# Patient Record
Sex: Male | Born: 1965 | Race: White | Hispanic: No | Marital: Married | Smoking: Former smoker
Health system: Southern US, Community
[De-identification: ages and names within clinical notes are randomized; demographics above are authoritative.]

## PROBLEM LIST (undated history)

## (undated) DIAGNOSIS — I10 Essential (primary) hypertension: Secondary | ICD-10-CM

## (undated) DIAGNOSIS — F419 Anxiety disorder, unspecified: Secondary | ICD-10-CM

## (undated) DIAGNOSIS — E785 Hyperlipidemia, unspecified: Secondary | ICD-10-CM

## (undated) DIAGNOSIS — K269 Duodenal ulcer, unspecified as acute or chronic, without hemorrhage or perforation: Secondary | ICD-10-CM

## (undated) DIAGNOSIS — T7840XA Allergy, unspecified, initial encounter: Secondary | ICD-10-CM

## (undated) HISTORY — DX: Allergy, unspecified, initial encounter: T78.40XA

## (undated) HISTORY — DX: Anxiety disorder, unspecified: F41.9

## (undated) HISTORY — DX: Duodenal ulcer, unspecified as acute or chronic, without hemorrhage or perforation: K26.9

## (undated) HISTORY — DX: Hyperlipidemia, unspecified: E78.5

## (undated) HISTORY — DX: Essential (primary) hypertension: I10

## (undated) HISTORY — PX: KNEE ARTHROPLASTY: SHX992

## (undated) HISTORY — PX: REPLACEMENT TOTAL KNEE: SUR1224

---

## 2000-08-17 HISTORY — PX: KIDNEY STONE SURGERY: SHX686

## 2004-05-29 ENCOUNTER — Ambulatory Visit: Payer: Self-pay | Admitting: Urology

## 2004-06-25 ENCOUNTER — Ambulatory Visit: Payer: Self-pay | Admitting: Internal Medicine

## 2005-04-02 ENCOUNTER — Ambulatory Visit: Payer: Self-pay | Admitting: Family Medicine

## 2005-07-15 ENCOUNTER — Ambulatory Visit: Payer: Self-pay | Admitting: Family Medicine

## 2006-01-27 ENCOUNTER — Ambulatory Visit: Payer: Self-pay | Admitting: Family Medicine

## 2006-07-02 ENCOUNTER — Ambulatory Visit: Payer: Self-pay | Admitting: Family Medicine

## 2006-09-06 ENCOUNTER — Ambulatory Visit: Payer: Self-pay | Admitting: Family Medicine

## 2006-10-29 ENCOUNTER — Ambulatory Visit: Payer: Self-pay | Admitting: Family Medicine

## 2006-11-08 ENCOUNTER — Ambulatory Visit: Payer: Self-pay | Admitting: Family Medicine

## 2007-02-22 ENCOUNTER — Encounter: Payer: Self-pay | Admitting: Family Medicine

## 2007-02-22 DIAGNOSIS — F341 Dysthymic disorder: Secondary | ICD-10-CM | POA: Insufficient documentation

## 2007-02-22 DIAGNOSIS — T7840XA Allergy, unspecified, initial encounter: Secondary | ICD-10-CM | POA: Insufficient documentation

## 2007-02-22 DIAGNOSIS — F419 Anxiety disorder, unspecified: Secondary | ICD-10-CM | POA: Insufficient documentation

## 2007-02-23 ENCOUNTER — Ambulatory Visit: Payer: Self-pay | Admitting: Family Medicine

## 2007-10-10 ENCOUNTER — Other Ambulatory Visit: Payer: Self-pay

## 2007-10-10 ENCOUNTER — Observation Stay: Payer: Self-pay | Admitting: Internal Medicine

## 2007-10-10 DIAGNOSIS — I10 Essential (primary) hypertension: Secondary | ICD-10-CM | POA: Insufficient documentation

## 2007-10-11 ENCOUNTER — Encounter: Payer: Self-pay | Admitting: Family Medicine

## 2007-10-12 ENCOUNTER — Ambulatory Visit: Payer: Self-pay | Admitting: Family Medicine

## 2007-10-12 DIAGNOSIS — R7309 Other abnormal glucose: Secondary | ICD-10-CM | POA: Insufficient documentation

## 2007-10-12 DIAGNOSIS — E785 Hyperlipidemia, unspecified: Secondary | ICD-10-CM | POA: Insufficient documentation

## 2007-10-13 ENCOUNTER — Encounter: Payer: Self-pay | Admitting: Family Medicine

## 2007-11-21 ENCOUNTER — Ambulatory Visit: Payer: Self-pay | Admitting: Family Medicine

## 2007-11-21 LAB — CONVERTED CEMR LAB
BUN: 16 mg/dL (ref 6–23)
CO2: 30 meq/L (ref 19–32)
Calcium: 8.6 mg/dL (ref 8.4–10.5)
Chloride: 108 meq/L (ref 96–112)
Creatinine, Ser: 0.9 mg/dL (ref 0.4–1.5)
GFR calc Af Amer: 118 mL/min
GFR calc non Af Amer: 98 mL/min
Glucose, Bld: 89 mg/dL (ref 70–99)
Potassium: 4.3 meq/L (ref 3.5–5.1)
Sodium: 141 meq/L (ref 135–145)

## 2007-12-02 ENCOUNTER — Telehealth: Payer: Self-pay | Admitting: Family Medicine

## 2007-12-09 ENCOUNTER — Ambulatory Visit: Payer: Self-pay | Admitting: Family Medicine

## 2008-01-11 ENCOUNTER — Telehealth (INDEPENDENT_AMBULATORY_CARE_PROVIDER_SITE_OTHER): Payer: Self-pay | Admitting: *Deleted

## 2008-03-19 ENCOUNTER — Telehealth (INDEPENDENT_AMBULATORY_CARE_PROVIDER_SITE_OTHER): Payer: Self-pay | Admitting: *Deleted

## 2008-04-30 ENCOUNTER — Telehealth: Payer: Self-pay | Admitting: Family Medicine

## 2008-06-28 ENCOUNTER — Ambulatory Visit: Payer: Self-pay | Admitting: Family Medicine

## 2008-07-26 ENCOUNTER — Ambulatory Visit: Payer: Self-pay | Admitting: Family Medicine

## 2008-08-17 LAB — HM HEPATITIS C SCREENING LAB: HM Hepatitis Screen: NEGATIVE

## 2008-08-17 LAB — HM HIV SCREENING LAB: HM HIV Screening: NEGATIVE

## 2009-02-04 ENCOUNTER — Ambulatory Visit: Payer: Self-pay | Admitting: Family Medicine

## 2009-02-25 ENCOUNTER — Telehealth: Payer: Self-pay | Admitting: Family Medicine

## 2009-02-27 ENCOUNTER — Ambulatory Visit: Payer: Self-pay | Admitting: Family Medicine

## 2009-03-29 ENCOUNTER — Ambulatory Visit: Payer: Self-pay | Admitting: Family Medicine

## 2009-04-01 ENCOUNTER — Ambulatory Visit: Payer: Self-pay | Admitting: Cardiology

## 2009-07-26 ENCOUNTER — Telehealth: Payer: Self-pay | Admitting: Family Medicine

## 2009-09-02 ENCOUNTER — Telehealth: Payer: Self-pay | Admitting: Family Medicine

## 2009-09-05 ENCOUNTER — Ambulatory Visit: Payer: Self-pay | Admitting: Family Medicine

## 2009-09-26 ENCOUNTER — Ambulatory Visit: Payer: Self-pay | Admitting: Family Medicine

## 2010-01-29 ENCOUNTER — Telehealth: Payer: Self-pay | Admitting: Family Medicine

## 2010-03-25 ENCOUNTER — Encounter (INDEPENDENT_AMBULATORY_CARE_PROVIDER_SITE_OTHER): Payer: Self-pay | Admitting: *Deleted

## 2010-07-16 ENCOUNTER — Ambulatory Visit: Payer: Self-pay | Admitting: Family Medicine

## 2010-07-23 ENCOUNTER — Telehealth: Payer: Self-pay | Admitting: Family Medicine

## 2010-08-01 ENCOUNTER — Ambulatory Visit: Payer: Self-pay | Admitting: Family Medicine

## 2010-09-12 ENCOUNTER — Other Ambulatory Visit: Payer: Self-pay | Admitting: Family Medicine

## 2010-09-12 ENCOUNTER — Ambulatory Visit
Admission: RE | Admit: 2010-09-12 | Discharge: 2010-09-12 | Payer: Self-pay | Source: Home / Self Care | Attending: Family Medicine | Admitting: Family Medicine

## 2010-09-12 ENCOUNTER — Encounter: Payer: Self-pay | Admitting: Family Medicine

## 2010-09-12 LAB — LDL CHOLESTEROL, DIRECT: Direct LDL: 165.2 mg/dL

## 2010-09-12 LAB — RENAL FUNCTION PANEL
Albumin: 3.9 g/dL (ref 3.5–5.2)
BUN: 23 mg/dL (ref 6–23)
CO2: 29 mEq/L (ref 19–32)
Calcium: 8.6 mg/dL (ref 8.4–10.5)
Chloride: 104 mEq/L (ref 96–112)
Creatinine, Ser: 0.8 mg/dL (ref 0.4–1.5)
GFR: 107.45 mL/min (ref >60.00)
Glucose, Bld: 85 mg/dL (ref 70–99)
Phosphorus: 3.5 mg/dL (ref 2.3–4.6)
Potassium: 4.5 mEq/L (ref 3.5–5.1)
Sodium: 138 mEq/L (ref 135–145)

## 2010-09-12 LAB — CBC WITH DIFFERENTIAL/PLATELET
Basophils Absolute: 0 10*3/uL (ref 0.0–0.1)
Basophils Relative: 0.5 % (ref 0.0–3.0)
Eosinophils Absolute: 0.1 10*3/uL (ref 0.0–0.7)
Eosinophils Relative: 2.7 % (ref 0.0–5.0)
HCT: 41.8 % (ref 39.0–52.0)
Hemoglobin: 14.5 g/dL (ref 13.0–17.0)
Lymphocytes Relative: 39.3 % (ref 12.0–46.0)
Lymphs Abs: 2 10*3/uL (ref 0.7–4.0)
MCHC: 34.8 g/dL (ref 30.0–36.0)
MCV: 96.7 fl (ref 78.0–100.0)
Monocytes Absolute: 0.4 10*3/uL (ref 0.1–1.0)
Monocytes Relative: 7.1 % (ref 3.0–12.0)
Neutro Abs: 2.5 10*3/uL (ref 1.4–7.7)
Neutrophils Relative %: 50.4 % (ref 43.0–77.0)
Platelets: 159 10*3/uL (ref 150.0–400.0)
RBC: 4.32 Mil/uL (ref 4.22–5.81)
RDW: 11.9 % (ref 11.5–14.6)
WBC: 5 10*3/uL (ref 4.5–10.5)

## 2010-09-12 LAB — HEPATIC FUNCTION PANEL
ALT: 14 U/L (ref 0–53)
AST: 23 U/L (ref 0–37)
Albumin: 3.9 g/dL (ref 3.5–5.2)
Alkaline Phosphatase: 63 U/L (ref 39–117)
Bilirubin, Direct: 0.1 mg/dL (ref 0.0–0.3)
Total Bilirubin: 1.5 mg/dL — ABNORMAL HIGH (ref 0.3–1.2)
Total Protein: 6.2 g/dL (ref 6.0–8.3)

## 2010-09-12 LAB — LIPID PANEL
Cholesterol: 234 mg/dL — ABNORMAL HIGH (ref 0–200)
HDL: 45.6 mg/dL (ref >39.00)
Total CHOL/HDL Ratio: 5
Triglycerides: 133 mg/dL (ref 0.0–149.0)
VLDL: 26.6 mg/dL (ref 0.0–40.0)

## 2010-09-12 LAB — MICROALBUMIN / CREATININE URINE RATIO
Creatinine,U: 109.5 mg/dL
Microalb Creat Ratio: 0.5 mg/g (ref 0.0–30.0)
Microalb, Ur: 0.5 mg/dL (ref 0.0–1.9)

## 2010-09-12 LAB — TSH: TSH: 1.3 u[IU]/mL (ref 0.35–5.50)

## 2010-09-14 LAB — CONVERTED CEMR LAB: Creatinine, Ser: 0.8 mg/dL (ref 0.4–1.5)

## 2010-09-16 LAB — CONVERTED CEMR LAB: Vit D, 25-Hydroxy: 30 ng/mL (ref 30–89)

## 2010-09-16 NOTE — Assessment & Plan Note (Signed)
Summary: MED REFILL/DLO   Vital Signs:  Patient profile:   45 year old male Height:      67.75 inches Weight:      239.25 pounds BMI:     36.78 Temp:     98.9 degrees F oral Pulse rate:   68 / minute Pulse rhythm:   regular BP sitting:   132 / 80  (left arm) Cuff size:   large  Vitals Entered By: Linde Gillis CMA Duncan Dull) (July 16, 2010 8:14 AM) CC: medication refill   History of Present Illness: Pt here for followup for medication refill. He feels good after taking off a few days. His right knee bothers him. He does lots of hunting and bothers him when doing that. He also has allergies that sometimes bother him in the woods...he takes OTC allegra with good results. He uses IBP one daily. His hands hurt him, He has one spot on his right middle finger. He opened it up and got discharge. It is now healed over. He still takes Alprazolam infrequently for "when I'm about to blow"  not very frequently. His script for this is years old.  Problems Prior to Update: 1)  Rotator Cuff Injury, Left Shoulder  (ICD-726.10) 2)  Elbow Pain, Left  (ICD-719.42) 3)  Cough  (ICD-786.2) 4)  Lung Nodule Lll  (ICD-518.89) 5)  Uri  (ICD-465.9) 6)  Tick Bite  (ICD-E906.4) 7)  Chest Pain  (ICD-786.50) 8)  L Ankle Dislocation  (ICD-755.69) 9)  Hyperlipidemia  (ICD-272.4) 10)  Hyperglycemia  (ICD-790.29) 11)  Essential Hypertension, Benign  (ICD-401.1) 12)  Sprain/strain, Lumbosacral  (ICD-846.0) 13)  Allergy  (ICD-995.3) 14)  Anxiety Depression  (ICD-300.4) 15)  Symptoms/syndromes Nec/nos, Special (AGITATION)  (ICD-307.9)  Medications Prior to Update: 1)  Xanax 0.25 Mg Tabs (Alprazolam) .Marland Kitchen.. 1 Every 6 Hours As Needed Anxiety By Mouth 2)  Fluoxetine Hcl 20 Mg  Caps (Fluoxetine Hcl) .Marland Kitchen.. 1 Daily By Mouth Once Daily 3)  Allegra 180 Mg  Tabs (Fexofenadine Hcl) .Marland Kitchen.. 1 Tablet Daily By Mouth 4)  Lisinopril 5 Mg  Tabs (Lisinopril) .... Take 1 Tablet By Mouth Once A Day 5)  Ibuprofen 800 Mg Tabs  (Ibuprofen) .Marland Kitchen.. 1 By Mouth Three Times A Day As Needed Pain 6)  Vicodin 5-500 Mg Tabs (Hydrocodone-Acetaminophen) .... One Tab By Mouth At Night For Pain  Current Medications (verified): 1)  Xanax 0.25 Mg Tabs (Alprazolam) .Marland Kitchen.. 1 Every 6 Hours As Needed Anxiety By Mouth 2)  Fluoxetine Hcl 20 Mg  Caps (Fluoxetine Hcl) .Marland Kitchen.. 1 Daily By Mouth Once Daily 3)  Allegra 180 Mg  Tabs (Fexofenadine Hcl) .Marland Kitchen.. 1 Tablet Daily By Mouth 4)  Lisinopril 5 Mg  Tabs (Lisinopril) .... Take 1 Tablet By Mouth Once A Day 5)  Ibuprofen 800 Mg Tabs (Ibuprofen) .Marland Kitchen.. 1 By Mouth Three Times A Day As Needed Pain  Allergies: 1)  ! Codeine Sulfate 2)  ! Toradol  Physical Exam  General:  Well-developed,well-nourished,in no acute distress; alert,appropriate and cooperative throughout examination, mildly obese. Head:  Normocephalic and atraumatic without obvious abnormalities. No apparent alopecia or balding. Sinuses nontender. Eyes:  Conjunctiva clear bilaterally.  Ears:  External ear exam shows no significant lesions or deformities.  Otoscopic examination reveals clear canals, tympanic membranes are intact bilaterally without bulging, retraction, inflammation or discharge. Hearing is grossly normal bilaterally. Nose:  Nares minimally inflamed. Mouth:  Oral mucosa and oropharynx without lesions or exudates.  Teeth in good repair.  Neck:  No deformities, masses, or tenderness  noted. Lungs:  Normal respiratory effort, chest expands symmetrically. Lungs are clear to auscultation, no crackles or wheezes. Heart:  Normal rate and regular rhythm. S1 and S2 normal without gallop, murmur, click, rub or other extra sounds.   Impression & Recommendations:  Problem # 1:  ESSENTIAL HYPERTENSION, BENIGN (ICD-401.1) Assessment Unchanged  His updated medication list for this problem includes:    Lisinopril 5 Mg Tabs (Lisinopril) .Marland Kitchen... Take 1 tablet by mouth once a day  BP today: 132/80 Prior BP: 120/82 (09/26/2009)  Labs  Reviewed: K+: 4.3 (11/21/2007) Creat: : 0.8 (03/29/2009)     Problem # 2:  ROTATOR CUFF INJURY, LEFT SHOULDER (ICD-726.10) Assessment: Improved Shoulder back to normal.  Problem # 3:  ALLERGY (ICD-995.3) Assessment: Unchanged Stable when takes allegra when needed.  Problem # 4:  ANXIETY DEPRESSION (ICD-300.4) Assessment: Unchanged Has occas exacerbations but he feels Prozac doing good job on daily basis with occas Xanax as needed. Discussed habituation.  Complete Medication List: 1)  Xanax 0.25 Mg Tabs (Alprazolam) .Marland Kitchen.. 1 every 6 hours as needed anxiety by mouth 2)  Fluoxetine Hcl 20 Mg Caps (Fluoxetine hcl) .Marland Kitchen.. 1 daily by mouth once daily 3)  Allegra 180 Mg Tabs (Fexofenadine hcl) .Marland Kitchen.. 1 tablet daily by mouth 4)  Lisinopril 5 Mg Tabs (Lisinopril) .... Take 1 tablet by mouth once a day 5)  Ibuprofen 800 Mg Tabs (Ibuprofen) .Marland Kitchen.. 1 by mouth three times a day as needed pain  Patient Instructions: 1)  RTC for Comp Exam after the first of the year, labs prior Prescriptions: XANAX 0.25 MG TABS (ALPRAZOLAM) 1 every 6 hours as needed anxiety by mouth  #30 x 0   Entered and Authorized by:   Shaune Leeks MD   Signed by:   Shaune Leeks MD on 07/16/2010   Method used:   Print then Give to Patient   RxID:   [id number] LISINOPRIL 5 MG  TABS (LISINOPRIL) Take 1 tablet by mouth once a day  #30 Tablet x 11   Entered and Authorized by:   Shaune Leeks MD   Signed by:   Shaune Leeks MD on 07/16/2010   Method used:   Electronically to        [facility]. [address] [phone]* (retail)       [address] [place], [place]  [address]       Ph: [phone]       Fax: [phone]   RxID:   [id number] FLUOXETINE HCL 20 MG  CAPS (FLUOXETINE HCL) 1 daily by mouth once daily  #30 Capsule x 11   Entered and Authorized by:   Shaune Leeks MD   Signed by:   Shaune Leeks MD on 07/16/2010   Method used:   Electronically to        [facility].  [address] [phone]* (retail)       [address] [place], [place]  [id number]       Ph: [phone]       Fax: [phone]   RxID:   [id number]    Orders Added: 1)  Est. Patient Level III [61607]    Current Allergies (reviewed today): ! CODEINE SULFATE ! TORADOL

## 2010-09-16 NOTE — Assessment & Plan Note (Signed)
Summary: Larey Seat, injured elbow, needs x-ray /lsf   Vital Signs:  Patient profile:   45 year old male Height:      67.75 inches Weight:      236.4 pounds BMI:     36.34 Temp:     98.5 degrees F oral Pulse rate:   64 / minute Pulse rhythm:   regular BP sitting:   120 / 82  (left arm) Cuff size:   regular  Vitals Entered By: Benny Lennert CMA Duncan Dull) (September 05, 2009 3:08 PM)  History of Present Illness: Chief complaint left arm pain after fall  Left arm pain:  Was up on a roof take, was holding a slip knowt and malfunction, and reports that the rope was around his arm.   Concrete broke loose and slip knot broke and he lunged forward and hit at the same time. Wedge his elbow and his knee.   Felt like he pulled his bicep and tricep got pulled   now he is having pain with abducting and flexing his arm, some pain with motion  at the elbow minimally, and also has an abrasion at his elbow.  He also has some mild swelling around this abrasion.  The patient has full motion at the elbow.  He has no complaints of pain in his hand or wrist, has minimal or no pain in his knee.  He denies prior operative intervention in the affected extremity.  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  Allergies: 1)  ! Codeine Sulfate 2)  ! Toradol  Past History:  Past medical, surgical, family and social histories (including risk factors) reviewed, and no changes noted (except as noted below).  Past Medical History: ALLERGY (ICD-995.3) ANXIETY DEPRESSION (ICD-300.4)  Past Surgical History: Reviewed history from 10/13/2007 and no changes required. Hemmoroidectomy  2001 KIDNEY STONES X 2  CYSTOSCOPY W/STENT last 2002 HOSP [facility] CP R/O'D HTN HEADACHE HYPERGLYCEMIA OBESITY ^CHOL ANXIETY 2/23-2/24/2009  Family History: Reviewed history from 02/22/2007 and no changes required. Father: DECEASED 2 YOA CANCER OF  LUNG (SMOKER) Mother: ALIVE 77 YOA = ALLERGIES Siblings: 1 BROTHER ALIVE 2 SISTERS ? OLDEST SISTER : CAD / HTN CV:+ OLDEST SISTER HBP: + OLDEST SISTER DM: NEGATIVE GOUT/ARTHRITIS:  PROSTATE CANCER: + FATHER LUNG BREAST/OVARIAN/UTERINE CANCER:  COLON: CANCER: DEPRESSION: NEGATIVE ETOH/DRUG ABUSE: NEGATIVE OTHER: NEGATIVE STROKE  Social History: Reviewed history from 02/22/2007 and no changes required. Marital Status: MarriedLIVES WITH WIFE Children: 3 CHILDREN Occupation: SUPER: Product/process development scientist  Review of Systems       REVIEW OF SYSTEMS  GEN: No systemic complaints, no fevers, chills, sweats, or other acute illnesses MSK: Detailed in the HPI GI: tolerating PO intake without difficulty Neuro: No numbness, parasthesias, or tingling associated. Otherwise the pertinent positives of the ROS are noted above.    Physical Exam  General:  GEN: Well-developed,well-nourished,in no acute distress; alert,appropriate and cooperative throughout examination HEENT: Normocephalic and atraumatic without obvious abnormalities. No apparent alopecia or balding. Ears, externally no deformities PULM: Breathing comfortably in no respiratory distress EXT: No clubbing, cyanosis, or edema PSYCH: Normally interactive. Cooperative during the interview. Pleasant. Friendly and conversant. Not anxious or depressed appearing. Normal, full affect.  Msk:  right-sided upper extremity, full range of motion, nontender throughout, strength 5/5 at the shoulder, elbow, hand and wrist.  Left hand and wrist: Nontender throughout all bony anatomy, full range of motion, good grip  and 5/5 strength testing throughout.  Nontender throughout palpation of the radius and ulna.  Elbow: there is an abrasion on the left olecranon. This is surrounded by some mild swelling.  Patient has full range of motion  at the elbow with flexion and extension as well as supination and pronation. Nontender at the radial head. Nontender at  the olecranon. NontenderECRB., medial and lateral epicondyles  less shoulder: Mildly tender in the bicipital groove, tender at the supraspinous insertion.  There is some tenderness with  range of motion in abduction and flexion plane, however full range of motion is maintained.   Negative drop test. Positive Neer test,  positive Leanord Asal test. Negative a.c. crossover compression test. Positive speed's test   Positive Jobe test.  Strength testing: Abduction 4+/5, external rotation, and internal rotation 4+/5. These maneuvers cause some mild pain.  There is no bruising throughout. Neurologic:  neurovascularly intact   Impression & Recommendations:  Problem # 1:  ROTATOR CUFF INJURY, LEFT SHOULDER (ICD-726.10) Assessment New I suspect he has a mild, minor rotator cuff tear. He has no strength deficit and a negative drop test.  Would limit his heavy lifting for about 3 weeks, and I gave him some activities for maintaining his range of motion.  I suspect he will do well with time with conservative management  Time spent in eval, discussion of anatomy, work limitations, recovery, rehab  Problem # 2:  ELBOW PAIN, LEFT (JYN-829.56) Assessment: New Clinically there is not fracture  suspect bony contusion as well as soft tissue contusion at elbow  Complete Medication List: 1)  Xanax 0.25 Mg Tabs (Alprazolam) .Marland Kitchen.. 1 every 6 hours as needed anxiety by mouth 2)  Fluoxetine Hcl 20 Mg Caps (Fluoxetine hcl) .Marland Kitchen.. 1 daily by mouth once daily 3)  Allegra 180 Mg Tabs (Fexofenadine hcl) .Marland Kitchen.. 1 tablet daily by mouth 4)  Lisinopril 5 Mg Tabs (Lisinopril) .... Take 1 tablet by mouth once a day 5)  Hydrocodone-acetaminophen 5-500 Mg Tabs (Hydrocodone-acetaminophen) .Marland Kitchen.. 1 by mouth q 6 hours as needed pain 6)  Ibuprofen 800 Mg Tabs (Ibuprofen) .Marland Kitchen.. 1 by mouth three times a day as needed pain Prescriptions: IBUPROFEN 800 MG TABS (IBUPROFEN) 1 by mouth three times a day as needed pain  #90  x 0   Entered and Authorized by:   Hannah Beat MD   Signed by:   Hannah Beat MD on 09/05/2009   Method used:   Print then Give to Patient   RxID:   [id number] HYDROCODONE-ACETAMINOPHEN 5-500 MG TABS (HYDROCODONE-ACETAMINOPHEN) 1 by mouth q 6 hours as needed pain  #30 x 0   Entered and Authorized by:   Hannah Beat MD   Signed by:   Hannah Beat MD on 09/05/2009   Method used:   Print then Give to Patient   RxID:   [id number]   Current Allergies (reviewed today): ! CODEINE SULFATE ! TORADOL

## 2010-09-16 NOTE — Letter (Signed)
Summary: Out of Work  [employer] [employer] [facility] at [facility]  [address] [place], [place] [address]   Phone: [phone]  Fax: [phone]    September 05, 2009   Employee:  ERICSON NAFZIGER    To Whom It May Concern:   For Medical reasons, limit Mr. Celani lifting at work to 3 pounds or less, but he may remain active in other activities at work, walk, supervise, as long as no upper extremity stress.  Start:   09/05/2009  End:   09/26/2009  If you need additional information, please feel free to contact our office.         Sincerely,    Hannah Beat MD

## 2010-09-16 NOTE — Assessment & Plan Note (Signed)
Summary: LEFT SHOULDER PAIN/[facility]   Vital Signs:  Patient profile:   45 year old male Weight:      236 pounds Temp:     98.5 degrees F oral Pulse rate:   60 / minute Pulse rhythm:   regular BP sitting:   120 / 82  (right arm) Cuff size:   large  Vitals Entered By: Sydell Axon LPN (September 26, 2009 10:17 AM) CC: Left shoulder and neck pain, injured shoulder at work 3 weeks ago and saw Dr. Patsy Lager for this   History of Present Illness: Pt here to discuss his left shoulder which was injured a few weeks ago and for which he saw Dr Patsy Lager. He he was diagnosed with partial tear of the rotaator cuff and was given NSAIDS, pain medicine and exercises to do to avoid adhesive capsulitis while allowint the soft tissue to heal. 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 He has since had swelling of the area in the shoulder, pulsing sensations in the area and discomfort all at night after the activity. It makes sleeping difficult. He noticed during the day this all improved as his shopulder "warms up."  Problems Prior to Update: 1)  Rotator Cuff Injury, Left Shoulder  (ICD-726.10) 2)  Elbow Pain, Left  (ICD-719.42) 3)  Cough  (ICD-786.2) 4)  Lung Nodule Lll  (ICD-518.89) 5)  Uri  (ICD-465.9) 6)  Tick Bite  (ICD-E906.4) 7)  Chest Pain  (ICD-786.50) 8)  L Ankle Dislocation  (ICD-755.69) 9)  Hyperlipidemia  (ICD-272.4) 10)  Hyperglycemia  (ICD-790.29) 11)  Essential Hypertension, Benign  (ICD-401.1) 12)  Sprain/strain, Lumbosacral  (ICD-846.0) 13)  Allergy  (ICD-995.3) 14)  Anxiety Depression  (ICD-300.4) 15)  Symptoms/syndromes Nec/nos, Special (AGITATION)  (ICD-307.9)  Medications Prior to Update: 1)  Xanax 0.25 Mg Tabs (Alprazolam) .Marland Kitchen.. 1 Every 6 Hours As Needed Anxiety By Mouth 2)  Fluoxetine Hcl 20 Mg  Caps (Fluoxetine Hcl) .Marland Kitchen.. 1 Daily By Mouth Once Daily 3)  Allegra 180 Mg  Tabs  (Fexofenadine Hcl) .Marland Kitchen.. 1 Tablet Daily By Mouth 4)  Lisinopril 5 Mg  Tabs (Lisinopril) .... Take 1 Tablet By Mouth Once A Day 5)  Hydrocodone-Acetaminophen 5-500 Mg Tabs (Hydrocodone-Acetaminophen) .Marland Kitchen.. 1 By Mouth Q 6 Hours As Needed Pain 6)  Ibuprofen 800 Mg Tabs (Ibuprofen) .Marland Kitchen.. 1 By Mouth Three Times A Day As Needed Pain  Allergies: 1)  ! Codeine Sulfate 2)  ! Toradol  Physical Exam  General:  GEN: Well-developed,well-nourished,in no acute distress; alert,appropriate and cooperative throughout examination HEENT: Normocephalic and atraumatic without obvious abnormalities. No apparent alopecia or balding. Ears, externally no deformities PULM: Breathing comfortably in no respiratory distress EXT: No clubbing, cyanosis, or edema PSYCH: Normally interactive. Cooperative during the interview. Pleasant. Friendly and conversant. Not anxious or depressed appearing. Normal, full affect.  Head:  Normocephalic and atraumatic without obvious abnormalities. No apparent alopecia or balding. Sinuses nontender. Eyes:  Conjunctiva clear bilaterally.  Ears:  External ear exam shows no significant lesions or deformities.  Otoscopic examination reveals clear canals, tympanic membranes are intact bilaterally without bulging, retraction, inflammation or discharge. Hearing is grossly normal bilaterally. Nose:  Nares minimally inflamed. Mouth:  Oral mucosa and oropharynx without lesions or exudates.  Teeth in good repair.  Neck:  No deformities, masses, or tenderness noted. Lungs:  Normal respiratory effort, chest expands symmetrically. Lungs are clear to auscultation, no crackles or wheezes. Heart:  Normal rate and regular rhythm. S1 and S2 normal without gallop, murmur, click, rub or other extra sounds. Msk:  L shoulder for minimally decreased ROM but discomfort with extremes esp behind the back, no swelling or echynmosis noted.   Impression & Recommendations:  Problem # 1:  ROTATOR CUFF INJURY, LEFT  SHOULDER (ICD-726.10) Assessment Unchanged Had improved but no reexacerbated with extreme use. Back of and allow to heal but cont exercises to avoid capsulitis. Use IBP regularly for next 3 weeks minimum and use Vicodin at nite as needed. Script written.  Complete Medication List: 1)  Xanax 0.25 Mg Tabs (Alprazolam) .Marland Kitchen.. 1 every 6 hours as needed anxiety by mouth 2)  Fluoxetine Hcl 20 Mg Caps (Fluoxetine hcl) .Marland Kitchen.. 1 daily by mouth once daily 3)  Allegra 180 Mg Tabs (Fexofenadine hcl) .Marland Kitchen.. 1 tablet daily by mouth 4)  Lisinopril 5 Mg Tabs (Lisinopril) .... Take 1 tablet by mouth once a day 5)  Ibuprofen 800 Mg Tabs (Ibuprofen) .Marland Kitchen.. 1 by mouth three times a day as needed pain 6)  Vicodin 5-500 Mg Tabs (Hydrocodone-acetaminophen) .... One tab by mouth at night for pain Prescriptions: VICODIN 5-500 MG TABS (HYDROCODONE-ACETAMINOPHEN) one tab by mouth at night for pain  #30 x 1   Entered and Authorized by:   Shaune Leeks MD   Signed by:   Shaune Leeks MD on 09/26/2009   Method used:   Print then Give to Patient   RxID:   [id number]   Current Allergies (reviewed today): ! CODEINE SULFATE ! TORADOL

## 2010-09-16 NOTE — Letter (Signed)
Summary: Nadara Eaton letter  [facility] at [facility]  [address] [place], [place] [address]   Phone: [phone]  Fax: [phone]       03/25/2010 MRN: [id number]  MARCIO HOQUE [address] [place], [place]  [address]  Dear Mr. [facility] announce the retirement of Arta Silence, M.D., from full-time practice at the [facility] office effective February 13, 2010 and his plans of returning part-time.  It is important to Dr. Hetty Ely and to our practice that you understand that [facility] has seven physicians in our office for your health care needs.  We will continue to offer the same exceptional care that you have today.    Dr. Hetty Ely has spoken to many of you about his plans for retirement and returning part-time in the fall.   We will continue to work with you through the transition to schedule appointments for you in the office and meet the high standards that [place] [facility] is committed to.   Again, it is with great pleasure that we share the news that Dr. Hetty Ely will return to [facility] at [facility] in October of 2011 with a reduced schedule.    If you have any questions, or would like to request an appointment with one of our physicians, please call us at [phone] and press the option for Scheduling an appointment.  We take pleasure in providing you with excellent patient care and look forward to seeing you at your next office visit.  Our [facility] Physicians are:  Tillman Abide, M.D. Laurita Quint, M.D. Roxy Manns, M.D. Kerby Nora, M.D. Hannah Beat, M.D. Ruthe Mannan, M.D. We proudly welcomed Raechel Ache, M.D. and Eustaquio Boyden, M.D. to the practice in July/August 2011.  Sincerely,  [facility]

## 2010-09-16 NOTE — Progress Notes (Signed)
Summary: Rx Ibuprofen  Phone Note Refill Request Call back at [phone] Message from:  [facility] on July 23, 2010 8:49 AM  Refills Requested: Medication #1:  IBUPROFEN 800 MG TABS 1 by mouth three times a day as needed pain.   Last Refilled: 09/05/2009  Method Requested: Electronic Initial call taken by: Sydell Axon LPN,  July 23, 2010 8:49 AM    Prescriptions: IBUPROFEN 800 MG TABS (IBUPROFEN) 1 by mouth three times a day as needed pain  #90 x 2   Entered and Authorized by:   Shaune Leeks MD   Signed by:   Shaune Leeks MD on 07/23/2010   Method used:   Electronically to        [facility]. [address] [phone]* (retail)       [address] [place], [place]  [id number]       Ph: [phone]       Fax: [phone]   RxID:   [id number]

## 2010-09-16 NOTE — Progress Notes (Signed)
Summary: refill request for prozac  Phone Note Refill Request   Refills Requested: Medication #1:  FLUOXETINE HCL 20 MG  CAPS 1 daily by mouth   Last Refilled: 07/26/2009 Electronic request from [facility] [address]  Initial call taken by: Lowella Petties CMA,  September 02, 2009 4:12 PM  Follow-up for Phone Call        px written on EMR for call in 1 mo in Dr Lorenza Chick absence  Follow-up by: Judith Part MD,  September 02, 2009 5:18 PM  Additional Follow-up for Phone Call Additional follow up Details #1::        [facility]. Additional Follow-up by: Lowella Petties CMA,  September 02, 2009 5:26 PM    New/Updated Medications: FLUOXETINE HCL 20 MG  CAPS (FLUOXETINE HCL) 1 daily by mouth once daily Prescriptions: FLUOXETINE HCL 20 MG  CAPS (FLUOXETINE HCL) 1 daily by mouth once daily  #30 x 0   Entered and Authorized by:   Judith Part MD   Signed by:   Judith Part MD on 09/02/2009   Method used:   Telephoned to ...       [facility] [address] [phone]* (retail)       [address] [place], [place]  [id number]       Ph: [phone]       Fax: [phone]   RxID:   [id number]

## 2010-09-16 NOTE — Progress Notes (Signed)
Summary: requests patches for motion sickness  Phone Note Call from Patient Call back at [phone]   Caller: Patient Call For: Shaune Leeks MD Summary of Call: Pt is going deep see fishing and is asking if he can have some transderm scop patches called to [facility] [address]  He will be fishing for 2 days. Initial call taken by: Lowella Petties CMA,  January 29, 2010 8:12 AM  Follow-up for Phone Call        Just called in script for wife yesterday and they both will only need one patch. If needed, you cann call in Scopolamine Transderm Scop one patch appled every 3 days as needed motion sicness 1/0RF. Follow-up by: Shaune Leeks MD,  January 29, 2010 8:16 AM  Additional Follow-up for Phone Call Additional follow up Details #1::        Advised pt. Additional Follow-up by: Lowella Petties CMA,  January 29, 2010 9:47 AM

## 2010-09-17 ENCOUNTER — Ambulatory Visit: Admit: 2010-09-17 | Payer: Self-pay | Admitting: Family Medicine

## 2010-09-17 ENCOUNTER — Encounter: Payer: Self-pay | Admitting: Family Medicine

## 2010-09-17 ENCOUNTER — Encounter (INDEPENDENT_AMBULATORY_CARE_PROVIDER_SITE_OTHER): Payer: BC Managed Care – PPO | Admitting: Family Medicine

## 2010-09-17 DIAGNOSIS — Z0000 Encounter for general adult medical examination without abnormal findings: Secondary | ICD-10-CM

## 2010-09-18 NOTE — Assessment & Plan Note (Signed)
Summary: ?SINUS INFECTION/CLE   Vital Signs:  Patient profile:   45 year old male Weight:      236 pounds Temp:     98.7 degrees F oral BP sitting:   120 / 80  (left arm) Cuff size:   large  Vitals Entered By: Mervin Hack CMA Duncan Dull) (August 01, 2010 9:05 AM) CC: sinus infection   History of Present Illness: "I think I got a sinus infection."  Had been planing some cedar prev.  Now with pressure in top of forehead and facial pressure.  "it's going down in my chest. I feel lousy."  No fevers.  Cough, +sputum with light green sputum.  About 1 week of symptoms. Ears feel stopped up.  Can't lay flat due to congestion.  Nauseated from the drainage.    Allergies: 1)  ! Codeine Sulfate 2)  ! 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  Otherwise negative.    Physical Exam  General:  GEN: nad, alert and oriented HEENT: mucous membranes moist, TM w/o erythema, nasal epithelium injected, OP with cobblestoning NECK: supple w/o LA CV: rrr. PULM: ctab, no inc wob ABD: soft, +bs EXT: no edema  frontal and max sinus tender to palpation bilaterally   Impression & Recommendations:  Problem # 1:  SINUSITIS - ACUTE-NOS (ICD-461.9) Nontoxic.  Start antibiotics and cough meds with sedation caution.  follow up as needed.  okay for outpatient follow up.  He agrees.  Supporitve measures o/w  His updated medication list for this problem includes:    Amoxicillin 875 Mg Tabs (Amoxicillin) .Marland Kitchen... 1 by mouth two times a day    Hydrocodone-homatropine 5-1.5 Mg/21ml Syrp (Hydrocodone-homatropine) .Marland KitchenMarland KitchenMarland KitchenMarland Kitchen 5 ml by mouth three times a day as needed for cough with sedation caution  Complete Medication List: 1)  Xanax 0.25 Mg Tabs (Alprazolam) .Marland Kitchen.. 1 every 6 hours as needed anxiety by mouth 2)  Fluoxetine Hcl 20 Mg Caps (Fluoxetine hcl) .Marland Kitchen.. 1 daily by mouth  once daily 3)  Allegra 180 Mg Tabs (Fexofenadine hcl) .Marland Kitchen.. 1 tablet daily by mouth 4)  Lisinopril 5 Mg Tabs (Lisinopril) .... Take 1 tablet by mouth once a day 5)  Ibuprofen 800 Mg Tabs (Ibuprofen) .Marland Kitchen.. 1 by mouth three times a day as needed pain 6)  Amoxicillin 875 Mg Tabs (Amoxicillin) .Marland Kitchen.. 1 by mouth two times a day 7)  Hydrocodone-homatropine 5-1.5 Mg/86ml Syrp (Hydrocodone-homatropine) .... 5 ml by mouth three times a day as needed for cough with sedation caution  Patient Instructions: 1)  Get plenty of rest, drink lots of clear liquids, and use Tylenol or Ibuprofen for fever and comfort. Start the antibiotics today and use the cough medicine as needed.  It can make you drowsy.  Take care.  Prescriptions: HYDROCODONE-HOMATROPINE 5-1.5 MG/5ML SYRP (HYDROCODONE-HOMATROPINE) 5 ml by mouth three times a day as needed for cough with sedation caution  #8oz x 0   Entered and Authorized by:   Crawford Givens MD   Signed by:   Crawford Givens MD on 08/01/2010   Method used:   Print then Give to Patient   RxID:   [id number] AMOXICILLIN 875 MG TABS (AMOXICILLIN) 1 by mouth two times a day  #20 x 0   Entered and Authorized by:   Crawford Givens MD   Signed by:   Crawford Givens MD on 08/01/2010  Method used:   Print then Give to Patient   RxID:   [id number]    Orders Added: 1)  Est. Patient Level III [28413]    Current Allergies (reviewed today): ! CODEINE SULFATE ! TORADOL

## 2010-09-24 NOTE — Assessment & Plan Note (Signed)
Summary: CPX/RBH   Vital Signs:  Patient profile:   45 year old male Height:      67.75 inches Weight:      231.25 pounds Temp:     99.0 degrees F oral Pulse rate:   84 / minute Pulse rhythm:   regular BP sitting:   136 / 82  (left arm) Cuff size:   large  Vitals Entered By: Selena Batten Dance CMA (AAMA) 2010-10-04 3:03 PM) CC: CPx   History of Present Illness: Pt here for Comp Exam. He has no complaints except joint pains.  He is doing well otherwise.  He has learned on the jobsite that if things get too tense to walk away and come back when he has cooled down. This works better than anything else and actually gets him some respect!!  Preventive Screening-Counseling & Management  Alcohol-Tobacco     Alcohol drinks/day: <1 weekend drinker     Alcohol type: beer occas ETOH     Smoking Status: quit     Packs/Day: 1991/ 15PYH  Caffeine-Diet-Exercise     Caffeine use/day: coffee, 3-4 cups in AM     Does Patient Exercise: yes     Type of exercise: walking/ elliptical/situps/crunches     Exercise (avg: min/session): >60     Times/week: 3  Problems Prior to Update: 1)  Sinusitis - Acute-nos  (ICD-461.9) 2)  Lung Nodule Lll  (ICD-518.89) 3)  Tick Bite  (ICD-E906.4) 4)  Chest Pain  (ICD-786.50) 5)  L Ankle Dislocation  (ICD-755.69) 6)  Hyperlipidemia  (ICD-272.4) 7)  Hyperglycemia  (ICD-790.29) 8)  Essential Hypertension, Benign  (ICD-401.1) 9)  Sprain/strain, Lumbosacral  (ICD-846.0) 10)  Allergy  (ICD-995.3) 11)  Anxiety Depression  (ICD-300.4) 12)  Symptoms/syndromes Nec/nos, Special (AGITATION)  (ICD-307.9)  Medications Prior to Update: 1)  Xanax 0.25 Mg Tabs (Alprazolam) .Marland Kitchen.. 1 Every 6 Hours As Needed Anxiety By Mouth 2)  Fluoxetine Hcl 20 Mg  Caps (Fluoxetine Hcl) .Marland Kitchen.. 1 Daily By Mouth Once Daily 3)  Allegra 180 Mg  Tabs (Fexofenadine Hcl) .Marland Kitchen.. 1 Tablet Daily By Mouth 4)  Lisinopril 5 Mg  Tabs (Lisinopril) .... Take 1 Tablet By Mouth Once A Day 5)  Ibuprofen 800  Mg Tabs (Ibuprofen) .Marland Kitchen.. 1 By Mouth Three Times A Day As Needed Pain 6)  Amoxicillin 875 Mg Tabs (Amoxicillin) .Marland Kitchen.. 1 By Mouth Two Times A Day 7)  Hydrocodone-Homatropine 5-1.5 Mg/54ml Syrp (Hydrocodone-Homatropine) .... 5 Ml By Mouth Three Times A Day As Needed For Cough With Sedation Caution  Current Medications (verified): 1)  Xanax 0.25 Mg Tabs (Alprazolam) .Marland Kitchen.. 1 Every 6 Hours As Needed Anxiety By Mouth 2)  Fluoxetine Hcl 20 Mg  Caps (Fluoxetine Hcl) .Marland Kitchen.. 1 Daily By Mouth Once Daily 3)  Allegra 180 Mg  Tabs (Fexofenadine Hcl) .Marland Kitchen.. 1 Tablet Daily By Mouth 4)  Lisinopril 5 Mg  Tabs (Lisinopril) .... Take 1 Tablet By Mouth Once A Day 5)  Ibuprofen 800 Mg Tabs (Ibuprofen) .Marland Kitchen.. 1 By Mouth Three Times A Day As Needed Pain  Allergies: 1)  ! Codeine Sulfate 2)  ! Toradol  Past History:  Past Medical History: Last updated: 09/05/2009 ALLERGY (ICD-995.3) ANXIETY DEPRESSION (ICD-300.4)  Past Surgical History: Last updated: 10/13/2007 Hemmoroidectomy  2001 KIDNEY STONES X 2  CYSTOSCOPY W/STENT last 2002 HOSP [facility] CP R/O'D HTN HEADACHE HYPERGLYCEMIA OBESITY ^CHOL ANXIETY 2/23-2/24/2009  Family History: Last updated: 10/04/10 Father: DECEASED 56 YOA CANCER OF LUNG (SMOKER) Mother: ALIVE 59  ALLERGIES   HIP  FX BROTHER A 54 SISTER  A 50 CAD / HTN SISTER  A 45 CV:+ OLDEST SISTER HBP: + OLDEST SISTER DM: NEGATIVE GOUT/ARTHRITIS:  PROSTATE CANCER: + FATHER LUNG BREAST/OVARIAN/UTERINE CANCER:  COLON: CANCER: DEPRESSION: NEGATIVE ETOH/DRUG ABUSE: NEGATIVE OTHER: NEGATIVE STROKE  Social History: Last updated: 09/17/2010 Marital Status: MarriedLIVES WITH WIFE Children: 3 CHILDREN 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  Risk Factors: Alcohol Use: <1 weekend drinker (09/17/2010) Caffeine Use: coffee, 3-4 cups in AM (09/17/2010) Exercise: yes (09/17/2010)  Risk Factors: Smoking Status: quit (09/17/2010) Packs/Day: 1991/ 15PYH  (09/17/2010)  Family History: Father: DECEASED 36 YOA CANCER OF LUNG (SMOKER) Mother: ALIVE 66  ALLERGIES   HIP FX BROTHER A 54 SISTER  A 50 CAD / HTN SISTER  A 45 CV:+ OLDEST SISTER HBP: + OLDEST SISTER DM: NEGATIVE GOUT/ARTHRITIS:  PROSTATE CANCER: + FATHER LUNG BREAST/OVARIAN/UTERINE CANCER:  COLON: CANCER: DEPRESSION: NEGATIVE ETOH/DRUG ABUSE: NEGATIVE OTHER: NEGATIVE STROKE  Social History: Marital Status: MarriedLIVES WITH WIFE Children: 3 CHILDREN 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 Eyes:  Complains of blurring; denies discharge and eye pain; WEARS GLASSES NOW. ENT:  Complains of decreased hearing; denies earache and ringing in ears; mildly at times. CV:  Denies difficulty breathing at night, fainting, fatigue, palpitations, shortness of breath with exertion, swelling of feet, and swelling of hands. Resp:  Complains of cough; denies shortness of breath and wheezing; occas at night, felt to be allergies from dust exposure on the job.Marland Kitchen GI:  Denies abdominal pain, bloody stools, change in bowel habits, constipation, dark tarry stools, diarrhea, indigestion, loss of appetite, nausea, vomiting, vomiting blood, and yellowish skin color. GU:  Complains of nocturia and urinary frequency; denies discharge and dysuria; twice a night. MS:  Complains of joint pain and low back pain; denies muscle aches, cramps, and stiffness; knees and back. Derm:  Denies dryness, itching, and rash. Neuro:  Denies numbness, poor balance, tingling, and tremors.  Physical Exam  General:  Well-developed,well-nourished,in no acute distress; alert,appropriate and cooperative throughout examination, mildly overweight. Head:  Normocephalic and atraumatic without obvious abnormalities. No apparent alopecia  but typical advancing male pattern  balding. Sinuses nontender. Eyes:  Conjunctiva clear bilaterally.  Ears:  External ear exam shows no significant lesions or deformities.  Otoscopic examination reveals clear canals, tympanic membranes are intact bilaterally without bulging, retraction, inflammation or discharge. Hearing is grossly normal bilaterally. Nose:  Nares minimally inflamed. Mouth:  Oral mucosa and oropharynx without lesions or exudates.  Teeth in good repair.  Neck:  No deformities, masses, or tenderness noted. Chest Wall:  No deformities, masses, tenderness or gynecomastia noted. Breasts:  No masses or gynecomastia noted Lungs:  Normal respiratory effort, chest expands symmetrically. Lungs are clear to auscultation, no crackles or wheezes. Heart:  Normal rate and regular rhythm. S1 and S2 normal without gallop, murmur, click, rub or other extra sounds. Abdomen:  Bowel sounds positive,abdomen soft and non-tender without masses, organomegaly or hernias noted. Rectal:  No external abnormalities noted. Normal sphincter tone. No rectal masses or tenderness. G neg. Genitalia:  Testes bilaterally descended without nodularity, tenderness or masses. No scrotal masses or lesions. No penis lesions or urethral discharge. Prostate:  Prostate gland firm and smooth, no enlargement, nodularity, tenderness, mass, asymmetry or induration. 20-30gms. Msk:  No deformity or scoliosis noted of thoracic or lumbar spine.  L shoulder for minimally decreased ROM but discomfort with extremes esp behind the back, no swelling or echynmosis noted. Pulses:  R and L carotid,radial,femoral,dorsalis pedis and posterior tibial pulses are full and equal bilaterally Extremities:  No clubbing, cyanosis, edema, or deformity noted with normal full range of motion of all joints except as above..   Neurologic:  neurovascularly intact Skin:  Intact without suspicious lesions or rashes. Cervical Nodes:  No lymphadenopathy  noted Inguinal Nodes:  No significant adenopathy Psych:  Cognition and judgment appear intact. Alert and cooperative with normal attention span and concentration. No apparent delusions, illusions, hallucinations. Typical intense demeanor.   Impression & Recommendations:  Problem # 1:  HEALTH MAINTENANCE EXAM (ICD-V70.0) Assessment Comment Only  Reviewed preventive care protocols, scheduled due services, and updated immunizations.  Problem # 2:  LUNG NODULE LLL (ICD-518.89) Assessment: Improved Followed by CT in 2010, 3 years after original with no change and no reason for further eval. Will remove from prob list.  Problem # 3:  HYPERLIPIDEMIA (ICD-272.4) Assessment: Unchanged LDL too high. PT declines meds and wants to work on diet and continued exercise. Labs Reviewed: SGOT: 23 (09/12/2010)   SGPT: 14 (09/12/2010)   HDL:45.60 (09/12/2010)  Chol:234 (09/12/2010)  Trig:133.0 (09/12/2010)  Problem # 4:  HYPERGLYCEMIA (ICD-790.29) Assessment: Improved  Euglycemic today.  Labs Reviewed: Creat: 0.8 (09/12/2010)     Problem # 5:  ESSENTIAL HYPERTENSION, BENIGN (ICD-401.1) Assessment: Unchanged Stable. His updated medication list for this problem includes:    Lisinopril 5 Mg Tabs (Lisinopril) .Marland Kitchen... Take 1 tablet by mouth once a day  BP today: 136/82 Prior BP: 120/80 (08/01/2010)  Labs Reviewed: K+: 4.5 (09/12/2010) Creat: : 0.8 (09/12/2010)   Chol: 234 (09/12/2010)   HDL: 45.60 (09/12/2010)   TG: 133.0 (09/12/2010)  Problem # 6:  ANXIETY DEPRESSION (ICD-300.4) Assessment: Improved Continues to get better control of himself at stressful times. Discussed. May cont sparse use of Xanax.  Complete Medication List: 1)  Xanax 0.25 Mg Tabs (Alprazolam) .Marland Kitchen.. 1 every 6 hours as needed anxiety by mouth 2)  Fluoxetine Hcl 20 Mg Caps (Fluoxetine hcl) .Marland Kitchen.. 1 daily by mouth once daily 3)  Allegra 180 Mg Tabs (Fexofenadine hcl) .Marland Kitchen.. 1 tablet daily by mouth 4)  Lisinopril 5 Mg Tabs  (Lisinopril) .... Take 1 tablet by mouth once a day 5)  Ibuprofen 800 Mg Tabs (Ibuprofen) .Marland Kitchen.. 1 by mouth three times a day as needed pain  Patient Instructions: 1)  RTC as needed. Prescriptions: XANAX 0.25 MG TABS (ALPRAZOLAM) 1 every 6 hours as needed anxiety by mouth  #30 x 0   Entered and Authorized by:   Shaune Leeks MD   Signed by:   Shaune Leeks MD on 09/17/2010   Method used:   Print then Give to Patient   RxID:   [id number]    Orders Added: 1)  Est. Patient 40-64 years [14782]    Current Allergies (reviewed today): ! CODEINE SULFATE ! TORADOL

## 2010-10-14 ENCOUNTER — Ambulatory Visit (INDEPENDENT_AMBULATORY_CARE_PROVIDER_SITE_OTHER): Payer: BC Managed Care – PPO | Admitting: Family Medicine

## 2010-10-14 ENCOUNTER — Encounter: Payer: Self-pay | Admitting: Family Medicine

## 2010-10-14 DIAGNOSIS — T169XXA Foreign body in ear, unspecified ear, initial encounter: Secondary | ICD-10-CM | POA: Insufficient documentation

## 2010-10-23 NOTE — Assessment & Plan Note (Signed)
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  No FCNAV.  R ear pressure.    Allergies: 1)  ! Codeine Sulfate 2)  ! Toradol  Review of Systems       See HPI.  Otherwise negative.    Physical Exam  General:  no apparent distress normocephalic atraumatic L pinna, canal and tm wnl R pinna, canal and tm wnl except for small metallic FB seen on inferior portion of mid canal, not easily removed with curette, no Qtip in canal.   Impression & Recommendations:  Problem # 1:  FOREIGN BODY, EAR, RIGHT (ICD-931) I called ENT about referral.  I appreciate their input.  Plan for ciprodex today and then have him follow up with them tomorrow.  Orders: ENT Referral (ENT) No Charge Patient Arrived (NCPA0) (NCPA0)  Complete Medication List: 1)  Xanax 0.25 Mg Tabs (Alprazolam) .Marland Kitchen.. 1 every 6 hours as needed anxiety by mouth 2)  Fluoxetine Hcl 20 Mg Caps (Fluoxetine hcl) .Marland Kitchen.. 1 daily by mouth once daily 3)  Allegra 180 Mg Tabs (Fexofenadine hcl) .Marland Kitchen.. 1 tablet daily by mouth 4)  Lisinopril 5 Mg Tabs (Lisinopril) .... Take 1 tablet by mouth once a day 5)  Ibuprofen 800 Mg Tabs (Ibuprofen) .Marland Kitchen.. 1 by mouth three times a day as needed pain 6)  Multivitamins Tabs (Multiple vitamin) .... Once daily 7)  Ciprodex 0.3-0.1 % Susp (Ciprofloxacin-dexamethasone) .... 5 drops in the ear two times a day  Patient Instructions: 1)  Use the ciprodex 5 drops two times a day and go to [facility] tomorrow at 12:45PM.  2)  [address].  Prescriptions: CIPRODEX 0.3-0.1 % SUSP (CIPROFLOXACIN-DEXAMETHASONE) 5 drops in the ear two times a day  #27ml x 0   Entered and Authorized by:   Crawford Givens MD   Signed by:   Crawford Givens MD on 10/14/2010   Method used:   Electronically to        [facility]. [address] [phone]* (retail)       [address] [place], [place]  [id number]       Ph: [phone]       Fax: [phone]   RxID:   [id number]    Orders Added: 1)  ENT Referral [ENT] 2)  No Charge Patient Arrived (NCPA0) [NCPA0]    Current Allergies (reviewed today): ! CODEINE SULFATE ! TORADOL

## 2011-06-26 ENCOUNTER — Encounter: Payer: Self-pay | Admitting: Family Medicine

## 2011-06-26 ENCOUNTER — Ambulatory Visit (INDEPENDENT_AMBULATORY_CARE_PROVIDER_SITE_OTHER): Payer: BC Managed Care – PPO | Admitting: Family Medicine

## 2011-06-26 DIAGNOSIS — J329 Chronic sinusitis, unspecified: Secondary | ICD-10-CM

## 2011-06-26 DIAGNOSIS — H00019 Hordeolum externum unspecified eye, unspecified eyelid: Secondary | ICD-10-CM

## 2011-06-26 MED ORDER — FLUTICASONE PROPIONATE 50 MCG/ACT NA SUSP: 1.0000 | Freq: Every day | NASAL | Status: DC

## 2011-06-26 MED ORDER — AMOXICILLIN 875 MG PO TABS: 875.0000 mg | ORAL_TABLET | Freq: Two times a day (BID) | ORAL | Status: AC

## 2011-06-26 NOTE — Patient Instructions (Signed)
Start the antibiotics, use the nasal spray (1-2 sprays in each nostril per day) and use warm compresses on the stye.  Take care.  I would get a flu shot each fall.

## 2011-06-26 NOTE — Progress Notes (Signed)
R lower eyelid with lesion medially.  Present a few weeks.  No vision change.  The lid is irritated but his eye doesn't hurt.   duration of symptoms: 1 month, progressive in last week rhinorrhea:yes congestion:yes ear pain: mild sore throat: yes, at night cough:yes Myalgias: no No fever other concerns:chest feels heavy and cough is increased when supine.   ROS: See HPI.  Otherwise negative.    Meds, vitals, and allergies reviewed.   GEN: nad, alert and oriented, perrl and eomi, but lesion c/w stye noted on R lower medial lid .  HEENT: mucous membranes moist, TM w/o erythema, nasal epithelium injected, OP with cobblestoning, max sinus slightly ttp NECK: supple w/o LA CV: rrr. PULM: ctab, no inc wob, cough noted ABD: soft, +bs EXT: no edema

## 2011-06-28 DIAGNOSIS — H00019 Hordeolum externum unspecified eye, unspecified eyelid: Secondary | ICD-10-CM | POA: Insufficient documentation

## 2011-06-28 DIAGNOSIS — J329 Chronic sinusitis, unspecified: Secondary | ICD-10-CM | POA: Insufficient documentation

## 2011-06-28 NOTE — Assessment & Plan Note (Signed)
Given duration, start abx, flonase and then get flu shot.  D/w pt about using nasal steroids before allergy season starts in the future.  He agrees.

## 2011-06-28 NOTE — Assessment & Plan Note (Signed)
Warm compresses and f/u with eye clinic prn.

## 2011-07-31 ENCOUNTER — Other Ambulatory Visit: Payer: Self-pay | Admitting: Family Medicine

## 2011-08-03 NOTE — Telephone Encounter (Signed)
Received refill request electronically from pharmacy. Is it okay to refill medication? 

## 2011-10-27 ENCOUNTER — Encounter: Payer: Self-pay | Admitting: Family Medicine

## 2011-10-27 ENCOUNTER — Ambulatory Visit (INDEPENDENT_AMBULATORY_CARE_PROVIDER_SITE_OTHER): Payer: BC Managed Care – PPO | Admitting: Family Medicine

## 2011-10-27 VITALS — BP 140/90 | HR 66 | Temp 98.8°F | Wt 245.8 lb

## 2011-10-27 DIAGNOSIS — J329 Chronic sinusitis, unspecified: Secondary | ICD-10-CM

## 2011-10-27 DIAGNOSIS — T7840XA Allergy, unspecified, initial encounter: Secondary | ICD-10-CM

## 2011-10-27 MED ORDER — AMOXICILLIN-POT CLAVULANATE 875-125 MG PO TABS: 1.0000 | ORAL_TABLET | Freq: Two times a day (BID) | ORAL | Status: AC

## 2011-10-27 MED ORDER — CROMOLYN SODIUM 5.2 MG/ACT NA AERS: 1.0000 | INHALATION_SPRAY | Freq: Four times a day (QID) | NASAL | Status: DC

## 2011-10-27 NOTE — Progress Notes (Signed)
  Subjective:    Patient ID: Frank Strickland, male    DOB: 1966-05-29, 46 y.o.   MRN: [id number]  HPI CC: sinus congestion  Has been working with walnut recently (woodworker).  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  2 week h/o sinus congestion, chest and back ache from coughing.  Last 2 nights unable to sleep 2/2 sinus congestion.  Feels like has gone into chest.  + PNDrainage, ST, HA for 2 days (sinus pressure).  Feels like severe cold.  Chills last night.  + ear pain.  + RN.  Unable to blow mucous but feels congestion..  Using flonase, allegra.  Also tried mucinex for vough.  No fevers, abd pain, n/v, rashes, tooth pain  No sick contacts at home.  No smokers at home.  ++ h/o allergies.  No h/o asthma.  Review of Systems Per HPI    Objective:   Physical Exam  Nursing note and vitals reviewed. Constitutional: He appears well-developed and well-nourished. No distress.  HENT:  Head: Normocephalic and atraumatic.  Right Ear: Hearing, tympanic membrane, external ear and ear canal normal.  Left Ear: Hearing, tympanic membrane, external ear and ear canal normal.  Nose: No mucosal edema or rhinorrhea. Right sinus exhibits frontal sinus tenderness. Right sinus exhibits no maxillary sinus tenderness. Left sinus exhibits frontal sinus tenderness. Left sinus exhibits no maxillary sinus tenderness.  Mouth/Throat: Uvula is midline, oropharynx is clear and moist and mucous membranes are normal. No oropharyngeal exudate, posterior oropharyngeal edema, posterior oropharyngeal erythema or tonsillar abscesses.  Eyes: Conjunctivae and EOM are normal. Pupils are equal, round, and reactive to light. No scleral icterus.  Neck: Normal range of motion. Neck supple.  Cardiovascular: Normal rate, regular rhythm, normal heart sounds and intact distal pulses.   No murmur heard. Pulmonary/Chest: Effort normal and breath sounds normal. No respiratory distress. He has no  wheezes. He has no rales.  Lymphadenopathy:    He has no cervical adenopathy.  Skin: Skin is warm and dry. No rash noted.       Assessment & Plan:

## 2011-10-27 NOTE — Patient Instructions (Signed)
flonase is once a day medicine. You can do nasal saline irrigation throughout the day. Look into nasalcrom over the counter for when you know you're going to be exposed to woods (as needed). You have a sinus infection. Take medicine as prescribed: augmentin. Push fluids and plenty of rest. Nasal saline irrigation or neti pot to help drain sinuses. May use simple mucinex with plenty of fluid to help mobilize mucous. Let us know if fever >101.5, trouble opening/closing mouth, difficulty swallowing, or worsening - you may need to be seen again.

## 2011-10-27 NOTE — Assessment & Plan Note (Signed)
Continue allegra, flonase. Consider trial of nasalcrom for prn use when around woods that are more irritating (congestion)

## 2011-10-27 NOTE — Assessment & Plan Note (Signed)
given duration, treat with abx - augmentin. Continue flonase, nasal saline and mucinex. Update [place] if not improving as expected. See pt instructions.

## 2011-10-28 ENCOUNTER — Ambulatory Visit: Payer: BC Managed Care – PPO | Admitting: Family Medicine

## 2011-11-04 ENCOUNTER — Other Ambulatory Visit: Payer: Self-pay | Admitting: Family Medicine

## 2011-12-11 ENCOUNTER — Encounter: Payer: Self-pay | Admitting: Family Medicine

## 2011-12-11 ENCOUNTER — Ambulatory Visit (INDEPENDENT_AMBULATORY_CARE_PROVIDER_SITE_OTHER)
Admission: RE | Admit: 2011-12-11 | Discharge: 2011-12-11 | Disposition: A | Discharge status: Home / Self Care | Payer: BC Managed Care – PPO | Source: Ambulatory Visit | Attending: Family Medicine | Admitting: Family Medicine

## 2011-12-11 ENCOUNTER — Ambulatory Visit (INDEPENDENT_AMBULATORY_CARE_PROVIDER_SITE_OTHER): Payer: BC Managed Care – PPO | Admitting: Family Medicine

## 2011-12-11 VITALS — BP 130/82 | HR 56 | Temp 98.7°F | Ht 70.0 in | Wt 240.0 lb

## 2011-12-11 DIAGNOSIS — M25561 Pain in right knee: Secondary | ICD-10-CM

## 2011-12-11 DIAGNOSIS — M25569 Pain in unspecified knee: Secondary | ICD-10-CM

## 2011-12-11 MED ORDER — IBUPROFEN 800 MG PO TABS: 800.0000 mg | ORAL_TABLET | Freq: Three times a day (TID) | ORAL | Status: AC | PRN

## 2011-12-11 NOTE — Patient Instructions (Signed)
Recheck with Dr. Patsy Lager in 3-4 weeks

## 2011-12-11 NOTE — Progress Notes (Signed)
  Patient Name: Frank Strickland Date of Birth: 01-Apr-1966 Age: 46 y.o. Medical Record Number: [id number] Gender: male Date of Encounter: 12/11/2011  History of Present Illness:  Frank Strickland is a 46 y.o. very pleasant male patient who presents with the following:  R knee, was walking up a ladder, and felt like a pull on the middle of his knee. This wsa last Thursday. Has been wearing a knee brace. Sore in the back. Tight in the back.  No history of surgery. Hurt out years ago hunting.   Steel work for a long time.   Patient presents with 8 day h/o R sided knee pain after a pop/pull while going up a ladder. The patient has had an effusion, starting 1 day after injury. No symptomatic giving-way. No mechanical clicking. Joint has not locked up. Patient has been able to walk but is limping. The patient does have pain going up and down stairs or rising from a seated position.   Pain location: middle of knee, medial, posterior Current physical activity: active at work Prior Knee Surgery: none Current pain meds: nsaids, tylenol Bracing: yes Occupation or school level: steel work   Past Medical History, Surgical History, Social History, Family History, Problem List, Medications, and Allergies have been reviewed and updated if relevant.  Review of Systems:  GEN: No fevers, chills. Nontoxic. Primarily MSK c/o today. MSK: Detailed in the HPI GI: tolerating PO intake without difficulty Neuro: No numbness, parasthesias, or tingling associated. Otherwise the pertinent positives of the ROS are noted above.    Physical Examination: Filed Vitals:   12/11/11 0834  BP: 130/82  Pulse: 56  Temp: 98.7 F (37.1 C)  TempSrc: Oral  Height: 5\' 10"  (1.778 m)  Weight: 240 lb (108.863 kg)  SpO2: 96%    Body mass index is 34.44 kg/(m^2).   GEN: WDWN, NAD, Non-toxic, Alert & Oriented x 3 HEENT: Atraumatic, Normocephalic.  Ears and Nose: No external deformity. EXTR: No  clubbing/cyanosis/edema NEURO: Normal gait.  PSYCH: Normally interactive. Conversant. Not depressed or anxious appearing.  Calm demeanor.   Knee:  R Gait: Normal heel toe pattern ROM: 0-110 Effusion: moderate Echymosis or edema: none Patellar tendon NT Painful PLICA: neg Patellar grind: negative Medial and lateral patellar facet loading: negative medial and lateral joint lines: medial joint line pain Mcmurray's pain Flexion-pinch pos Bounce home neg Varus and valgus stress: stable Lachman: neg Ant and Post drawer: neg Hip abduction, IR, ER: WNL Hip flexion str: 5/5 Hip abd: 5/5 Quad: 5/5 VMO atrophy:No Hamstring concentric and eccentric: 5/5   Assessment and Plan:  1. Right knee pain  DG Knee Complete 4 Views Right    Internal derangement, most suspicious for medial meniscal pathology, no mechanical symptoms. Trial of conservative management NSAIDS, brace, recheck 3-4 weeks.  Knee Aspiration and Injection, R Patient verbally consented; risks, benefits, and alternatives explained including possible infection. Patient prepped with Chloraprep. Ethyl chloride for anesthesia. 10 cc of 1% Lidocaine used in wheal then injected Subcutaneous fashion with 27 gauge needle on lateral approach. Under sterilne conditions, 18 gauge needle used via lateral approach to aspirate 25 cc of serosanguinous fluid. Then 9 cc of Lidocaine 1% and 1 cc of Depo-Medrol 40 mg injected. Tolerated well, decreased pain, no complications.

## 2012-01-13 ENCOUNTER — Other Ambulatory Visit: Payer: Self-pay | Admitting: Family Medicine

## 2012-01-14 ENCOUNTER — Ambulatory Visit (INDEPENDENT_AMBULATORY_CARE_PROVIDER_SITE_OTHER): Payer: BC Managed Care – PPO | Admitting: Family Medicine

## 2012-01-14 ENCOUNTER — Encounter: Payer: Self-pay | Admitting: Family Medicine

## 2012-01-14 VITALS — BP 130/84 | HR 72 | Temp 98.9°F | Resp 20 | Ht 70.0 in | Wt 240.5 lb

## 2012-01-14 DIAGNOSIS — M25561 Pain in right knee: Secondary | ICD-10-CM

## 2012-01-14 DIAGNOSIS — M25569 Pain in unspecified knee: Secondary | ICD-10-CM

## 2012-01-14 NOTE — Telephone Encounter (Signed)
Electronic refill request.  Patient has not had CPE in quite some time.

## 2012-01-14 NOTE — Progress Notes (Signed)
Patient Name: Frank Strickland Date of Birth: November 27, 1965 Medical Record Number: [id number] Gender: male Date of Encounter: 01/14/2012  History of Present Illness:  Frank Strickland is a 46 y.o. very pleasant male patient who presents with the following:  Pleasant gentleman who presents after a knee injury greater than one month ago on the right side. The patient was climbing a ladder, then felt some significant pain in his right knee. He has done some oral anti-inflammatories as well as some Tylenol. I saw him approximately one month ago and did an intra-articular knee injection. Patient's knee feels somewhat better, but he still having some significant pain with full flexion and rotation movements at the knee. He is not having any locking up or giving way.  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   12/11/2011 OV knee, was walking up a ladder, and felt like a pull on the middle of his knee. This wsa last Thursday. Has been wearing a knee brace. Sore in the back. Tight in the back.   No history of surgery. Hurt out years ago hunting.   Steel work for a long time.   Patient presents with 8 day h/o R sided knee pain after a pop/pull while going up a ladder. The patient has had an effusion, starting 1 day after injury. No symptomatic giving-way. No mechanical clicking. Joint has not locked up. Patient has been able to walk but is limping. The patient does have pain going up and down stairs or rising from a seated position.   Pain location: middle of knee, medial, posterior Current physical activity: active at work Prior Knee Surgery: none Current pain meds: nsaids, tylenol Bracing: yes Occupation or school level: steel work   Patient Active Problem List  Diagnoses  . HYPERLIPIDEMIA  . ANXIETY DEPRESSION  . ESSENTIAL HYPERTENSION, BENIGN  . HYPERGLYCEMIA    . ALLERGY  . Stye  . Sinusitis   Past Medical History  Diagnosis Date  . Allergy, unspecified not elsewhere classified    Past Surgical History  Procedure Date  . Kidney stone surgery 2002    X 2 cystoscopy with stent   History  Substance Use Topics  . Smoking status: Former Games developer  . Smokeless tobacco: Not on file  . Alcohol Use: Yes     Occasional   Family History  Problem Relation Age of Onset  . Allergies Mother   . Cancer Father     lung-smoker, prostate  . Heart disease Sister   . Hypertension Sister    Allergies  Allergen Reactions  . Codeine Sulfate     REACTION: headache  . Ketorolac Tromethamine     REACTION: severe headache    Medication list has been reviewed and updated.  Prior to Admission medications   Medication Sig Start Date End Date Taking? Authorizing Provider  cromolyn (NASALCROM) 5.2 MG/ACT nasal spray Place 1 spray into the nose 4 (four) times daily. 10/27/11 10/26/12 Yes Eustaquio Boyden, MD  fexofenadine (ALLEGRA) 180 MG tablet Take 180 mg by mouth daily.     Yes Historical Provider, MD  fish oil-omega-3 fatty acids 1000 MG capsule Take 1 g by mouth 2 (two) times daily.   Yes Historical Provider, MD  FLUoxetine (PROZAC) 20 MG capsule take 1 capsule by mouth once daily 11/04/11  Yes Joaquim Nam, MD  fluticasone (  FLONASE) 50 MCG/ACT nasal spray Place 1-2 sprays into the nose daily. 06/26/11 06/25/12 Yes Joaquim Nam, MD  lisinopril (PRINIVIL,ZESTRIL) 5 MG tablet take 1 tablet by mouth once daily 11/04/11  Yes Joaquim Nam, MD  Multiple Vitamin (MULTIVITAMIN) tablet Take 1 tablet by mouth daily.     Yes Historical Provider, MD  ALPRAZolam (XANAX) 0.25 MG tablet Take 0.25 mg by mouth every 6 (six) hours as needed.      Historical Provider, MD    Review of Systems:  GEN: No fevers, chills. Nontoxic. Primarily MSK c/o today. MSK: Detailed in the HPI GI: tolerating PO intake without difficulty Neuro: No numbness, parasthesias, or tingling  associated. Otherwise the pertinent positives of the ROS are noted above.    Physical Examination: Filed Vitals:   01/14/12 0903  BP: 130/84  Pulse: 72  Temp: 98.9 F (37.2 C)  Resp: 20   Filed Vitals:   01/14/12 0903  Height: 5\' 10"  (1.778 m)  Weight: 240 lb 8 oz (109.09 kg)   Body mass index is 34.51 kg/(m^2).   GEN: WDWN, NAD, Non-toxic, Alert & Oriented x 3 HEENT: Atraumatic, Normocephalic.  Ears and Nose: No external deformity. EXTR: No clubbing/cyanosis/edema NEURO: Normal gait.  PSYCH: Normally interactive. Conversant. Not depressed or anxious appearing.  Calm demeanor.   Knee:  r Gait: Normal heel toe pattern ROM: 0-125 Effusion: neg Echymosis or edema: none Patellar tendon NT Painful PLICA: neg Patellar grind: negative Medial and lateral patellar facet loading: negative medial and lateral joint lines: medial joint line pain Mcmurray's pos for pain Flexion-pinch pos Varus and valgus stress: stable Lachman: neg Ant and Post drawer: neg Hip abduction, IR, ER: WNL Hip flexion str: 5/5 Hip abd: 5/5 Quad: 5/5 VMO atrophy:No Hamstring concentric and eccentric: 5/5   Assessment and Plan:  1. Right knee pain  MR Knee Right Wo Contrast   Clinical concern for medial meniscal pathology. Obtain an MRI of the right knee without contrast in a patient who has failed conservative management greater than one month. Positive meniscal signs as above and exam. Pain in risky positions and occupational setting favors more aggressive care.  If surgery is indicated, the patient would prefer [facility] surgeon.   Orders Today: Orders Placed This Encounter  Procedures  . MR Knee Right Wo Contrast    Standing Status: Future     Number of Occurrences:      Standing Expiration Date: 03/15/2013    Order Specific Question:  Reason for exam:    Answer:  [facility], concern for meniscal tear    Order Specific Question:  Preferred imaging location?    Answer:  External    Order Specific  Question:  Does the patient have a pacemaker, internal devices, implants, aneury    Answer:  No    Medications Today: No orders of the defined types were placed in this encounter.     Hannah Beat, MD 01/14/2012 10:07 AM

## 2012-01-14 NOTE — Patient Instructions (Signed)
REFERRAL: GO THE THE FRONT ROOM AT THE ENTRANCE OF OUR CLINIC, NEAR CHECK IN. ASK FOR MARION. SHE WILL HELP YOU SET UP YOUR REFERRAL. DATE: TIME:  

## 2012-01-15 NOTE — Telephone Encounter (Signed)
LMOVM of cell phone. 

## 2012-01-15 NOTE — Telephone Encounter (Signed)
Schedule a CPE with labs ahead of time.  rx sent.  Thanks.

## 2012-01-19 ENCOUNTER — Ambulatory Visit: Payer: Self-pay | Admitting: Family Medicine

## 2012-01-20 ENCOUNTER — Encounter: Payer: Self-pay | Admitting: Family Medicine

## 2012-03-28 ENCOUNTER — Ambulatory Visit (INDEPENDENT_AMBULATORY_CARE_PROVIDER_SITE_OTHER): Payer: BC Managed Care – PPO | Admitting: Family Medicine

## 2012-03-28 ENCOUNTER — Encounter: Payer: Self-pay | Admitting: Family Medicine

## 2012-03-28 VITALS — BP 130/84 | HR 64 | Temp 98.9°F | Ht 70.0 in | Wt 238.8 lb

## 2012-03-28 DIAGNOSIS — M1710 Unilateral primary osteoarthritis, unspecified knee: Secondary | ICD-10-CM

## 2012-03-28 NOTE — Progress Notes (Signed)
Nature conservation officer at [facility] [address] [place] [address] Phone: [phone] Fax: [phone]  Date:  03/28/2012   Name:  Frank Strickland   DOB:  03/04/1966   MRN:  [id number] Gender: male  Age: 46 y.o.  PCP:  Crawford Givens, MD    Chief Complaint: No chief complaint on file.   History of Present Illness:  Frank Strickland is a 46 y.o. very pleasant male patient who presents with the following:  Taking ibuprofen all day long, will have to elevate it. R knee continues to hurt quite a bit  Reviewed MRI from 01/2012, significant degenerative changes, no meniscal or ligamentous pathology.   Past Medical History, Surgical History, Social History, Family History, Problem List, Medications, and Allergies have been reviewed and updated if relevant.  Current Outpatient Prescriptions on File Prior to Visit  Medication Sig Dispense Refill  . ALPRAZolam (XANAX) 0.25 MG tablet Take 0.25 mg by mouth every 6 (six) hours as needed.        . cromolyn (NASALCROM) 5.2 MG/ACT nasal spray Place 1 spray into the nose 4 (four) times daily.  26 mL  12  . fexofenadine (ALLEGRA) 180 MG tablet Take 180 mg by mouth daily.        . fish oil-omega-3 fatty acids 1000 MG capsule Take 1 g by mouth 2 (two) times daily.      Marland Kitchen FLUoxetine (PROZAC) 20 MG capsule take 1 capsule by mouth once daily  30 capsule  2  . fluticasone (FLONASE) 50 MCG/ACT nasal spray Place 1-2 sprays into the nose daily.  16 g  5  . lisinopril (PRINIVIL,ZESTRIL) 5 MG tablet take 1 tablet by mouth once daily  30 tablet  2  . Multiple Vitamin (MULTIVITAMIN) tablet Take 1 tablet by mouth daily.          Review of Systems:  GEN: No fevers, chills. Nontoxic. Primarily MSK c/o today. MSK: Detailed in the HPI GI: tolerating PO intake without difficulty Neuro: No numbness, parasthesias, or tingling associated. Otherwise the pertinent positives of the ROS are noted above.    Physical Examination: Filed Vitals:   03/28/12 0816    BP: 130/84  Pulse: 64  Temp: 98.9 F (37.2 C)  TempSrc: Oral  Height: 5\' 10"  (1.778 m)  Weight: 238 lb 12 oz (108.296 kg)    GEN: WDWN, NAD, Non-toxic, Alert & Oriented x 3 HEENT: Atraumatic, Normocephalic.  Ears and Nose: No external deformity. EXTR: No clubbing/cyanosis/edema NEURO: Normal gait.  PSYCH: Normally interactive. Conversant. Not depressed or anxious appearing.  Calm demeanor.   Knee:  R Gait: Normal heel toe pattern ROM: 0-125 Effusion: neg Echymosis or edema: none Patellar tendon NT Painful PLICA: neg Patellar grind: negative Medial and lateral patellar facet loading: negative medial and lateral joint lines:NT Mcmurray's pos  Flexion-pinch pos Varus and valgus stress: stable Lachman: neg Ant and Post drawer: neg Hip abduction, IR, ER: WNL Hip flexion str: 5/5 Hip abd: 5/5 Quad: 5/5 VMO atrophy:No Hamstring concentric and eccentric: 5/5   Assessment and Plan:  1. Arthritis of knee    Cont motrin Reviewed MR. If symptoms persist, diagnostic arthroscopy may be indicated  Knee Aspiration and Injection, R Patient verbally consented; risks, benefits, and alternatives explained including possible infection. Patient prepped with Chloraprep. Ethyl chloride for anesthesia. 10 cc of 1% Lidocaine used in wheal then injected Subcutaneous fashion with 27 gauge needle on lateral approach. Under sterilne conditions, 18 gauge needle used via lateral approach  to aspirate 10 cc of serosanguinous fluid. Then 3 cc of Lidocaine 1% and 1 cc of Depo-Medrol 80 mg injected. Tolerated well, decreased pain, no complications.   Orders Today:  No orders of the defined types were placed in this encounter.    Medications Today: (Includes new updates added during medication reconciliation) Meds ordered this encounter  Medications  . fluticasone (FLONASE) 50 MCG/ACT nasal spray    Sig: Place 1-2 sprays into the nose daily as needed.  . cromolyn (NASALCROM) 5.2 MG/ACT nasal  spray    Sig: Place 1 spray into the nose 4 (four) times daily as needed.  Marland Kitchen ibuprofen (ADVIL,MOTRIN) 800 MG tablet    Sig: Take 800 mg by mouth every 8 (eight) hours as needed.  Marland Kitchen ibuprofen (ADVIL,MOTRIN) 200 MG tablet    Sig: Take 200 mg by mouth every 6 (six) hours as needed.    Medications Discontinued: Medications Discontinued During This Encounter  Medication Reason  . cromolyn (NASALCROM) 5.2 MG/ACT nasal spray   . fluticasone (FLONASE) 50 MCG/ACT nasal spray     Labs Results from 03/28/2012 that have returned and recent labs: Results for orders placed in visit on 09/12/10  RENAL FUNCTION PANEL      Component Value Range   Sodium 138  135 - 145 mEq/L   Potassium 4.5  3.5 - 5.1 mEq/L   Chloride 104  96 - 112 mEq/L   CO2 29  19 - 32 mEq/L   Calcium 8.6  8.4 - 10.5 mg/dL   Albumin 3.9  3.5 - 5.2 g/dL   BUN 23  6 - 23 mg/dL   Creatinine, Ser 0.8  0.4 - 1.5 mg/dL   Glucose, Bld 85  70 - 99 mg/dL   Phosphorus 3.5  2.3 - 4.6 mg/dL   GFR 454.09  >81.19 mL/min  LIPID PANEL      Component Value Range   Cholesterol 234 (*) 0 - 200 mg/dL   Triglycerides 147.8  0.0 - 149.0 mg/dL   HDL 29.56  >21.30 mg/dL   VLDL 86.5  0.0 - 78.4 mg/dL   Total CHOL/HDL Ratio 5    HEPATIC FUNCTION PANEL      Component Value Range   Total Bilirubin 1.5 (*) 0.3 - 1.2 mg/dL   Bilirubin, Direct 0.1  0.0 - 0.3 mg/dL   Alkaline Phosphatase 63  39 - 117 U/L   AST 23  0 - 37 U/L   ALT 14  0 - 53 U/L   Total Protein 6.2  6.0 - 8.3 g/dL   Albumin 3.9  3.5 - 5.2 g/dL  TSH      Component Value Range   TSH 1.30  0.35 - 5.50 uIU/mL  CBC WITH DIFFERENTIAL      Component Value Range   WBC 5.0  4.5 - 10.5 K/uL   RBC 4.32  4.22 - 5.81 Mil/uL   Hemoglobin 14.5  13.0 - 17.0 g/dL   HCT 69.6  29.5 - 28.4 %   MCV 96.7  78.0 - 100.0 fl   MCHC 34.8  30.0 - 36.0 g/dL   RDW 13.2  44.0 - 10.2 %   Platelets 159.0  150.0 - 400.0 K/uL   Neutrophils Relative 50.4  43.0 - 77.0 %   Lymphocytes Relative 39.3  12.0 -  46.0 %   Monocytes Relative 7.1  3.0 - 12.0 %   Eosinophils Relative 2.7  0.0 - 5.0 %   Basophils Relative 0.5  0.0 - 3.0 %  Neutro Abs 2.5  1.4 - 7.7 K/uL   Lymphs Abs 2.0  0.7 - 4.0 K/uL   Monocytes Absolute 0.4  0.1 - 1.0 K/uL   Eosinophils Absolute 0.1  0.0 - 0.7 K/uL   Basophils Absolute 0.0  0.0 - 0.1 K/uL  MICROALBUMIN / CREATININE URINE RATIO      Component Value Range   Microalb, Ur 0.5  0.0 - 1.9 mg/dL   Creatinine,U 295.2     Microalb Creat Ratio 0.5  0.0 - 30.0 mg/g  LDL CHOLESTEROL, DIRECT      Component Value Range   Direct LDL 165.2       Hannah Beat, MD

## 2012-05-10 ENCOUNTER — Other Ambulatory Visit: Payer: Self-pay | Admitting: Family Medicine

## 2012-05-10 NOTE — Telephone Encounter (Signed)
LMOVM of cell phone (personal identification) to call to schedule CPE and labs.

## 2012-05-10 NOTE — Telephone Encounter (Signed)
Electronic refill request.  Last seen by you on 06/26/11.  Seen by Dr. Reece Agar once and Dr. Patsy Lager a few times for arthritis.  Please advise.

## 2012-05-10 NOTE — Telephone Encounter (Signed)
Sent for 1 month with 1 rf.  Needs CPE with labs ahead of time.

## 2012-05-11 ENCOUNTER — Telehealth: Payer: Self-pay | Admitting: Family Medicine

## 2012-05-11 NOTE — Telephone Encounter (Signed)
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 Can you do this without him coming in to see you?

## 2012-05-12 NOTE — Telephone Encounter (Signed)
Frank Strickland called back this morning to say that the reason that he needs this test is to be fitted for a respirator for his job.  He is faxing [name] a form that she will give to Dr. Para March and he will look it over and advise if an appt is needed or if he can order the pulmonary function test for work related reasons.

## 2012-05-12 NOTE — Telephone Encounter (Signed)
I'll look at the form when it arrives.

## 2012-07-07 ENCOUNTER — Other Ambulatory Visit: Payer: Self-pay | Admitting: Family Medicine

## 2012-07-07 NOTE — Telephone Encounter (Signed)
Electronic refill request.  Patient was seen in August but was given limited RF's at last RF request.  Please advise.

## 2012-07-07 NOTE — Telephone Encounter (Signed)
Deny this.  He's coming in tomorrow. He needs to talk to me about these meds.  He's due for labs.

## 2012-07-08 ENCOUNTER — Ambulatory Visit (INDEPENDENT_AMBULATORY_CARE_PROVIDER_SITE_OTHER): Payer: BC Managed Care – PPO | Admitting: Family Medicine

## 2012-07-08 ENCOUNTER — Encounter: Payer: Self-pay | Admitting: Family Medicine

## 2012-07-08 VITALS — BP 144/84 | HR 61 | Temp 98.3°F | Wt 242.0 lb

## 2012-07-08 DIAGNOSIS — S86819A Strain of other muscle(s) and tendon(s) at lower leg level, unspecified leg, initial encounter: Secondary | ICD-10-CM

## 2012-07-08 DIAGNOSIS — I10 Essential (primary) hypertension: Secondary | ICD-10-CM

## 2012-07-08 DIAGNOSIS — S86119A Strain of other muscle(s) and tendon(s) of posterior muscle group at lower leg level, unspecified leg, initial encounter: Secondary | ICD-10-CM

## 2012-07-08 DIAGNOSIS — S838X9A Sprain of other specified parts of unspecified knee, initial encounter: Secondary | ICD-10-CM

## 2012-07-08 LAB — COMPREHENSIVE METABOLIC PANEL
ALT: 25 U/L (ref 0–53)
AST: 36 U/L (ref 0–37)
Albumin: 4.1 g/dL (ref 3.5–5.2)
Alkaline Phosphatase: 63 U/L (ref 39–117)
BUN: 18 mg/dL (ref 6–23)
CO2: 25 mEq/L (ref 19–32)
Calcium: 8.5 mg/dL (ref 8.4–10.5)
Chloride: 102 mEq/L (ref 96–112)
Creatinine, Ser: 0.8 mg/dL (ref 0.4–1.5)
GFR: 118.18 mL/min (ref >60.00)
Glucose, Bld: 95 mg/dL (ref 70–99)
Potassium: 3.8 mEq/L (ref 3.5–5.1)
Sodium: 134 mEq/L — ABNORMAL LOW (ref 135–145)
Total Bilirubin: 1.5 mg/dL — ABNORMAL HIGH (ref 0.3–1.2)
Total Protein: 7 g/dL (ref 6.0–8.3)

## 2012-07-08 LAB — LDL CHOLESTEROL, DIRECT: Direct LDL: 153 mg/dL

## 2012-07-08 LAB — LIPID PANEL
Cholesterol: 215 mg/dL — ABNORMAL HIGH (ref 0–200)
HDL: 46.3 mg/dL (ref >39.00)
Total CHOL/HDL Ratio: 5
Triglycerides: 194 mg/dL — ABNORMAL HIGH (ref 0.0–149.0)
VLDL: 38.8 mg/dL (ref 0.0–40.0)

## 2012-07-08 MED ORDER — LISINOPRIL 5 MG PO TABS: ORAL_TABLET | ORAL | Status: DC

## 2012-07-08 NOTE — Patient Instructions (Addendum)
We'll contact you with your lab report. Get heel lifts and use one in each shoe. Use the ABCDEF... exercises for range of motion on your foot.  When pain is improved, then start with 5 calf raises a day.  Increase gradually up to 20.   If pain increases, notify the clinic.

## 2012-07-08 NOTE — Progress Notes (Signed)
07/06/12 PM he stepped in a hole and felt something in his R calf.  Immediate swelling in the area.  No bruising noted by patient. He felt a spasm in the calf.  He still has ROM but pain with dorsiflexion, plantar flexion.  Ibuprofen helps some with the pain.  He can still walk with a slight limp.   Hypertension:    Using medication without problems or lightheadedness: yes Chest pain with exertion:no Edema:none other than as above Short of breath:no Fasting today.   Due for labs.   Meds, vitals, and allergies reviewed.   ROS: See HPI.  Otherwise, noncontributory.  nad ncat Mmm rrr ctab abd soft, not ttp R calf slightly puffy R 44cm L 41cm Pain with foot dorsiflexion/plantar flexion, but not on testing same for great toe on R foot Distally nv intact Not ttp at the achilles, achilles is intact

## 2012-07-09 DIAGNOSIS — S86119A Strain of other muscle(s) and tendon(s) of posterior muscle group at lower leg level, unspecified leg, initial encounter: Secondary | ICD-10-CM | POA: Insufficient documentation

## 2012-07-09 NOTE — Assessment & Plan Note (Signed)
At work.  Some better today.  Not a full tear based on exam.  No need to image now.  Would use heel lifts and rom exercises.  Relative rest in meantime. When improved, then gradual inc in reps with calf raises.  He understood.

## 2012-07-09 NOTE — Assessment & Plan Note (Signed)
See notes on labs, continue current meds.  

## 2012-07-11 ENCOUNTER — Encounter: Payer: Self-pay | Admitting: *Deleted

## 2012-07-12 ENCOUNTER — Telehealth: Payer: Self-pay

## 2012-07-12 MED ORDER — FLUOXETINE HCL 20 MG PO CAPS: 20.0000 mg | ORAL_CAPSULE | Freq: Every day | ORAL | Status: DC

## 2012-07-12 NOTE — Telephone Encounter (Signed)
Sent, thanks.  I thought I sent this at the OV when I sent the lisinopril.

## 2012-07-12 NOTE — Telephone Encounter (Signed)
Pt request refill Prozac to [facility]. . Pt phone note prozac rx denied until seen on 07/08/12. Is OK to fill.Please advise. Pt request recent lab results. Pt notified as instructed by result note.

## 2012-07-22 ENCOUNTER — Encounter: Payer: BC Managed Care – PPO | Admitting: Family Medicine

## 2012-08-09 ENCOUNTER — Other Ambulatory Visit: Payer: BC Managed Care – PPO

## 2012-08-15 ENCOUNTER — Encounter: Payer: Self-pay | Admitting: Family Medicine

## 2012-08-15 ENCOUNTER — Ambulatory Visit (INDEPENDENT_AMBULATORY_CARE_PROVIDER_SITE_OTHER): Payer: BC Managed Care – PPO | Admitting: Family Medicine

## 2012-08-15 VITALS — BP 132/84 | HR 65 | Temp 98.6°F | Ht 70.0 in | Wt 242.0 lb

## 2012-08-15 DIAGNOSIS — Z0000 Encounter for general adult medical examination without abnormal findings: Secondary | ICD-10-CM

## 2012-08-15 DIAGNOSIS — F341 Dysthymic disorder: Secondary | ICD-10-CM

## 2012-08-15 DIAGNOSIS — Z029 Encounter for administrative examinations, unspecified: Secondary | ICD-10-CM

## 2012-08-15 DIAGNOSIS — I10 Essential (primary) hypertension: Secondary | ICD-10-CM

## 2012-08-15 LAB — POCT URINALYSIS DIPSTICK
Bilirubin, UA: NEGATIVE
Blood, UA: NEGATIVE
Glucose, UA: NEGATIVE
Ketones, UA: NEGATIVE
Leukocytes, UA: NEGATIVE
Nitrite, UA: NEGATIVE
Protein, UA: NEGATIVE
Spec Grav, UA: 1.02
Urobilinogen, UA: NEGATIVE
pH, UA: 6.5

## 2012-08-15 NOTE — Patient Instructions (Addendum)
Take care.  Glad to see you.  Don't change your meds.

## 2012-08-17 ENCOUNTER — Encounter: Payer: Self-pay | Admitting: Family Medicine

## 2012-08-17 DIAGNOSIS — Z0000 Encounter for general adult medical examination without abnormal findings: Secondary | ICD-10-CM | POA: Insufficient documentation

## 2012-08-17 NOTE — Progress Notes (Signed)
CPE- See plan.  Routine anticipatory guidance given to patient.  See health maintenance.  DOT forms done.   See scanned forms for DOT details.    Hypertension:    Using medication without problems or lightheadedness: y Chest pain with exertion:n Edema:n Short of breath:n No contraindication to driving from h/o HTN  H/o anxiety controlled with meds.  Sx started during period of high stress.  No SI/HI.  Doing well.  Rare use of xanax; he doesn't take before or during driving.  No contraindication to driving from h/o anxiety.    PMH and SH reviewed  Meds, vitals, and allergies reviewed.   ROS: See HPI.  Otherwise negative.    GEN: nad, alert and oriented HEENT: mucous membranes moist NECK: supple w/o LA CV: rrr. PULM: ctab, no inc wob ABD: soft, +bs EXT: no edema SKIN: no acute rash See scanned forms for details.

## 2012-08-17 NOTE — Assessment & Plan Note (Signed)
Controlled, doing well.  No contraindication to driving from h/o anxiety.  DOT forms done.

## 2012-08-17 NOTE — Assessment & Plan Note (Signed)
Controlled, continue current meds.  D/w pt about diet and weight.  No contraindication to driving from h/o HTN.

## 2012-08-17 NOTE — Assessment & Plan Note (Addendum)
Labs d/w pt.  Tetanus up to date.  Flu shot encouraged.  D/w pt about diet and exercise.  DOT forms done for 1 year.

## 2012-12-30 ENCOUNTER — Other Ambulatory Visit: Payer: Self-pay | Admitting: Family Medicine

## 2013-02-14 ENCOUNTER — Ambulatory Visit (INDEPENDENT_AMBULATORY_CARE_PROVIDER_SITE_OTHER): Payer: BC Managed Care – PPO | Admitting: Family Medicine

## 2013-02-14 ENCOUNTER — Encounter: Payer: Self-pay | Admitting: Family Medicine

## 2013-02-14 VITALS — BP 144/84 | HR 64 | Temp 98.0°F | Wt 247.8 lb

## 2013-02-14 DIAGNOSIS — J019 Acute sinusitis, unspecified: Secondary | ICD-10-CM

## 2013-02-14 MED ORDER — HYDROCODONE-HOMATROPINE 5-1.5 MG/5ML PO SYRP: 5.0000 mL | ORAL_SOLUTION | Freq: Three times a day (TID) | ORAL | Status: DC | PRN

## 2013-02-14 MED ORDER — AMOXICILLIN-POT CLAVULANATE 875-125 MG PO TABS: 1.0000 | ORAL_TABLET | Freq: Two times a day (BID) | ORAL | Status: DC

## 2013-02-14 NOTE — Patient Instructions (Addendum)
You should be able to tolerate the cough medicine (it doesn't have codeine).  It you can't, then let me know.  Start the antibiotics in the meantime.  Take care.

## 2013-02-14 NOTE — Progress Notes (Signed)
Was doing demo work, tearing out Astronomer.  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  Since then with congestion, runny nose, ears are painful, upper molar pain B, cough, voice is altered, sleep disrupted from postnasal gtt.  No fevers but has some sweats at night.  He "just doesn't feel well."  Fatigued.  Out of work today.  Facial pain recently.  Cough with some discolored sputum.    Meds, vitals, and allergies reviewed.   ROS: See HPI.  Otherwise, noncontributory.  GEN: nad, alert and oriented HEENT: mucous membranes moist, tm w/o erythema, nasal exam w/o erythema, clear discharge noted,  OP with cobblestoning, sinuses ttp x4 L ear canal with metallic FB noted,not at the TM, removed with curette (he had been welding a few weeks ago and a cinder flew in his L canal and "it really hurt" but hearing wasn't affected, has been using plugs in the meantime when welding) NECK: supple w/o LA CV: rrr.   PULM: ctab, no inc wob EXT: no edema SKIN: no acute rash

## 2013-02-15 DIAGNOSIS — J019 Acute sinusitis, unspecified: Secondary | ICD-10-CM | POA: Insufficient documentation

## 2013-02-15 NOTE — Assessment & Plan Note (Signed)
Nontoxic, rest, fluids, augmentin and hycodan with sedation caution.  F/u prn.  He agrees.  The L ear canal is normal on inspection after removal of very small FB.

## 2013-04-26 ENCOUNTER — Ambulatory Visit (INDEPENDENT_AMBULATORY_CARE_PROVIDER_SITE_OTHER): Payer: BC Managed Care – PPO | Admitting: Family Medicine

## 2013-04-26 ENCOUNTER — Encounter: Payer: Self-pay | Admitting: Family Medicine

## 2013-04-26 VITALS — BP 124/86 | HR 72 | Temp 98.5°F | Ht 70.0 in | Wt 240.2 lb

## 2013-04-26 DIAGNOSIS — M1711 Unilateral primary osteoarthritis, right knee: Secondary | ICD-10-CM

## 2013-04-26 DIAGNOSIS — IMO0002 Reserved for concepts with insufficient information to code with codable children: Secondary | ICD-10-CM

## 2013-04-26 DIAGNOSIS — M1710 Unilateral primary osteoarthritis, unspecified knee: Secondary | ICD-10-CM

## 2013-04-26 NOTE — Progress Notes (Signed)
Nature conservation officer at [facility] [address] [place] [address] Phone: [phone] Fax: [phone]  Date:  04/26/2013   Name:  Frank Strickland   DOB:  1965/09/24   MRN:  [id number] Gender: male Age: 47 y.o.  Primary Physician:  Crawford Givens, MD  Evaluating MD: Hannah Beat, MD   Chief Complaint: Knee Pain   History of Present Illness:  DSHAWN Strickland is a 47 y.o. pleasant patient who presents with the following:  R knee, will be intermittent swelling. Tries to stay active and with work. Works in [employer]. Very physical - heavy labor. He has known OA. He has had excellent responses from intraarticular steroids in the past. He has a mild effusion right now. No recent trauma or accident.    Patient Active Problem List   Diagnosis Date Noted  . Sinusitis, acute 02/15/2013  . Routine general medical examination at a health care facility 08/17/2012  . Gastrocnemius strain 07/09/2012  . HYPERLIPIDEMIA 10/12/2007  . ESSENTIAL HYPERTENSION, BENIGN 10/10/2007  . ANXIETY DEPRESSION 02/22/2007  . ALLERGY 02/22/2007    Past Medical History  Diagnosis Date  . Allergy, unspecified not elsewhere classified   . Hyperlipidemia   . Anxiety     Past Surgical History  Procedure Laterality Date  . Kidney stone surgery  2002    X 2 cystoscopy with stent    History   Social History  . Marital Status: Married    Spouse Name: N/A    Number of Children: 3  . Years of Education: N/A   Occupational History  .     Social History Main Topics  . Smoking status: Former Games developer  . Smokeless tobacco: Never Used     Comment: quit 1997  . Alcohol Use: Yes     Comment: Occasional  . Drug Use: No  . Sexual Activity: Not on file   Other Topics Concern  . Not on file   Social History Narrative  . No narrative on file    Family History  Problem Relation Age of Onset  . Allergies Mother   . Cancer Father     lung-smoker  . Heart disease Sister   .  Hypertension Sister     Allergies  Allergen Reactions  . Codeine Sulfate     REACTION: headache  . Ketorolac Tromethamine     REACTION: severe headache    Medication list has been reviewed and updated.  Outpatient Prescriptions Prior to Visit  Medication Sig Dispense Refill  . ALPRAZolam (XANAX) 0.25 MG tablet Take 0.25 mg by mouth every 6 (six) hours as needed.        . fexofenadine (ALLEGRA) 180 MG tablet Take 180 mg by mouth daily.        Marland Kitchen FLUoxetine (PROZAC) 20 MG capsule Take 1 capsule (20 mg total) by mouth daily.  30 capsule  12  . fluticasone (FLONASE) 50 MCG/ACT nasal spray Place 1-2 sprays into the nose daily as needed.      Marland Kitchen lisinopril (PRINIVIL,ZESTRIL) 5 MG tablet take 1 tablet by mouth once daily  30 tablet  5  . Multiple Vitamin (MULTIVITAMIN) tablet Take 1 tablet by mouth daily.        . cromolyn (NASALCROM) 5.2 MG/ACT nasal spray Place 1 spray into the nose 4 (four) times daily as needed.      . fish oil-omega-3 fatty acids 1000 MG capsule Take 1 g by mouth 2 (two) times daily.      Marland Kitchen  amoxicillin-clavulanate (AUGMENTIN) 875-125 MG per tablet Take 1 tablet by mouth 2 (two) times daily.  20 tablet  0  . HYDROcodone-homatropine (HYCODAN) 5-1.5 MG/5ML syrup Take 5 mLs by mouth every 8 (eight) hours as needed for cough.  120 mL  0   No facility-administered medications prior to visit.    Review of Systems:   GEN: No fevers, chills. Nontoxic. Primarily MSK c/o today. MSK: Detailed in the HPI GI: tolerating PO intake without difficulty Neuro: No numbness, parasthesias, or tingling associated. Otherwise the pertinent positives of the ROS are noted above.    Physical Examination: BP 124/86  Pulse 72  Temp(Src) 98.5 F (36.9 C) (Oral)  Ht 5\' 10"  (1.778 m)  Wt 240 lb 4 oz (108.977 kg)  BMI 34.47 kg/m2  Ideal Body Weight: Weight in (lb) to have BMI = 25: 173.9   GEN: WDWN, NAD, Non-toxic, Alert & Oriented x 3 HEENT: Atraumatic, Normocephalic.  Ears and Nose:  No external deformity. EXTR: No clubbing/cyanosis/edema NEURO: Normal gait.  PSYCH: Normally interactive. Conversant. Not depressed or anxious appearing.  Calm demeanor.   Knee:  r Gait: Normal heel toe pattern ROM: 0-110 Effusion: minimal-mild Echymosis or edema: none Patellar tendon NT Painful PLICA: neg Patellar grind: pos Medial and lateral patellar facet loading: negative medial and lateral joint lines: medial joint line tenderness Mcmurray's neg Flexion-pinch pos Varus and valgus stress: stable Lachman: neg Ant and Post drawer: neg Hip abduction, IR, ER: WNL Hip flexion str: 5/5 Hip abd: 5/5 Quad: 5/5 VMO atrophy:No Hamstring concentric and eccentric: 5/5   Assessment and Plan: Osteoarthritis of right knee   Prn ice, nsaids, tylenol. Does not seem at point for TKA.  Knee Injection, RIGHT Patient verbally consented to procedure. Risks (including potential rare risk of infection), benefits, and alternatives explained. Sterilely prepped with Chloraprep. Ethyl cholride used for anesthesia. 8 cc Lidocaine 1% mixed with 2 cc of Depo-Medrol 40 mg injected using the anterolateral approach without difficulty. No complications with procedure and tolerated well. Patient had decreased pain post-injection.   Signed, Elpidio Galea. Nehemiah Mcfarren, MD 04/26/2013 10:20 AM

## 2013-04-27 ENCOUNTER — Ambulatory Visit: Payer: Self-pay | Admitting: Unknown Physician Specialty

## 2013-05-01 ENCOUNTER — Other Ambulatory Visit: Payer: Self-pay | Admitting: Unknown Physician Specialty

## 2013-05-01 LAB — CLOSTRIDIUM DIFFICILE BY PCR

## 2013-05-04 LAB — STOOL CULTURE

## 2013-05-05 ENCOUNTER — Ambulatory Visit: Payer: Self-pay | Admitting: Unknown Physician Specialty

## 2013-05-05 LAB — CBC WITH DIFFERENTIAL/PLATELET
Basophil #: 0 10*3/uL (ref 0.0–0.1)
Basophil %: 0.6 %
Eosinophil #: 0.1 10*3/uL (ref 0.0–0.7)
Eosinophil %: 1.2 %
HCT: 47.6 % (ref 40.0–52.0)
HGB: 16.8 g/dL (ref 13.0–18.0)
Lymphocyte #: 2.8 10*3/uL (ref 1.0–3.6)
Lymphocyte %: 34 %
MCH: 33.2 pg (ref 26.0–34.0)
MCHC: 35.2 g/dL (ref 32.0–36.0)
MCV: 94 fL (ref 80–100)
Monocyte #: 0.5 x10 3/mm (ref 0.2–1.0)
Monocyte %: 6.4 %
Neutrophil #: 4.7 10*3/uL (ref 1.4–6.5)
Neutrophil %: 57.8 %
Platelet: 209 10*3/uL (ref 150–440)
RBC: 5.05 10*6/uL (ref 4.40–5.90)
RDW: 12 % (ref 11.5–14.5)
WBC: 8.2 10*3/uL (ref 3.8–10.6)

## 2013-05-05 LAB — BASIC METABOLIC PANEL
Anion Gap: 6 — ABNORMAL LOW (ref 7–16)
BUN: 13 mg/dL (ref 7–18)
Calcium, Total: 8.8 mg/dL (ref 8.5–10.1)
Chloride: 99 mmol/L (ref 98–107)
Co2: 26 mmol/L (ref 21–32)
Creatinine: 1.14 mg/dL (ref 0.60–1.30)
EGFR (African American): >60
EGFR (Non-African Amer.): >60
Glucose: 130 mg/dL — ABNORMAL HIGH (ref 65–99)
Osmolality: 265 (ref 275–301)
Potassium: 3.9 mmol/L (ref 3.5–5.1)
Sodium: 131 mmol/L — ABNORMAL LOW (ref 136–145)

## 2013-05-09 LAB — PATHOLOGY REPORT

## 2013-05-14 ENCOUNTER — Encounter: Payer: Self-pay | Admitting: Family Medicine

## 2013-05-14 DIAGNOSIS — R197 Diarrhea, unspecified: Secondary | ICD-10-CM | POA: Insufficient documentation

## 2013-05-22 ENCOUNTER — Encounter: Payer: Self-pay | Admitting: Family Medicine

## 2013-07-11 ENCOUNTER — Other Ambulatory Visit: Payer: Self-pay | Admitting: Family Medicine

## 2013-08-14 ENCOUNTER — Other Ambulatory Visit: Payer: Self-pay | Admitting: Family Medicine

## 2013-11-08 ENCOUNTER — Other Ambulatory Visit: Payer: Self-pay | Admitting: Family Medicine

## 2013-11-08 NOTE — Telephone Encounter (Signed)
Deny this for now.  If he'll schedule an OV, then fill it to that point.

## 2013-11-08 NOTE — Telephone Encounter (Signed)
Received refill request electronically from pharmacy. Last office visit 04/26/13/acute visit and noted on refill 08/14/13; patient needs office visit for further refills. No appointments scheduled. Is it okay to refill medication?

## 2013-11-17 ENCOUNTER — Other Ambulatory Visit: Payer: Self-pay | Admitting: Family Medicine

## 2013-11-20 NOTE — Telephone Encounter (Signed)
E-prescribe error.  plz phone in.  #30 with 1 refill

## 2013-11-20 NOTE — Telephone Encounter (Signed)
Medication phoned to pharmacy.  

## 2013-11-20 NOTE — Telephone Encounter (Signed)
Electronic refill request. Patient has not been seen in greater than 1 year other than acute appts.  No upcoming appt scheduled.  According to refill protocol, please advise. Notification to schedule OV was sent at last RF.

## 2013-12-11 ENCOUNTER — Ambulatory Visit: Payer: BC Managed Care – PPO | Admitting: Family Medicine

## 2014-01-10 ENCOUNTER — Other Ambulatory Visit: Payer: Self-pay | Admitting: Family Medicine

## 2014-01-15 ENCOUNTER — Ambulatory Visit (INDEPENDENT_AMBULATORY_CARE_PROVIDER_SITE_OTHER): Payer: No Typology Code available for payment source | Admitting: Family Medicine

## 2014-01-15 ENCOUNTER — Encounter: Payer: Self-pay | Admitting: Family Medicine

## 2014-01-15 VITALS — BP 112/82 | HR 70 | Temp 97.8°F | Ht 70.0 in | Wt 240.5 lb

## 2014-01-15 DIAGNOSIS — M1711 Unilateral primary osteoarthritis, right knee: Secondary | ICD-10-CM

## 2014-01-15 DIAGNOSIS — M1710 Unilateral primary osteoarthritis, unspecified knee: Secondary | ICD-10-CM

## 2014-01-15 DIAGNOSIS — IMO0002 Reserved for concepts with insufficient information to code with codable children: Secondary | ICD-10-CM

## 2014-01-15 NOTE — Progress Notes (Signed)
[address] [place] [address] Phone: [phone] Fax: [phone]  Patient ID: Frank Strickland MRN: [id number], DOB: 12/22/1965, 48 y.o. Date of Encounter: 01/15/2014  Primary Physician:  Crawford Givens, MD   Chief Complaint: Knee Pain   Subjective:   History of Present Illness:  Frank Strickland is a 48 y.o. very pleasant male patient who presents with the following:  Known right OA knee, last seen in September 2013, where the patient had an intra-articular knee injection that provided relief for about 5 months. His knee started to bother him quite a bit more about one month ago. No recent accident or injury. R knee, been bothering him for about 4 weeks. Was keeping it propped pain, burning and pain.   Lasted about 5 months or so, then about the last 4 weeks it got bad.   Past Medical History, Surgical History, Social History, Family History, Problem List, Medications, and Allergies have been reviewed and updated if relevant.  Review of Systems:  GEN: No acute illnesses, no fevers, chills. GI: No n/v/d, eating normally Pulm: No SOB Interactive and getting along well at home.  Otherwise, ROS is as per the HPI.  Objective:   Physical Examination: BP 112/82  Pulse 70  Temp(Src) 97.8 F (36.6 C) (Oral)  Ht 5\' 10"  (1.778 m)  Wt 240 lb 8 oz (109.09 kg)  BMI 34.51 kg/m2   GEN: WDWN, NAD, Non-toxic, Alert & Oriented x 3 HEENT: Atraumatic, Normocephalic.  Ears and Nose: No external deformity. EXTR: No clubbing/cyanosis/edema NEURO: Normal gait.  PSYCH: Normally interactive. Conversant. Not depressed or anxious appearing.  Calm demeanor.   Right knee: Full range of motion. 0-120. Nontender with patellar manipulation. Mild medial joint line tenderness. Stable to varus and valgus stress. Anterior cruciate ligament and PCL are stable.  Laboratory and Imaging Data:  Assessment & Plan:   Osteoarthritis of right knee  Confirmed only on prior MRI without additional  internal derangement. Intra-articular injection today. Continue with when necessary anti-inflammatories.  Knee Injection, R Patient verbally consented to procedure. Risks (including potential rare risk of infection), benefits, and alternatives explained. Sterilely prepped with Chloraprep. Ethyl cholride used for anesthesia. 8 cc Lidocaine 1% mixed with Depo-Medrol 80 mg injected using the anteromedial approach without difficulty. No complications with procedure and tolerated well. Patient had decreased pain post-injection.   Signed,  . Lira Stephen, MD, CA[facility] Sports Medicine   Patient's Medications  New Prescriptions   No medications on file  Previous Medications   ALPRAZOLAM (XANAX) 0.25 MG TABLET    Take 0.25 mg by mouth every 6 (six) hours as needed.     CROMOLYN (NASALCROM) 5.2 MG/ACT NASAL SPRAY    Place 1 spray into the nose 4 (four) times daily as needed.   FEXOFENADINE (ALLEGRA) 180 MG TABLET    Take 180 mg by mouth daily.     FLUOXETINE (PROZAC) 20 MG CAPSULE    take 1 capsule by mouth once daily   FLUTICASONE (FLONASE) 50 MCG/ACT NASAL SPRAY    Place 1-2 sprays into the nose daily as needed.   LISINOPRIL (PRINIVIL,ZESTRIL) 5 MG TABLET    take 1 tablet by mouth once daily   MULTIPLE VITAMIN (MULTIVITAMIN) TABLET    Take 1 tablet by mouth daily.    Modified Medications   No medications on file  Discontinued Medications   FISH OIL-OMEGA-3 FATTY ACIDS 1000 MG CAPSULE    Take 1 g by mouth 2 (two) times daily.

## 2014-01-15 NOTE — Progress Notes (Signed)
Pre visit review using our clinic review tool, if applicable. No additional management support is needed unless otherwise documented below in the visit note. 

## 2014-03-09 ENCOUNTER — Other Ambulatory Visit: Payer: Self-pay | Admitting: Family Medicine

## 2014-03-12 NOTE — Telephone Encounter (Signed)
Electronic refill request. Does patient need a CPE?  Last Filled:   30 tablet 0 RF on   01/10/2014  Please advise.

## 2014-03-13 ENCOUNTER — Encounter: Payer: Self-pay | Admitting: *Deleted

## 2014-03-13 NOTE — Telephone Encounter (Signed)
Yes due for CPE. Thanks. Sent.

## 2014-03-13 NOTE — Telephone Encounter (Signed)
Only one phone number provided.  VM does not allow a message to be left, only asks that we call back later.  Letter mailed.

## 2014-06-28 ENCOUNTER — Encounter: Payer: Self-pay | Admitting: Family Medicine

## 2014-06-28 ENCOUNTER — Ambulatory Visit (INDEPENDENT_AMBULATORY_CARE_PROVIDER_SITE_OTHER): Payer: No Typology Code available for payment source | Admitting: Family Medicine

## 2014-06-28 VITALS — BP 116/80 | HR 67 | Temp 98.5°F | Ht 70.0 in | Wt 233.5 lb

## 2014-06-28 DIAGNOSIS — M25561 Pain in right knee: Secondary | ICD-10-CM

## 2014-06-28 DIAGNOSIS — M1711 Unilateral primary osteoarthritis, right knee: Secondary | ICD-10-CM

## 2014-06-28 MED ORDER — METHYLPREDNISOLONE ACETATE 40 MG/ML IJ SUSP
80.0000 mg | Freq: Once | INTRAMUSCULAR | Status: AC
  Administered 2014-06-28: 80 mg via INTRA_ARTICULAR

## 2014-06-28 NOTE — Progress Notes (Signed)
Pre visit review using our clinic review tool, if applicable. No additional management support is needed unless otherwise documented below in the visit note. 

## 2014-06-28 NOTE — Progress Notes (Signed)
Dr. Karleen Hampshire T. Elasha Tess, MD, CA[facility] Sports Medicine [facility] [address] [place], [address] Phone: [phone] Fax: [phone]  06/28/2014  Patient: Frank Strickland, MRN: [id number], DOB: [birth year], 48 y.o.  Primary Physician:  Crawford Givens, MD  Chief Complaint: Knee Pain  Subjective:   Frank Strickland is a 48 y.o. very pleasant male patient who presents with the following:  R OA: Alleve not on a daily.   Patient has no significant osteoarthritis with right knee, confirmed on MRI in 2013, a stitch having recurrence of symptoms. I saw him in June of 2015, and injected his knee at that point, where he had relief of symptoms for approximately 3 months. He is taking some Aleve intermittently, and he ices his knee last night.  Past Medical History, Surgical History, Social History, Family History, Problem List, Medications, and Allergies have been reviewed and updated if relevant.  GEN: No fevers, chills. Nontoxic. Primarily MSK c/o today. MSK: Detailed in the HPI GI: tolerating PO intake without difficulty Neuro: No numbness, parasthesias, or tingling associated. Otherwise the pertinent positives of the ROS are noted above.   Objective:   BP 116/80 mmHg  Pulse 67  Temp(Src) 98.5 F (36.9 C) (Oral)  Ht 5\' 10"  (1.778 m)  Wt 233 lb 8 oz (105.915 kg)  BMI 33.50 kg/m2   GEN: WDWN, NAD, Non-toxic, Alert & Oriented x 3 HEENT: Atraumatic, Normocephalic.  Ears and Nose: No external deformity. EXTR: No clubbing/cyanosis/edema NEURO: Normal gait.  PSYCH: Normally interactive. Conversant. Not depressed or anxious appearing.  Calm demeanor.    Right knee: Full extension. Flexion to 105. Nontender at the patella. Medial and lateral joint line tenderness. Stable MCL and LCL. Stable anterior cruciate ligament and PCL. Bounce home test is positive.  Radiology: No results found.  Assessment and Plan:   Primary osteoarthritis of right knee  Right knee  pain - Plan: methylPREDNISolone acetate (DEPO-MEDROL) injection 80 mg  Alleve 2 tabs by mouth two times a day over the counter: Take at least for 2 - 3 weeks. This is equal to a prescripton strength dose (GENERIC CHEAPER EQUIVALENT IS NAPROXEN SODIUM)   Take Tylenol/Acetaminophen ES (500mg ) 2 tabs by mouth three times a day max as needed.   If he has waning relief of symptoms with corticosteroid, we can always do hyaluronic acid.  Knee Injection, RIGHT Patient verbally consented to procedure. Risks (including potential rare risk of infection), benefits, and alternatives explained. Sterilely prepped with Chloraprep. Ethyl cholride used for anesthesia. 8 cc Lidocaine 1% mixed with Depo-Medrol 80 mg injected using the anteromedial approach without difficulty. No complications with procedure and tolerated well. Patient had decreased pain post-injection.   Signed,  . Alexi Geibel, MD   Patient's Medications  New Prescriptions   No medications on file  Previous Medications   FEXOFENADINE (ALLEGRA) 180 MG TABLET    Take 180 mg by mouth daily.     FLUOXETINE (PROZAC) 20 MG CAPSULE    take 1 capsule by mouth once daily   FLUTICASONE (FLONASE) 50 MCG/ACT NASAL SPRAY    Place 1-2 sprays into the nose daily as needed.   LISINOPRIL (PRINIVIL,ZESTRIL) 5 MG TABLET    take 1 tablet by mouth once daily   MULTIPLE VITAMIN (MULTIVITAMIN) TABLET    Take 1 tablet by mouth daily.     OMEPRAZOLE (PRILOSEC) 40 MG CAPSULE    Take 40 mg by mouth daily.  Modified Medications   No medications on  file  Discontinued Medications   ALPRAZOLAM (XANAX) 0.25 MG TABLET    Take 0.25 mg by mouth every 6 (six) hours as needed.     CROMOLYN (NASALCROM) 5.2 MG/ACT NASAL SPRAY    Place 1 spray into the nose 4 (four) times daily as needed.

## 2014-07-05 ENCOUNTER — Telehealth: Payer: Self-pay | Admitting: Family Medicine

## 2014-07-05 NOTE — Telephone Encounter (Signed)
Pt is calling in saying he is taking 2 extra strength tylenol and ibuprofen ever 5 hours and it is not touching the pain. He wanted to know if you would call something in for pain like the 800mg  ibuprofen. Says he thinks he may need to come back and be seen. Please advise. Thank you!

## 2014-07-06 MED ORDER — DICLOFENAC SODIUM 75 MG PO TBEC: 75.0000 mg | DELAYED_RELEASE_TABLET | Freq: Two times a day (BID) | ORAL | Status: DC

## 2014-07-06 NOTE — Telephone Encounter (Signed)
Stop OTC ibuprofen  Start  Voltaren 75 mg, 1 po bid, #60, 2 refills  -- do this scheduled for the next 2-3 weeks  If still needs help with pain: Tramadol 50 mg, 1 po q 6 hours prn pain, #50, 2 refills

## 2014-07-06 NOTE — Telephone Encounter (Signed)
Left message for Frank Strickland that prescription has been sent to his pharmacy.  Dr. Patsy Lager would like for him to follow up in 2 to 3 weeks so please call back to schedule that appointment.  Advised to try the diclofenac over the weekend to see how that works and to call me back on Monday if he still needs help with the pain.

## 2014-07-09 ENCOUNTER — Other Ambulatory Visit: Payer: Self-pay | Admitting: Family Medicine

## 2014-07-09 NOTE — Telephone Encounter (Signed)
Received refill request electronically from pharmacy. Last office visit 07/05/14/acute. Is it okay to refill medication?

## 2014-07-10 NOTE — Telephone Encounter (Signed)
Current contact numbers have been disconnected.

## 2014-07-10 NOTE — Telephone Encounter (Signed)
Sent, schedule a CPE.  Thanks.  

## 2014-09-14 ENCOUNTER — Telehealth: Payer: Self-pay | Admitting: Family Medicine

## 2014-09-14 NOTE — Telephone Encounter (Signed)
Patient's wife called to request records for patient's visit on Monday with Dr.Armour at [facility].  Patient's wife said she wanted to thank you for all you did for patient. I printed patient's last office visit note and MRI and patient will pick them up on Monday.

## 2014-09-16 NOTE — Telephone Encounter (Signed)
Reasonable. I have not seen him in some time.

## 2014-10-26 ENCOUNTER — Other Ambulatory Visit: Payer: Self-pay | Admitting: Family Medicine

## 2015-05-29 ENCOUNTER — Other Ambulatory Visit: Payer: Self-pay | Admitting: Physician Assistant

## 2015-06-13 ENCOUNTER — Encounter: Payer: Self-pay | Admitting: Physician Assistant

## 2015-06-13 ENCOUNTER — Ambulatory Visit: Payer: Self-pay | Admitting: Physician Assistant

## 2015-06-13 VITALS — BP 121/70 | HR 57 | Temp 98.1°F

## 2015-06-13 DIAGNOSIS — I10 Essential (primary) hypertension: Secondary | ICD-10-CM

## 2015-06-13 DIAGNOSIS — K219 Gastro-esophageal reflux disease without esophagitis: Secondary | ICD-10-CM

## 2015-06-13 DIAGNOSIS — J3089 Other allergic rhinitis: Secondary | ICD-10-CM

## 2015-06-13 MED ORDER — OMEPRAZOLE 40 MG PO CPDR: 40.0000 mg | DELAYED_RELEASE_CAPSULE | Freq: Every day | ORAL | Status: DC

## 2015-06-13 MED ORDER — FLUTICASONE PROPIONATE 50 MCG/ACT NA SUSP: 1.0000 | Freq: Every day | NASAL | Status: DC | PRN

## 2015-06-13 MED ORDER — LISINOPRIL 5 MG PO TABS: 5.0000 mg | ORAL_TABLET | Freq: Every day | ORAL | Status: DC

## 2015-06-13 NOTE — Progress Notes (Signed)
S: here for med refill, no problems, no cp/sob, no abd pain, doing well on medications  O: vitals wnl, nad, lungs c t a, cv rrr  A: htn, gerd, allergic rhinitis  P: med refills for lisinopril, omeprazole, and flonase; pt to return Wednesday for fasting labs

## 2015-06-19 ENCOUNTER — Other Ambulatory Visit: Payer: Self-pay

## 2015-06-19 DIAGNOSIS — Z299 Encounter for prophylactic measures, unspecified: Secondary | ICD-10-CM

## 2015-06-19 NOTE — Progress Notes (Signed)
Patient is here for his yearly blood draw only.  Pt wants to be called once his results are finalized.

## 2015-06-20 LAB — CMP12+LP+TP+TSH+6AC+PSA+CBC…
ALT: 16 IU/L (ref 0–44)
AST: 24 IU/L (ref 0–40)
Albumin/Globulin Ratio: 2.5 (ref 1.1–2.5)
Albumin: 4.5 g/dL (ref 3.5–5.5)
Alkaline Phosphatase: 68 IU/L (ref 39–117)
BUN/Creatinine Ratio: 20 (ref 9–20)
BUN: 18 mg/dL (ref 6–24)
Basophils Absolute: 0 10*3/uL (ref 0.0–0.2)
Basos: 0 %
Bilirubin Total: 1 mg/dL (ref 0.0–1.2)
Calcium: 9 mg/dL (ref 8.7–10.2)
Chloride: 101 mmol/L (ref 97–106)
Chol/HDL Ratio: 4.3 ratio units (ref 0.0–5.0)
Cholesterol, Total: 233 mg/dL — ABNORMAL HIGH (ref 100–199)
Creatinine, Ser: 0.92 mg/dL (ref 0.76–1.27)
EOS (ABSOLUTE): 0.2 10*3/uL (ref 0.0–0.4)
Eos: 3 %
Estimated CHD Risk: 0.8 times avg. (ref 0.0–1.0)
Free Thyroxine Index: 1.5 (ref 1.2–4.9)
GFR calc Af Amer: 112 mL/min/{1.73_m2} (ref >59)
GFR calc non Af Amer: 97 mL/min/{1.73_m2} (ref >59)
GGT: 25 IU/L (ref 0–65)
Globulin, Total: 1.8 g/dL (ref 1.5–4.5)
Glucose: 93 mg/dL (ref 65–99)
HDL: 54 mg/dL (ref >39)
Hematocrit: 45.3 % (ref 37.5–51.0)
Hemoglobin: 15.2 g/dL (ref 12.6–17.7)
Immature Grans (Abs): 0 10*3/uL (ref 0.0–0.1)
Immature Granulocytes: 0 %
Iron: 151 ug/dL (ref 38–169)
LDH: 196 IU/L (ref 121–224)
LDL Calculated: 147 mg/dL — ABNORMAL HIGH (ref 0–99)
Lymphocytes Absolute: 2.7 10*3/uL (ref 0.7–3.1)
Lymphs: 39 %
MCH: 32.8 pg (ref 26.6–33.0)
MCHC: 33.6 g/dL (ref 31.5–35.7)
MCV: 98 fL — ABNORMAL HIGH (ref 79–97)
Monocytes Absolute: 0.5 10*3/uL (ref 0.1–0.9)
Monocytes: 8 %
Neutrophils Absolute: 3.4 10*3/uL (ref 1.4–7.0)
Neutrophils: 50 %
Phosphorus: 3.3 mg/dL (ref 2.5–4.5)
Platelets: 195 10*3/uL (ref 150–379)
Potassium: 4.9 mmol/L (ref 3.5–5.2)
Prostate Specific Ag, Serum: 0.4 ng/mL (ref 0.0–4.0)
RBC: 4.64 x10E6/uL (ref 4.14–5.80)
RDW: 12.7 % (ref 12.3–15.4)
Sodium: 141 mmol/L (ref 136–144)
T3 Uptake Ratio: 28 % (ref 24–39)
T4, Total: 5.5 ug/dL (ref 4.5–12.0)
TSH: 1.47 u[IU]/mL (ref 0.450–4.500)
Total Protein: 6.3 g/dL (ref 6.0–8.5)
Triglycerides: 161 mg/dL — ABNORMAL HIGH (ref 0–149)
Uric Acid: 5 mg/dL (ref 3.7–8.6)
VLDL Cholesterol Cal: 32 mg/dL (ref 5–40)
WBC: 6.8 10*3/uL (ref 3.4–10.8)

## 2015-06-20 LAB — VITAMIN D 25 HYDROXY (VIT D DEFICIENCY, FRACTURES): Vit D, 25-Hydroxy: 24.8 ng/mL — ABNORMAL LOW (ref 30.0–100.0)

## 2015-06-27 ENCOUNTER — Encounter: Payer: Self-pay | Admitting: Emergency Medicine

## 2015-06-27 NOTE — Progress Notes (Signed)
Lab results were mailed to patient along with cholesterol control information.

## 2015-07-02 ENCOUNTER — Other Ambulatory Visit: Payer: Self-pay | Admitting: Physician Assistant

## 2015-07-09 ENCOUNTER — Ambulatory Visit: Payer: Self-pay | Admitting: Physician Assistant

## 2015-07-10 ENCOUNTER — Encounter: Payer: Self-pay | Admitting: Physician Assistant

## 2015-07-10 ENCOUNTER — Ambulatory Visit: Payer: Self-pay | Admitting: Physician Assistant

## 2015-07-10 VITALS — BP 140/100 | HR 66 | Temp 98.0°F

## 2015-07-10 DIAGNOSIS — J069 Acute upper respiratory infection, unspecified: Secondary | ICD-10-CM

## 2015-07-10 MED ORDER — CEFDINIR 300 MG PO CAPS: 300.0000 mg | ORAL_CAPSULE | Freq: Two times a day (BID) | ORAL | Status: DC

## 2015-07-10 MED ORDER — HYDROCOD POLST-CPM POLST ER 10-8 MG/5ML PO SUER: 5.0000 mL | Freq: Two times a day (BID) | ORAL | Status: DC | PRN

## 2015-07-10 NOTE — Progress Notes (Signed)
S: C/o runny nose and congestion for 14 days, no fever, chills, cp/sob, v/d; mucus was green , cough is sporadic and keeping him awake at night, wife has same sx   Using otc meds: robitussin  O: PE: perrl eomi, normocephalic, tms dull, nasal mucosa red and swollen, throat injected, neck supple no lymph, lungs c t a, cv rrr, neuro intact, cough is dry and hacking  A:  Acute uri   P: drink fluids, continue regular meds , use otc meds of choice, return if not improving in 5 days, return earlier if worsening; omnicef 300mg  bid x 10d, tussionex nr

## 2015-07-19 ENCOUNTER — Other Ambulatory Visit: Payer: Self-pay | Admitting: Emergency Medicine

## 2015-07-19 DIAGNOSIS — F411 Generalized anxiety disorder: Secondary | ICD-10-CM

## 2015-07-19 MED ORDER — FLUOXETINE HCL 20 MG PO CAPS: 20.0000 mg | ORAL_CAPSULE | Freq: Every day | ORAL | Status: DC

## 2015-07-19 NOTE — Telephone Encounter (Signed)
Patient called and requested a med refill for his Prozac.

## 2015-07-19 NOTE — Telephone Encounter (Signed)
Med refill approved, pt doing well on same dose

## 2015-09-06 ENCOUNTER — Ambulatory Visit: Payer: Self-pay | Admitting: Physician Assistant

## 2015-09-06 ENCOUNTER — Encounter: Payer: Self-pay | Admitting: Physician Assistant

## 2015-09-06 VITALS — BP 140/90 | HR 71 | Temp 99.5°F

## 2015-09-06 DIAGNOSIS — J0100 Acute maxillary sinusitis, unspecified: Secondary | ICD-10-CM

## 2015-09-06 MED ORDER — AMOXICILLIN 875 MG PO TABS: 875.0000 mg | ORAL_TABLET | Freq: Two times a day (BID) | ORAL | Status: DC

## 2015-09-06 MED ORDER — BENZONATATE 200 MG PO CAPS: 200.0000 mg | ORAL_CAPSULE | Freq: Three times a day (TID) | ORAL | Status: DC | PRN

## 2015-09-06 MED ORDER — FEXOFENADINE-PSEUDOEPHED ER 60-120 MG PO TB12: 1.0000 | ORAL_TABLET | Freq: Two times a day (BID) | ORAL | Status: DC

## 2015-09-06 NOTE — Progress Notes (Signed)
S-Patient c/o one week of sinus congestion, post nasal drainage, and cough. Also c/o bilateral ear pressure. State fever/chill. Denies N/V/D. States fatigue and body ache. O- No acute distress.  Bilaterall edematous nasal turbinates. Bilateral maxillary guarding. Post nasal drainage. Pharynx is erythematous. Neck supple without exudate or adenopathy.  Lungs CTA and Heart RRR. A-Maxillary sinusitis. P- Amoxil, Allergra-D, and Tessalon.  F/U PRN.

## 2015-09-08 ENCOUNTER — Emergency Department
Admission: EM | Admit: 2015-09-08 | Discharge: 2015-09-08 | Disposition: A | Discharge status: Home / Self Care | Payer: Managed Care, Other (non HMO) | Attending: Emergency Medicine | Admitting: Emergency Medicine

## 2015-09-08 ENCOUNTER — Emergency Department: Payer: Managed Care, Other (non HMO)

## 2015-09-08 DIAGNOSIS — Z791 Long term (current) use of non-steroidal anti-inflammatories (NSAID): Secondary | ICD-10-CM | POA: Diagnosis not present

## 2015-09-08 DIAGNOSIS — Z87891 Personal history of nicotine dependence: Secondary | ICD-10-CM | POA: Diagnosis not present

## 2015-09-08 DIAGNOSIS — Z792 Long term (current) use of antibiotics: Secondary | ICD-10-CM | POA: Diagnosis not present

## 2015-09-08 DIAGNOSIS — R0602 Shortness of breath: Secondary | ICD-10-CM | POA: Diagnosis present

## 2015-09-08 DIAGNOSIS — J159 Unspecified bacterial pneumonia: Secondary | ICD-10-CM | POA: Diagnosis not present

## 2015-09-08 DIAGNOSIS — J189 Pneumonia, unspecified organism: Secondary | ICD-10-CM

## 2015-09-08 DIAGNOSIS — Z79899 Other long term (current) drug therapy: Secondary | ICD-10-CM | POA: Diagnosis not present

## 2015-09-08 DIAGNOSIS — I10 Essential (primary) hypertension: Secondary | ICD-10-CM | POA: Diagnosis not present

## 2015-09-08 DIAGNOSIS — R61 Generalized hyperhidrosis: Secondary | ICD-10-CM | POA: Insufficient documentation

## 2015-09-08 LAB — COMPREHENSIVE METABOLIC PANEL
ALT: 19 U/L (ref 17–63)
AST: 25 U/L (ref 15–41)
Albumin: 4.3 g/dL (ref 3.5–5.0)
Alkaline Phosphatase: 70 U/L (ref 38–126)
Anion gap: 8 (ref 5–15)
BUN: 11 mg/dL (ref 6–20)
CO2: 24 mmol/L (ref 22–32)
Calcium: 8.7 mg/dL — ABNORMAL LOW (ref 8.9–10.3)
Chloride: 103 mmol/L (ref 101–111)
Creatinine, Ser: 0.72 mg/dL (ref 0.61–1.24)
GFR calc Af Amer: >60 mL/min (ref >60)
GFR calc non Af Amer: >60 mL/min (ref >60)
Glucose, Bld: 121 mg/dL — ABNORMAL HIGH (ref 65–99)
Potassium: 4.1 mmol/L (ref 3.5–5.1)
Sodium: 135 mmol/L (ref 135–145)
Total Bilirubin: 1.1 mg/dL (ref 0.3–1.2)
Total Protein: 7.4 g/dL (ref 6.5–8.1)

## 2015-09-08 LAB — CBC
HCT: 45 % (ref 40.0–52.0)
Hemoglobin: 15.5 g/dL (ref 13.0–18.0)
MCH: 32.2 pg (ref 26.0–34.0)
MCHC: 34.5 g/dL (ref 32.0–36.0)
MCV: 93.3 fL (ref 80.0–100.0)
Platelets: 152 10*3/uL (ref 150–440)
RBC: 4.82 MIL/uL (ref 4.40–5.90)
RDW: 11.8 % (ref 11.5–14.5)
WBC: 6.1 10*3/uL (ref 3.8–10.6)

## 2015-09-08 LAB — CK: Total CK: 201 U/L (ref 49–397)

## 2015-09-08 LAB — RAPID INFLUENZA A&B ANTIGENS
Influenza A (ARMC): NEGATIVE
Influenza B (ARMC): NEGATIVE

## 2015-09-08 LAB — TROPONIN I: Troponin I: <0.03 ng/mL (ref <0.031)

## 2015-09-08 MED ORDER — LEVOFLOXACIN IN D5W 750 MG/150ML IV SOLN
750.0000 mg | Freq: Once | INTRAVENOUS | Status: AC
  Administered 2015-09-08: 750 mg via INTRAVENOUS
  Filled 2015-09-08: qty 150

## 2015-09-08 MED ORDER — DEXTROSE 5 % IV SOLN
1.0000 g | Freq: Once | INTRAVENOUS | Status: DC
  Filled 2015-09-08: qty 10

## 2015-09-08 MED ORDER — LEVOFLOXACIN 750 MG PO TABS: 750.0000 mg | ORAL_TABLET | Freq: Every day | ORAL | Status: AC

## 2015-09-08 MED ORDER — DEXTROSE 5 % IV SOLN
500.0000 mg | Freq: Once | INTRAVENOUS | Status: DC
  Filled 2015-09-08: qty 500

## 2015-09-08 MED ORDER — ACETAMINOPHEN 325 MG PO TABS
650.0000 mg | ORAL_TABLET | Freq: Once | ORAL | Status: AC
  Administered 2015-09-08: 650 mg via ORAL
  Filled 2015-09-08: qty 2

## 2015-09-08 MED ORDER — SODIUM CHLORIDE 0.9 % IV SOLN
1000.0000 mL | Freq: Once | INTRAVENOUS | Status: AC
  Administered 2015-09-08: 1000 mL via INTRAVENOUS

## 2015-09-08 NOTE — ED Notes (Signed)
Pt verbalized understanding of discharge instructions. NAD at this time. 

## 2015-09-08 NOTE — ED Provider Notes (Signed)
[facility] Emergency Department Provider Note  ____________________________________________  Time seen: On arrival  I have reviewed the triage vital signs and the nursing notes.   HISTORY  Chief Complaint Chest Pain and URI    HPI Frank Strickland is a 50 y.o. male who presents with complaints of chills, shortness of breath, chest discomfort congestion and cough for 4 days. He was prescribed amoxicillin for presumed bronchitis by his PCP. He denies recent travel. No sick contacts. No history of the same. He has been compliant with his medications. Chest pain is primarily after coughing     Past Medical History  Diagnosis Date  . Allergy, unspecified not elsewhere classified   . Hyperlipidemia   . Anxiety   . Duodenal ulcer     2014    Patient Active Problem List   Diagnosis Date Noted  . Diarrhea 05/14/2013  . Sinusitis, acute 02/15/2013  . Routine general medical examination at a health care facility 08/17/2012  . Gastrocnemius strain 07/09/2012  . HYPERLIPIDEMIA 10/12/2007  . ESSENTIAL HYPERTENSION, BENIGN 10/10/2007  . ANXIETY DEPRESSION 02/22/2007  . ALLERGY 02/22/2007    Past Surgical History  Procedure Laterality Date  . Kidney stone surgery  2002    X 2 cystoscopy with stent    Current Outpatient Rx  Name  Route  Sig  Dispense  Refill  . amoxicillin (AMOXIL) 875 MG tablet   Oral   Take 1 tablet (875 mg total) by mouth 2 (two) times daily.   20 tablet   0   . benzonatate (TESSALON) 200 MG capsule   Oral   Take 1 capsule (200 mg total) by mouth 3 (three) times daily as needed for cough.   20 capsule   0   . fexofenadine-pseudoephedrine (ALLEGRA-D) 60-120 MG 12 hr tablet   Oral   Take 1 tablet by mouth 2 (two) times daily.   30 tablet   0   . FLUoxetine (PROZAC) 20 MG capsule   Oral   Take 1 capsule (20 mg total) by mouth daily. * Needs appointment for additional refills*   30 capsule   6   . fluticasone (FLONASE) 50  MCG/ACT nasal spray   Each Nare   Place 1-2 sprays into both nostrils daily as needed.   16 g   12   . lisinopril (PRINIVIL,ZESTRIL) 5 MG tablet   Oral   Take 1 tablet (5 mg total) by mouth daily.   30 tablet   6   . Nutritional Supplements (JUICE PLUS FIBRE PO)   Oral   Take 4 tablets by mouth daily.         Marland Kitchen omeprazole (PRILOSEC) 40 MG capsule   Oral   Take 1 capsule (40 mg total) by mouth daily. Patient taking differently: Take 40 mg by mouth daily as needed (for heartburn/indigestion.).    30 capsule   6   . chlorpheniramine-HYDROcodone (TUSSIONEX PENNKINETIC ER) 10-8 MG/5ML SUER   Oral   Take 5 mLs by mouth every 12 (twelve) hours as needed for cough.   150 mL   0     rx written   . diclofenac (VOLTAREN) 75 MG EC tablet   Oral   Take 1 tablet (75 mg total) by mouth 2 (two) times daily.   60 tablet   2     Allergies Codeine sulfate and Ketorolac tromethamine  Family History  Problem Relation Age of Onset  . Allergies Mother   . Cancer Father  lung-smoker  . Heart disease Sister   . Hypertension Sister     Social History Social History  Substance Use Topics  . Smoking status: Former Games developer  . Smokeless tobacco: Never Used     Comment: quit 1997  . Alcohol Use: Yes     Comment: Occasional    Review of Systems  Constitutional: Positive for fever and chills Eyes: Negative for visual changes. ENT: Negative for sore throat Cardiovascular: Positive for chest discomfort Respiratory: No for cough and shortness of breath Gastrointestinal: Negative for abdominal pain, vomiting and diarrhea. Genitourinary: Negative for dysuria. Musculoskeletal: Negative for back pain. Skin: Negative for rash. Neurological: Positive for headaches Psychiatric: No anxiety    ____________________________________________   PHYSICAL EXAM:  VITAL SIGNS: ED Triage Vitals  Enc Vitals Group     BP 09/08/15 0844 142/103 mmHg     Pulse Rate 09/08/15 0844 85      Resp 09/08/15 0844 20     Temp 09/08/15 0844 98.4 F (36.9 C)     Temp Source 09/08/15 0844 Oral     SpO2 09/08/15 0844 95 %     Weight 09/08/15 0844 232 lb (105.235 kg)     Height 09/08/15 0844 5\' 8"  (1.727 m)     Head Cir --      Peak Flow --      Pain Score 09/08/15 0849 6     Pain Loc --      Pain Edu? --      Excl. in GC? --      Constitutional: Alert and oriented. Ill appearing but in no distress Eyes: Conjunctivae are normal.  ENT   Head: Normocephalic and atraumatic.   Mouth/Throat: Mucous membranes are moist. Cardiovascular: Normal rate, regular rhythm. Normal and symmetric distal pulses are present in all extremities. No murmurs, rubs, or gallops. Respiratory: Normal respiratory effort without tachypnea nor retractions. Breath sounds are clear and equal bilaterally.  Gastrointestinal: Soft and non-tender in all quadrants. No distention. There is no CVA tenderness. Genitourinary: deferred Musculoskeletal: Nontender with normal range of motion in all extremities. No lower extremity tenderness nor edema. Neurologic:  Normal speech and language. No gross focal neurologic deficits are appreciated. Skin:  Skin is warm, and intact. Diaphoretic. No rash noted. Psychiatric: Mood and affect are normal. Patient exhibits appropriate insight and judgment.  ____________________________________________    LABS (pertinent positives/negatives)  Labs Reviewed  COMPREHENSIVE METABOLIC PANEL - Abnormal; Notable for the following:    Glucose, Bld 121 (*)    Calcium 8.7 (*)    All other components within normal limits  RAPID INFLUENZA A&B ANTIGENS ([facility] ONLY)  CULTURE, BLOOD (ROUTINE X 2)  CULTURE, BLOOD (ROUTINE X 2)  CBC  CK  TROPONIN I    ____________________________________________   EKG  ED ECG REPORT I, Jene Every, the attending physician, personally viewed and interpreted this ECG.  Date: 09/08/2015 EKG Time: 8:51 AM Rate: 85 Rhythm: normal sinus  rhythm QRS Axis: normal Intervals: normal ST/T Wave abnormalities: normal Conduction Disutrbances: none Narrative Interpretation: unremarkable   ____________________________________________    RADIOLOGY I have personally reviewed any xrays that were ordered on this patient: Chest x-ray unremarkable CT scan shows lobar pneumonia ____________________________________________   PROCEDURES  Procedure(s) performed: none  Critical Care performed: none  ____________________________________________   INITIAL IMPRESSION / ASSESSMENT AND PLAN / ED COURSE  Pertinent labs & imaging results that were available during my care of the patient were reviewed by me and considered in my medical decision making (  see chart for details).  Patient presents with cough, mild short of breath and chest discomfort with chills and fever suspicious for pneumonia versus upper respiratory infection versus influenza.  Chest x-ray, labs and influenza are all unremarkable. I have strong suspicion for pneumonia so I will send him for CT without contrast  CT demonstrate lobar pneumonia on the left. Discussed this with the patient. We agree that we will do a dose of IV Levaquin here in the emergency department and then sent him home on by mouth Levaquin as he is already feeling significant better with IV fluids  ____________________________________________   FINAL CLINICAL IMPRESSION(S) / ED DIAGNOSES  Final diagnoses:  Community acquired pneumonia     Jene Every, MD 09/08/15 1523

## 2015-09-08 NOTE — Discharge Instructions (Signed)

## 2015-09-08 NOTE — ED Notes (Signed)
Pt since Friday reports cold symptoms states he has SOB, chest pain, congestion &cough brown sputum. Pt reports sweats and chills. Pt seen at doctors office Friday diagnosed with bronchitis. Prescribed Amoxicillin Friday continuing medication.

## 2015-09-12 ENCOUNTER — Encounter: Payer: Self-pay | Admitting: Physician Assistant

## 2015-09-12 ENCOUNTER — Ambulatory Visit: Payer: Self-pay | Admitting: Physician Assistant

## 2015-09-12 VITALS — BP 139/80 | HR 78 | Temp 98.0°F

## 2015-09-12 DIAGNOSIS — J189 Pneumonia, unspecified organism: Secondary | ICD-10-CM

## 2015-09-12 MED ORDER — IPRATROPIUM-ALBUTEROL 0.5-2.5 (3) MG/3ML IN SOLN
3.0000 mL | Freq: Once | RESPIRATORY_TRACT | Status: AC
  Administered 2015-09-12: 3 mL via RESPIRATORY_TRACT

## 2015-09-12 NOTE — Progress Notes (Signed)
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

## 2015-09-17 ENCOUNTER — Encounter: Payer: Self-pay | Admitting: Family Medicine

## 2015-09-17 ENCOUNTER — Ambulatory Visit (INDEPENDENT_AMBULATORY_CARE_PROVIDER_SITE_OTHER): Payer: Managed Care, Other (non HMO) | Admitting: Family Medicine

## 2015-09-17 VITALS — BP 124/80 | HR 82 | Temp 98.5°F | Wt 240.5 lb

## 2015-09-17 DIAGNOSIS — J189 Pneumonia, unspecified organism: Secondary | ICD-10-CM

## 2015-09-17 DIAGNOSIS — R911 Solitary pulmonary nodule: Secondary | ICD-10-CM

## 2015-09-17 NOTE — Progress Notes (Signed)
Pre visit review using our clinic review tool, if applicable. No additional management support is needed unless otherwise documented below in the visit note.  ER f/u for PNA. CT imaging reviewed with the patient, re: PNA and nodules.    CT.  Minimal scattered ground-glass opacities within the basilar segment of the left lower lobe, and more confluent area of ground-glass opacity in the superior segment of the left lower lobe. The appearance of these findings support infectious or inflammatory etiology. However short-term follow-up after empiric treatment is recommended to assure resolution as bronchoalveolar carcinoma cannot be excluded, particularly in the superior segment of left lower lobe.  3 mm ground-glass nodule in the lingula. 4 mm perifissural right middle lobe soft tissue nodule, which may represent an intraparenchymal lymph node. If the patient is at high risk for bronchogenic carcinoma, follow-up chest CT at 1 year is recommended. If the patient is at low risk, no follow-up is needed.   Now done with abx.  No cough except for minimal cough at night.  Still with some sputum but clearly improved from prev. No wheeze now.  Can get a deep breath better. D/w pt about prev ER eval and prev clinic notes.  He is a former smoker.   Meds, vitals, and allergies reviewed.   ROS: See HPI.  Otherwise, noncontributory.  GEN: nad, alert and oriented HEENT: mucous membranes moist NECK: supple w/o LA CV: rrr.  PULM: ctab, no inc wob ABD: soft, +bs EXT: no edema

## 2015-09-17 NOTE — Patient Instructions (Signed)
Let me check on the schedule for the follow up imaging.   We'll be in touch.  Take care.  Glad to see you.   Update me as needed.

## 2015-09-18 DIAGNOSIS — J189 Pneumonia, unspecified organism: Secondary | ICD-10-CM | POA: Insufficient documentation

## 2015-09-18 DIAGNOSIS — R911 Solitary pulmonary nodule: Secondary | ICD-10-CM | POA: Insufficient documentation

## 2015-09-18 NOTE — Assessment & Plan Note (Signed)
>  25 minutes spent in face to face time with patient, >50% spent in counselling or coordination of care Clearly improved.  No change in meds at this point.  I'll check on timing of f/u imaging for PNA and nodules.  D/w pt.  He agrees.   D/w pt about ddx of nodules, with CA unlikely and needed for f/u imaging.  He agrees.

## 2015-09-18 NOTE — Assessment & Plan Note (Signed)
I'll check on timing of f/u imaging for PNA and nodules.  D/w pt.  He agrees.   D/w pt about ddx of nodules, with CA unlikely and needed for f/u imaging.  He agrees.

## 2015-09-23 ENCOUNTER — Other Ambulatory Visit: Payer: Self-pay | Admitting: Family Medicine

## 2015-09-23 ENCOUNTER — Telehealth: Payer: Self-pay | Admitting: Family Medicine

## 2015-09-23 DIAGNOSIS — J189 Pneumonia, unspecified organism: Secondary | ICD-10-CM

## 2015-09-23 NOTE — Telephone Encounter (Signed)
Left message on patient's voicemail to return call

## 2015-09-23 NOTE — Telephone Encounter (Signed)
Call pt.  I called rady.  Needs f/u CXR in about 1 month.  That will be 6 weeks out from the pneumonia.   Ordered CXR, can be done here w/o visit being scheduled, ie okay to walk in for that.   Needs f/u CT chest in about 1 year.  I sent myself a reminder note, to schedule in ~09/2016.   We'll schedule that next year.   Thanks.

## 2015-09-24 NOTE — Telephone Encounter (Signed)
Spouse will be bring the xray from 05.  There was a spot on his lung then  She stated if the need to do other xrays or ct scans they are ok with this

## 2015-09-24 NOTE — Telephone Encounter (Signed)
Spouse returned your call Best number to call  [phone]

## 2015-09-24 NOTE — Telephone Encounter (Signed)
Wife advised.  See note below.

## 2015-09-24 NOTE — Telephone Encounter (Signed)
Pt spouse brought in CT [place] completed [place] [id number] and previous chest x-ray in an [facility] gray sleeve.  Best number to call when ready for pickup is [phone].  Xrays placed on Dr. Lianne Bushy desk / lt

## 2015-09-30 NOTE — Telephone Encounter (Signed)
I went back and looked at the films.  I would still get the f/u CXR about 6 weeks out from the pneumonia and the CT in ~09/2016. Thanks.  I put the hard copy films on you desk.

## 2015-09-30 NOTE — Telephone Encounter (Signed)
Left detailed message on voicemail. Films left at front desk for pickup.

## 2015-10-11 ENCOUNTER — Telehealth: Payer: Self-pay | Admitting: Family Medicine

## 2015-10-11 NOTE — Telephone Encounter (Signed)
PLEASE NOTE: All timestamps contained within this report are represented as [place] Standard Time. CONFIDENTIALTY NOTICE: This fax transmission is intended only for the addressee. It contains information that is legally privileged, confidential or otherwise protected from use or disclosure. If you are not the intended recipient, you are strictly prohibited from reviewing, disclosing, copying using or disseminating any of this information or taking any action in reliance on or regarding this information. If you have received this fax in error, please notify us immediately by telephone so that we can arrange for its return to [place]. Phone: [phone], Toll-Free: [phone], Fax: [phone] Page: 1 of 1 Call Id: [id number]  Primary Care [facility] Day - Client TELEPHONE ADVICE RECORD [facility] Patient Name: Frank Strickland DOB: [birth year] Initial Comment Caller states has been coughing a lot the last couple of days. couldn't sleep last night , wants lungs to be checked Nurse Assessment Nurse: Elijah Birk, RN, Stark Bray Date/Time (Eastern Time): 10/11/2015 10:02:20 AM Confirm and document reason for call. If symptomatic, describe symptoms. You must click the next button to save text entered. ---Caller states he has been coughing a lot the last couple of days. He couldn't sleep last night, wants lungs to be checked. Had pneumonia back in January. Told he had some spots on his lungs, has a x ray scheduled the first of March. Has thick, clear mucus he is bringing up, coughing for a week. No fever. States his chest is hurting, is sore. Has the patient traveled out of the country within the last 30 days? ---Not Applicable Does the patient have any new or worsening symptoms? ---Yes Will a triage be completed? ---Yes Related visit to physician within the last 2 weeks? ---No Does the PT have any chronic conditions? (i.e. diabetes, asthma, etc.) ---Yes List chronic conditions.  ---on a blood thinner, on BP rx, has 2 spots on lungs Is this a behavioral health or substance abuse call? ---No Guidelines Guideline Title Affirmed Question Affirmed Notes Cough - Acute Productive Chest pain (Exception: MILD central chest pain, present only when coughing) Final Disposition User Go to ED Now Elijah Birk, RN, Lynda Referrals [facility] - ED Disagree/Comply: Comply

## 2015-10-11 NOTE — Telephone Encounter (Signed)
I'll await the ER notes.  Thanks.  

## 2015-11-08 ENCOUNTER — Ambulatory Visit (INDEPENDENT_AMBULATORY_CARE_PROVIDER_SITE_OTHER)
Admission: RE | Admit: 2015-11-08 | Discharge: 2015-11-08 | Disposition: A | Discharge status: Home / Self Care | Payer: Managed Care, Other (non HMO) | Source: Ambulatory Visit | Attending: Family Medicine | Admitting: Family Medicine

## 2015-11-08 DIAGNOSIS — J189 Pneumonia, unspecified organism: Secondary | ICD-10-CM | POA: Diagnosis not present

## 2015-12-24 ENCOUNTER — Other Ambulatory Visit: Payer: Self-pay | Admitting: Physician Assistant

## 2015-12-24 NOTE — Telephone Encounter (Signed)
Med refill approved 

## 2016-01-07 ENCOUNTER — Emergency Department
Admission: EM | Admit: 2016-01-07 | Discharge: 2016-01-07 | Disposition: A | Discharge status: Home / Self Care | Payer: Managed Care, Other (non HMO) | Attending: Emergency Medicine | Admitting: Emergency Medicine

## 2016-01-07 DIAGNOSIS — E785 Hyperlipidemia, unspecified: Secondary | ICD-10-CM | POA: Diagnosis not present

## 2016-01-07 DIAGNOSIS — Z7951 Long term (current) use of inhaled steroids: Secondary | ICD-10-CM | POA: Diagnosis not present

## 2016-01-07 DIAGNOSIS — I10 Essential (primary) hypertension: Secondary | ICD-10-CM | POA: Diagnosis not present

## 2016-01-07 DIAGNOSIS — N419 Inflammatory disease of prostate, unspecified: Secondary | ICD-10-CM | POA: Diagnosis not present

## 2016-01-07 DIAGNOSIS — Z87891 Personal history of nicotine dependence: Secondary | ICD-10-CM | POA: Insufficient documentation

## 2016-01-07 DIAGNOSIS — Z79899 Other long term (current) drug therapy: Secondary | ICD-10-CM | POA: Insufficient documentation

## 2016-01-07 DIAGNOSIS — R102 Pelvic and perineal pain: Secondary | ICD-10-CM | POA: Diagnosis present

## 2016-01-07 LAB — URINALYSIS COMPLETE WITH MICROSCOPIC (ARMC ONLY)
Bacteria, UA: NONE SEEN
Bilirubin Urine: NEGATIVE
Glucose, UA: NEGATIVE mg/dL
Hgb urine dipstick: NEGATIVE
Ketones, ur: NEGATIVE mg/dL
Leukocytes, UA: NEGATIVE
Nitrite: NEGATIVE
Protein, ur: NEGATIVE mg/dL
Specific Gravity, Urine: 1.028 (ref 1.005–1.030)
Squamous Epithelial / LPF: NONE SEEN
pH: 5 (ref 5.0–8.0)

## 2016-01-07 LAB — BASIC METABOLIC PANEL
Anion gap: 5 (ref 5–15)
BUN: 23 mg/dL — ABNORMAL HIGH (ref 6–20)
CO2: 25 mmol/L (ref 22–32)
Calcium: 8.4 mg/dL — ABNORMAL LOW (ref 8.9–10.3)
Chloride: 105 mmol/L (ref 101–111)
Creatinine, Ser: 0.71 mg/dL (ref 0.61–1.24)
GFR calc Af Amer: >60 mL/min (ref >60)
GFR calc non Af Amer: >60 mL/min (ref >60)
Glucose, Bld: 94 mg/dL (ref 65–99)
Potassium: 3.8 mmol/L (ref 3.5–5.1)
Sodium: 135 mmol/L (ref 135–145)

## 2016-01-07 LAB — HEPATIC FUNCTION PANEL
ALT: 16 U/L — ABNORMAL LOW (ref 17–63)
AST: 27 U/L (ref 15–41)
Albumin: 3.9 g/dL (ref 3.5–5.0)
Alkaline Phosphatase: 63 U/L (ref 38–126)
Bilirubin, Direct: 0.1 mg/dL (ref 0.1–0.5)
Indirect Bilirubin: 1 mg/dL — ABNORMAL HIGH (ref 0.3–0.9)
Total Bilirubin: 1.1 mg/dL (ref 0.3–1.2)
Total Protein: 6.5 g/dL (ref 6.5–8.1)

## 2016-01-07 LAB — CBC
HCT: 39.6 % — ABNORMAL LOW (ref 40.0–52.0)
Hemoglobin: 14 g/dL (ref 13.0–18.0)
MCH: 32.7 pg (ref 26.0–34.0)
MCHC: 35.5 g/dL (ref 32.0–36.0)
MCV: 92.2 fL (ref 80.0–100.0)
Platelets: 144 10*3/uL — ABNORMAL LOW (ref 150–440)
RBC: 4.29 MIL/uL — ABNORMAL LOW (ref 4.40–5.90)
RDW: 12.3 % (ref 11.5–14.5)
WBC: 10.7 10*3/uL — ABNORMAL HIGH (ref 3.8–10.6)

## 2016-01-07 LAB — LIPASE, BLOOD: Lipase: 25 U/L (ref 11–51)

## 2016-01-07 MED ORDER — CIPROFLOXACIN HCL 500 MG PO TABS: 500.0000 mg | ORAL_TABLET | Freq: Two times a day (BID) | ORAL | Status: DC

## 2016-01-07 MED ORDER — NAPROXEN 500 MG PO TABS: 500.0000 mg | ORAL_TABLET | Freq: Two times a day (BID) | ORAL | Status: DC

## 2016-01-07 NOTE — ED Notes (Signed)
Pt in with co rectal pain hx of hemorrhoids but now is having rlq pain.  Denies any n.v.d or dysuria, hx of prostititis worse when he walks.

## 2016-01-07 NOTE — Discharge Instructions (Signed)

## 2016-01-07 NOTE — ED Notes (Signed)
Pt discharged home after verbalizing understanding of discharge instructions; nad noted. 

## 2016-01-07 NOTE — ED Provider Notes (Signed)
[facility] Emergency Department Provider Note  ____________________________________________    I have reviewed the triage vital signs and the nursing notes.   HISTORY  Chief Complaint Abdominal Pain    HPI Frank Strickland is a 50 y.o. male who presents with complaints of moderate to severe rectal discomfort for approximately 1.5 weeks. He notes he also has pain in his pelvis. He describes it as dull and aching. Pain worsens with bowel movement. No fevers or chills. No nausea or vomiting. It feels like he is "sitting on something ". He has a history of hemorrhoids in the past which caused similar pain. He is also had prostatitis in the past.     Past Medical History  Diagnosis Date  . Allergy, unspecified not elsewhere classified   . Hyperlipidemia   . Anxiety   . Duodenal ulcer     2014    Patient Active Problem List   Diagnosis Date Noted  . CAP (community acquired pneumonia) 09/18/2015  . Pulmonary nodule 09/18/2015  . Diarrhea 05/14/2013  . Routine general medical examination at a health care facility 08/17/2012  . Gastrocnemius strain 07/09/2012  . HYPERLIPIDEMIA 10/12/2007  . ESSENTIAL HYPERTENSION, BENIGN 10/10/2007  . ANXIETY DEPRESSION 02/22/2007  . ALLERGY 02/22/2007    Past Surgical History  Procedure Laterality Date  . Kidney stone surgery  2002    X 2 cystoscopy with stent    Current Outpatient Rx  Name  Route  Sig  Dispense  Refill  . fexofenadine-pseudoephedrine (ALLEGRA-D) 60-120 MG 12 hr tablet   Oral   Take 1 tablet by mouth 2 (two) times daily.   30 tablet   0   . FLUoxetine (PROZAC) 20 MG capsule   Oral   Take 1 capsule (20 mg total) by mouth daily. * Needs appointment for additional refills*   30 capsule   6   . fluticasone (FLONASE) 50 MCG/ACT nasal spray   Each Nare   Place 1-2 sprays into both nostrils daily as needed.   16 g   12   . lisinopril (PRINIVIL,ZESTRIL) 5 MG tablet      take 1 tablet by  mouth once daily   30 tablet   6   . Nutritional Supplements (JUICE PLUS FIBRE PO)   Oral   Take 4 tablets by mouth daily.           Allergies Codeine sulfate; Ketorolac tromethamine; and Tessalon  Family History  Problem Relation Age of Onset  . Allergies Mother   . Cancer Father     lung-smoker  . Heart disease Sister   . Hypertension Sister     Social History Social History  Substance Use Topics  . Smoking status: Former Games developer  . Smokeless tobacco: Never Used     Comment: quit 1997  . Alcohol Use: Yes     Comment: Occasional    Review of Systems  Constitutional: Negative for fever.No chills Eyes: Negative for redness ENT: Negative for sore throat Cardiovascular: Negative for chest pain Respiratory: Negative for cough Gastrointestinal: As above Genitourinary: Negative for dysuria. No foul-smelling urine Musculoskeletal: Negative for back pain. Skin: Negative for rash. Neurological: Negative for headache Psychiatric: no anxiety    ____________________________________________   PHYSICAL EXAM:  VITAL SIGNS: ED Triage Vitals  Enc Vitals Group     BP 01/07/16 0556 155/85 mmHg     Pulse Rate 01/07/16 0556 56     Resp 01/07/16 0556 20     Temp 01/07/16 0556  97.7 F (36.5 C)     Temp Source 01/07/16 0556 Oral     SpO2 01/07/16 0556 99 %     Weight 01/07/16 0556 233 lb (105.688 kg)     Height 01/07/16 0556 5\' 10"  (1.778 m)     Head Cir --      Peak Flow --      Pain Score 01/07/16 0557 7     Pain Loc --      Pain Edu? --      Excl. in GC? --      Constitutional: Alert and oriented. Well appearing and in no distress.  Eyes: Conjunctivae are normal. No erythema or injection ENT   Head: Normocephalic and atraumatic.   Mouth/Throat: Mucous membranes are moist. Cardiovascular: Normal rate, regular rhythm. Normal and symmetric distal pulses are present in the upper extremities.  Respiratory: Normal respiratory effort without tachypnea nor  retractions.  Gastrointestinal: Soft and non-tender in all quadrants. No distention. There is no CVA tenderness. No hemorrhoids Genitourinary: Patient with acute discomfort upon gentle palpation of prostate, prostate is enlarged Musculoskeletal: Nontender with normal range of motion in all extremities.  Neurologic:  Normal speech and language. No gross focal neurologic deficits are appreciated. Skin:  Skin is warm, dry and intact. No rash noted. Psychiatric: Mood and affect are normal. Patient exhibits appropriate insight and judgment.  ____________________________________________    LABS (pertinent positives/negatives)  Labs Reviewed  CBC - Abnormal; Notable for the following:    WBC 10.7 (*)    RBC 4.29 (*)    HCT 39.6 (*)    Platelets 144 (*)    All other components within normal limits  BASIC METABOLIC PANEL - Abnormal; Notable for the following:    BUN 23 (*)    Calcium 8.4 (*)    All other components within normal limits  URINALYSIS COMPLETEWITH MICROSCOPIC ([facility] ONLY) - Abnormal; Notable for the following:    Color, Urine YELLOW (*)    APPearance CLEAR (*)    All other components within normal limits  HEPATIC FUNCTION PANEL  LIPASE, BLOOD    ____________________________________________   EKG  None  ____________________________________________    RADIOLOGY  None  ____________________________________________   PROCEDURES  Procedure(s) performed: none  Critical Care performed: none  ____________________________________________   INITIAL IMPRESSION / ASSESSMENT AND PLAN / ED COURSE  Pertinent labs & imaging results that were available during my care of the patient were reviewed by me and considered in my medical decision making (see chart for details).  History of present illness and exam consistent with prostatitis. Urinalysis unremarkable, we will send for culture. Patient is nontoxic with a normal heart rate. He has a very mildly elevated white  blood cell count but is afebrile and again well-appearing. He has close follow-up with urology already arranged. We will treat with 6 weeks of ciprofloxacin and analgesics.  Patient reports he can tolerate Naprosyn  ____________________________________________   FINAL CLINICAL IMPRESSION(S) / ED DIAGNOSES  Final diagnoses:  Prostatitis, unspecified prostatitis type          01/09/16, MD 01/07/16 [phone]

## 2016-01-07 NOTE — ED Notes (Signed)
Pt presents with RLQ and rectal pain x 10 days. Pt states he has hx of prostate issues as well as "ruptured hemorrhoid." Pt reports feeling as if he needs to have a bowel movement most of the time. Dr. Cyril Loosen at bedside; rectal exam performed.

## 2016-01-08 ENCOUNTER — Other Ambulatory Visit: Payer: Self-pay | Admitting: Family Medicine

## 2016-01-08 ENCOUNTER — Telehealth: Payer: Self-pay | Admitting: Family Medicine

## 2016-01-08 ENCOUNTER — Encounter: Payer: Self-pay | Admitting: Physician Assistant

## 2016-01-08 ENCOUNTER — Ambulatory Visit: Payer: Self-pay | Admitting: Physician Assistant

## 2016-01-08 VITALS — BP 140/80 | HR 60 | Temp 98.6°F

## 2016-01-08 DIAGNOSIS — N410 Acute prostatitis: Secondary | ICD-10-CM

## 2016-01-08 DIAGNOSIS — I10 Essential (primary) hypertension: Secondary | ICD-10-CM

## 2016-01-08 LAB — URINE CULTURE: Culture: NO GROWTH

## 2016-01-08 MED ORDER — TAMSULOSIN HCL 0.4 MG PO CAPS: 0.4000 mg | ORAL_CAPSULE | Freq: Every day | ORAL | Status: DC

## 2016-01-08 MED ORDER — AMLODIPINE BESYLATE 5 MG PO TABS: 5.0000 mg | ORAL_TABLET | Freq: Every day | ORAL | Status: DC

## 2016-01-08 NOTE — Telephone Encounter (Signed)
Patient with BP med change, needs f/u OV in about 1 week.  If progressive lip swelling or airway sx, then to ER.  Thanks.

## 2016-01-08 NOTE — Telephone Encounter (Signed)
Spoke to the patient. He does want to come in. He has an appointment with Dr Sheppard Penton in a week. Made him an appointment 01-16-16

## 2016-01-08 NOTE — Telephone Encounter (Signed)
I called the number that is listed as the home number. That is his wife's number. The problem is, there is no DPR that I could find. She told me to try [phone]. I tried that number and it went straight to voice mail

## 2016-01-08 NOTE — Progress Notes (Signed)
S: been having difficulty with lips swelling on and off for awhile, no dif breathing, were swelling prior to taking new meds, no cp/sob, is on lisinopril for htn, also seen in ER for prostatitis , given cipro and naprosyn  O: vitals wnl, nad, lower lip with some angioedema, lungs c t a ,cv rrr  A: angioedema secondary to lisinopril  P: stop lisinopril, start amlodipine 5mg  qd, add flomax for prostatitis and bph sx

## 2016-01-08 NOTE — Telephone Encounter (Signed)
Wife called back and said that another number to try is [phone], he may not answer until tomorrow, but could leave a message

## 2016-01-16 ENCOUNTER — Ambulatory Visit (INDEPENDENT_AMBULATORY_CARE_PROVIDER_SITE_OTHER): Payer: Managed Care, Other (non HMO) | Admitting: Family Medicine

## 2016-01-16 ENCOUNTER — Encounter: Payer: Self-pay | Admitting: Family Medicine

## 2016-01-16 VITALS — BP 116/82 | HR 64 | Temp 98.3°F | Wt 245.5 lb

## 2016-01-16 DIAGNOSIS — I10 Essential (primary) hypertension: Secondary | ICD-10-CM

## 2016-01-16 DIAGNOSIS — N419 Inflammatory disease of prostate, unspecified: Secondary | ICD-10-CM

## 2016-01-16 NOTE — Progress Notes (Signed)
Pre visit review using our clinic review tool, if applicable. No additional management support is needed unless otherwise documented below in the visit note.  H/o prostatitis and now on cipro for 6 weeks with f/u with uro pending.  His pain is better but not fully resolved yet- worse if prolonged sitting.   D/w pt about path/phys.  He is better overall in the meantime.    He was seen with lip swelling.  He stopped lisinopril, then didn't feel well on amlodipine and restarted lisinopril.  D/w pt.  Off CCB now, back on ACE.  B lower lip swelling now mild- it had resolved off the med prev but returned in the meantime.   Zero airway sx.    Meds, vitals, and allergies reviewed.   ROS: Per HPI unless specifically indicated in ROS section   GEN: nad, alert and oriented HEENT: mucous membranes moist, OP wnl internally but externally with mild B lower lip swelling NECK: supple w/o LA, no stridor, no UAN CV: rrr PULM: ctab, no inc wob, no wheeze ABD: soft, +bs EXT: no edema SKIN: no acute rash DRE deferred.

## 2016-01-16 NOTE — Patient Instructions (Signed)
Stop amloipidine.   Don't take lisinopril, ACE inhibitors or ARBs.   Just tell a doc that you had lip swelling and they'll know what to do.  The tick bite site should gradually heal over.  Update me about your BP in about 5-6 days.  If you have any shortness of breath then go to the ER.  Take care.  Glad to see you.

## 2016-01-17 DIAGNOSIS — N419 Inflammatory disease of prostate, unspecified: Secondary | ICD-10-CM | POA: Insufficient documentation

## 2016-01-17 NOTE — Assessment & Plan Note (Addendum)
Stop ACE totally, don't use ARB, d/w pt.  He didn't feel well on amlodipine.   Stop all BP meds for now, he'll update me re: BP in a few days.  If worsening lip swelling, to ER, d/w pt.  Still okay for outpatient f/u.   Also noted- prev tick bite on mid back.  Removed prev.  Unclear if all removed by patient.  No fevers.  No rash.  Tick wasn't engorged.  I checked his back- minimal irritation at the tick bite site.  Unclear if retained material vs small scab, easily removed and rechecked under magnification w/o any foreign material seen in the site.  Reassured patient, he'll update me as needed.

## 2016-01-17 NOTE — Assessment & Plan Note (Signed)
Per uro.  Improved.

## 2016-02-03 ENCOUNTER — Other Ambulatory Visit: Payer: Self-pay | Admitting: Physician Assistant

## 2016-02-03 NOTE — Telephone Encounter (Signed)
Med refill approved 

## 2016-02-10 ENCOUNTER — Ambulatory Visit (INDEPENDENT_AMBULATORY_CARE_PROVIDER_SITE_OTHER): Payer: Managed Care, Other (non HMO) | Admitting: Primary Care

## 2016-02-10 ENCOUNTER — Encounter: Payer: Self-pay | Admitting: Primary Care

## 2016-02-10 VITALS — BP 148/92 | HR 55 | Temp 98.2°F | Wt 241.1 lb

## 2016-02-10 DIAGNOSIS — I10 Essential (primary) hypertension: Secondary | ICD-10-CM | POA: Diagnosis not present

## 2016-02-10 MED ORDER — HYDROCHLOROTHIAZIDE 25 MG PO TABS: 25.0000 mg | ORAL_TABLET | Freq: Every day | ORAL | Status: DC

## 2016-02-10 NOTE — Progress Notes (Signed)
Subjective:    Patient ID: Frank Strickland, male    DOB: 12/05/1965, 50 y.o.   MRN: [id number]  HPI  Frank Strickland is a 50 year old male with a history of hypertension who presents today with a chief complaint of elevated blood pressure. He is currently not managed on medication for hypertension. Based off of his allergy list and the last visit he experienced swelling to his oral cavity while taking lisinopril, and developed headaches on Amlodipine. He was removed from lisinopril and has not experienced swelling to his oral cavity since.  Since he stopped taking blood pressure medication he reports fatigue and headaches. His blood pressure is above goal in the clinic today at 148/92. He's noticed his blood pressure raising for the past 3 weeks. He's checked his blood pressure at home with readings of 140-150/90's on average. He has a family history of hypertension. Overall he just doesn't feel well. He has been on ciprofloxacin for the last several weeks for acute prostatitis per urology.  Review of Systems  Constitutional: Positive for fatigue.  Eyes: Negative for visual disturbance.  Respiratory: Negative for shortness of breath.   Cardiovascular: Negative for chest pain.  Neurological: Positive for dizziness and headaches.       Past Medical History  Diagnosis Date  . Allergy, unspecified not elsewhere classified   . Hyperlipidemia   . Anxiety   . Duodenal ulcer     2014     Social History   Social History  . Marital Status: Married    Spouse Name: N/A  . Number of Children: 3  . Years of Education: N/A   Occupational History  .     Social History Main Topics  . Smoking status: Former Games developer  . Smokeless tobacco: Never Used     Comment: quit 1997  . Alcohol Use: Yes     Comment: Occasional  . Drug Use: No  . Sexual Activity: Not on file   Other Topics Concern  . Not on file   Social History Narrative    Past Surgical History  Procedure Laterality Date  .  Kidney stone surgery  2002    X 2 cystoscopy with stent    Family History  Problem Relation Age of Onset  . Allergies Mother   . Cancer Father     lung-smoker  . Heart disease Sister   . Hypertension Sister     Allergies  Allergen Reactions  . Ace Inhibitors   . Angiotensin Receptor Blockers   . Lisinopril Other (See Comments)    Presumed cause of lip swelling  . Amlodipine Other (See Comments)    intolerant  . Codeine Sulfate     REACTION: headache  . Ketorolac Tromethamine     REACTION: severe headache  . Tessalon [Benzonatate] Other (See Comments)    headache    Current Outpatient Prescriptions on File Prior to Visit  Medication Sig Dispense Refill  . fexofenadine-pseudoephedrine (ALLEGRA-D) 60-120 MG 12 hr tablet Take 1 tablet by mouth 2 (two) times daily. 30 tablet 0  . FLUoxetine (PROZAC) 20 MG capsule take 1 capsule by mouth once daily 30 capsule 6  . fluticasone (FLONASE) 50 MCG/ACT nasal spray Place 1-2 sprays into both nostrils daily as needed. 16 g 12  . naproxen (NAPROSYN) 500 MG tablet Take 1 tablet (500 mg total) by mouth 2 (two) times daily with a meal. 20 tablet 2  . Nutritional Supplements (JUICE PLUS FIBRE PO) Take 4 tablets by mouth  daily.    . omeprazole (PRILOSEC) 40 MG capsule Take 40 mg by mouth daily.   0   No current facility-administered medications on file prior to visit.    BP 148/92 mmHg  Pulse 55  Temp(Src) 98.2 F (36.8 C) (Oral)  Wt 241 lb 1.9 oz (109.371 kg)  SpO2 98%    Objective:   Physical Exam  Constitutional: He appears well-nourished.  Neck: Neck supple.  Cardiovascular: Normal rate and regular rhythm.   Pulmonary/Chest: Effort normal and breath sounds normal.  Skin: Skin is warm and dry.  Psychiatric: He has a normal mood and affect.          Assessment & Plan:

## 2016-02-10 NOTE — Assessment & Plan Note (Signed)
Reaction to lisinopril which he stopped taking in early June. Symptomatic with headaches, dizziness, fatigue since without medication. Experience headaches with amlodipine. Blood pressure above goal in the office and also with him readings over the past several weeks.  Given positive symptoms and elevated readings will initiate hydrochlorothiazide. He will check his blood pressure every day for the next 2 weeks and bring these readings to his PCP for reevaluation. Needs BMP next visit. Precautions provided regarding low and high blood pressure readings.

## 2016-02-10 NOTE — Progress Notes (Signed)
Pre visit review using our clinic review tool, if applicable. No additional management support is needed unless otherwise documented below in the visit note. 

## 2016-02-10 NOTE — Patient Instructions (Signed)
Start hydrochlorothiazide 25 mg tablets for high blood pressure. Take 1 tablet by mouth once every morning.  Check your blood pressure daily, around the same time of day, for the next 2 weeks.  Ensure that you have rested for 30 minutes prior to checking your blood pressure. Record your readings and bring them to your next visit.  Please call us sooner if your blood pressure gets below 100/60, or if your dizziness becomes worse, you feel like you may faint, or you feel worse.  Please schedule a follow up appointment in 2 weeks with Dr. Para March.  It was a pleasure meeting you!

## 2016-03-05 ENCOUNTER — Other Ambulatory Visit: Payer: Self-pay | Admitting: Physician Assistant

## 2016-03-11 ENCOUNTER — Other Ambulatory Visit: Payer: Self-pay | Admitting: Family Medicine

## 2016-03-11 DIAGNOSIS — I10 Essential (primary) hypertension: Secondary | ICD-10-CM

## 2016-03-11 NOTE — Telephone Encounter (Signed)
Received faxed refill for hydrochlorothiazide (HYDRODIURIL) 25 MG tablet. Frank Strickland initiate medication on 02/10/2016. Ok to refill?

## 2016-03-12 MED ORDER — HYDROCHLOROTHIAZIDE 25 MG PO TABS: 25.0000 mg | ORAL_TABLET | Freq: Every day | ORAL | 1 refills | Status: DC

## 2016-03-12 NOTE — Telephone Encounter (Signed)
Sent. How is his BP?  Needs f/u OV with labs at the visit.

## 2016-03-12 NOTE — Telephone Encounter (Signed)
Spoke with wife who says his BP is doing much better ~ 130's/ 80's.  His headaches are better and he is feeling more like himself.  Wife will encourage him to make an appt for FU.

## 2016-03-31 ENCOUNTER — Ambulatory Visit: Payer: Self-pay | Admitting: Physician Assistant

## 2016-03-31 ENCOUNTER — Encounter: Payer: Self-pay | Admitting: Physician Assistant

## 2016-03-31 VITALS — BP 130/80 | HR 64 | Temp 98.8°F

## 2016-03-31 DIAGNOSIS — H1131 Conjunctival hemorrhage, right eye: Secondary | ICD-10-CM

## 2016-03-31 NOTE — Progress Notes (Signed)
S: c/o r eye being red in corner of eye suddenly today, no known injury, no fb, no crusting or drainage, no fever/chills, cough or congestion  O: vitals wnl, nad, perrl eomi, conjunctiva on medial aspect of r eye is red, no matting pus or drainage noted, no fb noted, lungs c t a, cv rrr  A: subconjunctival hemorrhage  P: reassurance, if sx worsening return to clinic, if have matting or crusting call and will call in an antibiotic drop

## 2016-05-03 ENCOUNTER — Other Ambulatory Visit: Payer: Self-pay | Admitting: Physician Assistant

## 2016-05-03 ENCOUNTER — Other Ambulatory Visit: Payer: Self-pay | Admitting: Family Medicine

## 2016-05-03 DIAGNOSIS — I10 Essential (primary) hypertension: Secondary | ICD-10-CM

## 2016-05-04 ENCOUNTER — Encounter: Payer: Self-pay | Admitting: Physician Assistant

## 2016-05-04 ENCOUNTER — Ambulatory Visit: Payer: Self-pay | Admitting: Physician Assistant

## 2016-05-04 VITALS — BP 139/80 | HR 68 | Temp 98.4°F

## 2016-05-04 DIAGNOSIS — J0100 Acute maxillary sinusitis, unspecified: Secondary | ICD-10-CM

## 2016-05-04 MED ORDER — AMOXICILLIN 875 MG PO TABS
875.0000 mg | ORAL_TABLET | Freq: Two times a day (BID) | ORAL | 0 refills | Status: DC
Start: 2016-05-04 — End: 2016-07-01

## 2016-05-04 MED ORDER — OMEPRAZOLE 40 MG PO CPDR: 40.0000 mg | DELAYED_RELEASE_CAPSULE | Freq: Every day | ORAL | 6 refills | Status: DC

## 2016-05-04 NOTE — Progress Notes (Signed)
S: C/o runny nose and congestion for 3-5 days, no fever, chills, cp/sob, v/d; mucus is green and thick, cough is sporadic, c/o of facial and dental pain. Also med refill on omeprazole 40  Using otc meds:   O: PE: vitals wnl, nad, perrl eomi, normocephalic, tms dull, nasal mucosa red and swollen, throat injected, neck supple no lymph, lungs c t a, cv rrr, neuro intact  A:  Acute sinusitis   P: drink fluids, continue regular meds , use otc meds of choice, return if not improving in 5 days, return earlier if worsening  Amoxil, omeprazole, return in 1-2 months for yearly fasting labs

## 2016-05-15 ENCOUNTER — Other Ambulatory Visit: Payer: Self-pay | Admitting: Physician Assistant

## 2016-05-15 DIAGNOSIS — Z299 Encounter for prophylactic measures, unspecified: Secondary | ICD-10-CM

## 2016-05-15 NOTE — Progress Notes (Signed)
Patient came in to have blood drawn for testing per Susan's authorization.  Patient wants lab results to be sent to Dr. Para March at [facility] and he also wants to be called.

## 2016-05-16 LAB — CMP12+LP+TP+TSH+6AC+PSA+CBC…
ALT: 19 IU/L (ref 0–44)
AST: 38 IU/L (ref 0–40)
Albumin/Globulin Ratio: 2 (ref 1.2–2.2)
Albumin: 4.2 g/dL (ref 3.5–5.5)
Alkaline Phosphatase: 65 IU/L (ref 39–117)
BUN/Creatinine Ratio: 28 — ABNORMAL HIGH (ref 9–20)
BUN: 21 mg/dL (ref 6–24)
Basophils Absolute: 0 10*3/uL (ref 0.0–0.2)
Basos: 0 %
Bilirubin Total: 1.3 mg/dL — ABNORMAL HIGH (ref 0.0–1.2)
Calcium: 8.6 mg/dL — ABNORMAL LOW (ref 8.7–10.2)
Chloride: 101 mmol/L (ref 96–106)
Chol/HDL Ratio: 4.7 ratio units (ref 0.0–5.0)
Cholesterol, Total: 235 mg/dL — ABNORMAL HIGH (ref 100–199)
Creatinine, Ser: 0.74 mg/dL — ABNORMAL LOW (ref 0.76–1.27)
EOS (ABSOLUTE): 0.2 10*3/uL (ref 0.0–0.4)
Eos: 3 %
Estimated CHD Risk: 0.9 times avg. (ref 0.0–1.0)
Free Thyroxine Index: 1.4 (ref 1.2–4.9)
GFR calc Af Amer: 123 mL/min/{1.73_m2} (ref >59)
GFR calc non Af Amer: 107 mL/min/{1.73_m2} (ref >59)
GGT: 23 IU/L (ref 0–65)
Globulin, Total: 2.1 g/dL (ref 1.5–4.5)
Glucose: 94 mg/dL (ref 65–99)
HDL: 50 mg/dL (ref >39)
Hematocrit: 42.2 % (ref 37.5–51.0)
Hemoglobin: 15.1 g/dL (ref 12.6–17.7)
Immature Grans (Abs): 0 10*3/uL (ref 0.0–0.1)
Immature Granulocytes: 0 %
Iron: 165 ug/dL (ref 38–169)
LDH: 226 IU/L — ABNORMAL HIGH (ref 121–224)
LDL Calculated: 162 mg/dL — ABNORMAL HIGH (ref 0–99)
Lymphocytes Absolute: 2.3 10*3/uL (ref 0.7–3.1)
Lymphs: 38 %
MCH: 32.5 pg (ref 26.6–33.0)
MCHC: 35.8 g/dL — ABNORMAL HIGH (ref 31.5–35.7)
MCV: 91 fL (ref 79–97)
Monocytes Absolute: 0.5 10*3/uL (ref 0.1–0.9)
Monocytes: 8 %
Neutrophils Absolute: 3.2 10*3/uL (ref 1.4–7.0)
Neutrophils: 51 %
Phosphorus: 3.4 mg/dL (ref 2.5–4.5)
Platelets: 182 10*3/uL (ref 150–379)
Potassium: 4 mmol/L (ref 3.5–5.2)
Prostate Specific Ag, Serum: 0.4 ng/mL (ref 0.0–4.0)
RBC: 4.64 x10E6/uL (ref 4.14–5.80)
RDW: 12.9 % (ref 12.3–15.4)
Sodium: 138 mmol/L (ref 134–144)
T3 Uptake Ratio: 25 % (ref 24–39)
T4, Total: 5.6 ug/dL (ref 4.5–12.0)
TSH: 1.69 u[IU]/mL (ref 0.450–4.500)
Total Protein: 6.3 g/dL (ref 6.0–8.5)
Triglycerides: 116 mg/dL (ref 0–149)
Uric Acid: 4.8 mg/dL (ref 3.7–8.6)
VLDL Cholesterol Cal: 23 mg/dL (ref 5–40)
WBC: 6.1 10*3/uL (ref 3.4–10.8)

## 2016-05-16 LAB — HEPATITIS C ANTIBODY (REFLEX): HCV Ab: 0.1 s/co ratio (ref 0.0–0.9)

## 2016-05-16 LAB — HCV COMMENT:

## 2016-05-16 LAB — HGB A1C W/O EAG: Hgb A1c MFr Bld: 5.6 % (ref 4.8–5.6)

## 2016-05-16 LAB — VITAMIN D 25 HYDROXY (VIT D DEFICIENCY, FRACTURES): Vit D, 25-Hydroxy: 26.6 ng/mL — ABNORMAL LOW (ref 30.0–100.0)

## 2016-06-23 ENCOUNTER — Other Ambulatory Visit (INDEPENDENT_AMBULATORY_CARE_PROVIDER_SITE_OTHER): Payer: Managed Care, Other (non HMO)

## 2016-06-23 DIAGNOSIS — I10 Essential (primary) hypertension: Secondary | ICD-10-CM

## 2016-06-23 LAB — COMPREHENSIVE METABOLIC PANEL
ALT: 21 U/L (ref 0–53)
AST: 26 U/L (ref 0–37)
Albumin: 4.1 g/dL (ref 3.5–5.2)
Alkaline Phosphatase: 52 U/L (ref 39–117)
BUN: 19 mg/dL (ref 6–23)
CO2: 29 mEq/L (ref 19–32)
Calcium: 8.9 mg/dL (ref 8.4–10.5)
Chloride: 104 mEq/L (ref 96–112)
Creatinine, Ser: 0.8 mg/dL (ref 0.40–1.50)
GFR: 107.94 mL/min (ref >60.00)
Glucose, Bld: 95 mg/dL (ref 70–99)
Potassium: 3.8 mEq/L (ref 3.5–5.1)
Sodium: 139 mEq/L (ref 135–145)
Total Bilirubin: 1.1 mg/dL (ref 0.2–1.2)
Total Protein: 6.4 g/dL (ref 6.0–8.3)

## 2016-06-26 ENCOUNTER — Other Ambulatory Visit: Payer: Self-pay | Admitting: Physician Assistant

## 2016-07-01 ENCOUNTER — Encounter: Payer: Self-pay | Admitting: Family Medicine

## 2016-07-01 ENCOUNTER — Ambulatory Visit (INDEPENDENT_AMBULATORY_CARE_PROVIDER_SITE_OTHER): Payer: Managed Care, Other (non HMO) | Admitting: Family Medicine

## 2016-07-01 VITALS — BP 140/86 | HR 66 | Temp 98.6°F | Ht 70.0 in | Wt 235.8 lb

## 2016-07-01 DIAGNOSIS — Z0000 Encounter for general adult medical examination without abnormal findings: Secondary | ICD-10-CM

## 2016-07-01 DIAGNOSIS — R911 Solitary pulmonary nodule: Secondary | ICD-10-CM

## 2016-07-01 DIAGNOSIS — E785 Hyperlipidemia, unspecified: Secondary | ICD-10-CM

## 2016-07-01 DIAGNOSIS — K219 Gastro-esophageal reflux disease without esophagitis: Secondary | ICD-10-CM

## 2016-07-01 DIAGNOSIS — E559 Vitamin D deficiency, unspecified: Secondary | ICD-10-CM

## 2016-07-01 DIAGNOSIS — N529 Male erectile dysfunction, unspecified: Secondary | ICD-10-CM

## 2016-07-01 DIAGNOSIS — F341 Dysthymic disorder: Secondary | ICD-10-CM

## 2016-07-01 MED ORDER — CHOLECALCIFEROL 25 MCG (1000 UT) PO TBDP
1.0000 | ORAL_TABLET | Freq: Every day | ORAL | Status: AC
Start: 2016-07-01 — End: ?

## 2016-07-01 NOTE — Progress Notes (Signed)
Pre visit review using our clinic review tool, if applicable. No additional management support is needed unless otherwise documented below in the visit note. 

## 2016-07-01 NOTE — Progress Notes (Signed)
CPE- See plan.  Routine anticipatory guidance given to patient.  See health maintenance. Mother died about 2 years ago, I updated chart and offered condolences.   Tetanus tetanus shot done 2015 Flu done 2017 already, done 2 weeks ago.   PNA not due d/w pt.  Shingles not due d/w pt.  Living will d/w pt. Would have his wife designated if patient were incapacitated.  Prostate cancer screening- PSA wnl Colonoscopy 2014.   Diet and exercise d/w pt.  Labs d/w pt.  See below.  Walking more at work.  Lifting some weights.    Mood is good on SSRI.  No clear ADE from med.  See below re: ED.  ED didn't start with SSRI use.  He wants to continue SSRI.   ED.  Gradual changes.  Not needing medical tx at this point.  He'll update me as needed.  D/w pt.   H/o stomach ulcer, still on PPI.  Some irritation with spicy foods.  Off nsaids.   HLD.  Not on statin.  Labs d/w pt.    H/o pulmonary nodule. Due for f/u CT scan in 08/2016.  We'll work on that later, d/w pt.   Hypertension:    Using medication without problems or lightheadedness: yes Chest pain with exertion:no Edema:no Short of breath:no Labs d/w pt.   PMH and SH reviewed  Meds, vitals, and allergies reviewed.   ROS: Per HPI.  Unless specifically indicated otherwise in HPI, the patient denies:  General: fever. Eyes: acute vision changes ENT: sore throat Cardiovascular: chest pain Respiratory: SOB GI: vomiting GU: dysuria Musculoskeletal: acute back pain Derm: acute rash Neuro: acute motor dysfunction Psych: worsening mood Endocrine: polydipsia Heme: bleeding Allergy: hayfever  GEN: nad, alert and oriented HEENT: mucous membranes moist NECK: supple w/o LA CV: rrr. PULM: ctab, no inc wob ABD: soft, +bs EXT: no edema SKIN: no acute rash

## 2016-07-01 NOTE — Patient Instructions (Addendum)
Add on vitamin D.  You can get it over the counter.  Keep working on your weight.  Recheck lipids in about 6 months.   Fasting lab visit.  Try taking omeprazole twice a day for about 2 weeks.  If stomach troubles are better, then cut back to once a day.  If not better, then update me so we can set you up with the GI clinic.  We'll set up the CT scan of your lungs in 08/2016.  Call us if you don't hear from [place] first.  Take care.  Glad to see you.  Update me as needed.

## 2016-07-02 DIAGNOSIS — K219 Gastro-esophageal reflux disease without esophagitis: Secondary | ICD-10-CM | POA: Insufficient documentation

## 2016-07-02 DIAGNOSIS — E559 Vitamin D deficiency, unspecified: Secondary | ICD-10-CM | POA: Insufficient documentation

## 2016-07-02 DIAGNOSIS — N529 Male erectile dysfunction, unspecified: Secondary | ICD-10-CM | POA: Insufficient documentation

## 2016-07-02 NOTE — Assessment & Plan Note (Signed)
Tetanus tetanus shot done 2015 Flu done 2017 already, done 2 weeks ago.   PNA not due d/w pt.  Shingles not due d/w pt.  Living will d/w pt. Would have his wife designated if patient were incapacitated.  Prostate cancer screening- PSA wnl Colonoscopy 2014.   Diet and exercise d/w pt.  Labs d/w pt.  See below.  Walking more at work.  Lifting some weights.

## 2016-07-02 NOTE — Assessment & Plan Note (Signed)
Work on diet and exercise. Recheck in about 6 months.

## 2016-07-02 NOTE — Assessment & Plan Note (Signed)
Labs discussed with patient. Add on over-the-counter vitamin D. See after visit summary.

## 2016-07-02 NOTE — Assessment & Plan Note (Signed)
Not yet due for follow-up imaging.

## 2016-07-02 NOTE — Assessment & Plan Note (Signed)
Still on PPI. Discussed with patient about diet and exercise. Still with some breakthrough symptoms suggestive of heartburn. Increase PPI to twice a day for about 14 days. If not better, or if symptoms return when he goes back to once a day dosing thereafter, then let me know so we can set him up with the GI clinic. He agrees. No black stools. Not vomiting blood. No blood in stool. Okay for outpatient follow-up.

## 2016-07-02 NOTE — Assessment & Plan Note (Signed)
SSRI likely not contributing to erectile dysfunction,  can continue current medication. Mood is good otherwise.

## 2016-07-02 NOTE — Assessment & Plan Note (Signed)
Not at the point of wanting medical treatment. He will update me as needed. Discussed with patient about diet and exercise and weight loss.

## 2016-08-05 ENCOUNTER — Ambulatory Visit: Payer: Self-pay | Admitting: Physician Assistant

## 2016-08-05 ENCOUNTER — Encounter: Payer: Self-pay | Admitting: Physician Assistant

## 2016-08-05 VITALS — BP 120/80 | HR 64 | Temp 98.0°F

## 2016-08-05 DIAGNOSIS — J0100 Acute maxillary sinusitis, unspecified: Secondary | ICD-10-CM

## 2016-08-05 MED ORDER — PREDNISONE 10 MG PO TABS: 30.0000 mg | ORAL_TABLET | Freq: Every day | ORAL | 0 refills | Status: DC

## 2016-08-05 MED ORDER — AZITHROMYCIN 250 MG PO TABS: ORAL_TABLET | ORAL | 0 refills | Status: DC

## 2016-08-05 NOTE — Progress Notes (Signed)
S: C/o runny nose and congestion for 3 days, + sinus headache,  no fever, chills, cp/sob, v/d; mucus is green and thick, cough is sporadic, c/o of facial and dental pain. States he was cutting down a door the other day and there was a lot of sawdust and dust in the air, had on a mask but had a ton of debris on him afterwards  Using otc meds:   O: PE: vtials wnl, nad, perrl eomi, normocephalic, tms dull, nasal mucosa red and swollen, throat injected, neck supple no lymph, lungs c t a, cv rrr, neuro intact  A:  Acute sinusitis   P: drink fluids, continue regular meds , use otc meds of choice, return if not improving in 5 days, return earlier if worsening , zpack , pred 30mg  qd x 3d, saline nasal rinse, use daily especially after work to keep sinuses cleared out

## 2016-08-17 ENCOUNTER — Other Ambulatory Visit: Payer: Self-pay | Admitting: Family Medicine

## 2016-08-17 DIAGNOSIS — R911 Solitary pulmonary nodule: Secondary | ICD-10-CM

## 2016-08-17 NOTE — Progress Notes (Signed)
Due for f/u pulmonary nodule f/u with CT.  Ordered.  Thanks.  Wife advised and asks that the home number be called to set it up.  Ninetta Lights, CMA  08/18/16

## 2016-08-30 ENCOUNTER — Other Ambulatory Visit: Payer: Self-pay | Admitting: Physician Assistant

## 2016-08-30 ENCOUNTER — Other Ambulatory Visit: Payer: Self-pay | Admitting: Family Medicine

## 2016-08-30 DIAGNOSIS — I10 Essential (primary) hypertension: Secondary | ICD-10-CM

## 2016-08-31 NOTE — Telephone Encounter (Signed)
Med refill approved 

## 2016-09-07 ENCOUNTER — Ambulatory Visit: Payer: Managed Care, Other (non HMO)

## 2016-09-10 ENCOUNTER — Ambulatory Visit
Admission: RE | Admit: 2016-09-10 | Discharge: 2016-09-10 | Disposition: A | Discharge status: Home / Self Care | Payer: Managed Care, Other (non HMO) | Source: Ambulatory Visit | Attending: Family Medicine | Admitting: Family Medicine

## 2016-09-10 DIAGNOSIS — R911 Solitary pulmonary nodule: Secondary | ICD-10-CM

## 2016-09-10 DIAGNOSIS — R918 Other nonspecific abnormal finding of lung field: Secondary | ICD-10-CM | POA: Insufficient documentation

## 2016-09-14 ENCOUNTER — Telehealth: Payer: Self-pay | Admitting: Family Medicine

## 2016-09-14 NOTE — Telephone Encounter (Signed)
Patient returned call about his CT results.

## 2016-09-14 NOTE — Telephone Encounter (Signed)
Returned patient's call and test results were given.

## 2016-11-02 ENCOUNTER — Ambulatory Visit (INDEPENDENT_AMBULATORY_CARE_PROVIDER_SITE_OTHER): Payer: Managed Care, Other (non HMO) | Admitting: Family Medicine

## 2016-11-02 ENCOUNTER — Ambulatory Visit (INDEPENDENT_AMBULATORY_CARE_PROVIDER_SITE_OTHER)
Admission: RE | Admit: 2016-11-02 | Discharge: 2016-11-02 | Disposition: A | Discharge status: Home / Self Care | Payer: Managed Care, Other (non HMO) | Source: Ambulatory Visit | Attending: Family Medicine | Admitting: Family Medicine

## 2016-11-02 ENCOUNTER — Encounter: Payer: Self-pay | Admitting: Family Medicine

## 2016-11-02 ENCOUNTER — Encounter (INDEPENDENT_AMBULATORY_CARE_PROVIDER_SITE_OTHER): Payer: Self-pay

## 2016-11-02 VITALS — BP 112/80 | HR 60 | Temp 98.5°F | Ht 70.0 in | Wt 242.5 lb

## 2016-11-02 DIAGNOSIS — M79645 Pain in left finger(s): Secondary | ICD-10-CM | POA: Diagnosis not present

## 2016-11-02 DIAGNOSIS — M2550 Pain in unspecified joint: Secondary | ICD-10-CM

## 2016-11-02 DIAGNOSIS — M79644 Pain in right finger(s): Secondary | ICD-10-CM | POA: Diagnosis not present

## 2016-11-02 DIAGNOSIS — M25562 Pain in left knee: Secondary | ICD-10-CM

## 2016-11-02 LAB — SEDIMENTATION RATE: Sed Rate: 3 mm/hr (ref 0–20)

## 2016-11-02 LAB — HIGH SENSITIVITY CRP: CRP, High Sensitivity: 2.73 mg/L (ref 0.000–5.000)

## 2016-11-02 MED ORDER — METHYLPREDNISOLONE ACETATE 40 MG/ML IJ SUSP
80.0000 mg | Freq: Once | INTRAMUSCULAR | Status: AC
  Administered 2016-11-02: 80 mg via INTRA_ARTICULAR

## 2016-11-02 NOTE — Progress Notes (Signed)
Pre visit review using our clinic review tool, if applicable. No additional management support is needed unless otherwise documented below in the visit note. 

## 2016-11-02 NOTE — Progress Notes (Signed)
Dr. Karleen Hampshire T. Scotty Weigelt, MD, CAQ Sports Medicine [facility] [address] [place], [address] Phone: [phone] Fax: [phone]  11/02/2016  Patient: Frank Strickland, MRN: [id number], DOB: 07-03-1966, 51 y.o.  Primary Physician:  Crawford Givens, MD   Chief Complaint  Patient presents with  . Knee Pain    Left   Subjective:   BLAIKE Strickland is a 51 y.o. very pleasant male patient who presents with the following:  L knee pain: Acting up for about a week. Pain to the point of weakness and a lot of crepitus. He has a mild effusion, and he is occasionally had a sensation of giving way.  He does have some medial pain primarily.  No prior operative intervention in the affected knee.  He also is having some diffuse swelling and changes in both of his hands.  These around both his knuckles to a relatively extensive amount for a 51 year old gentleman.  Past Medical History, Surgical History, Social History, Family History, Problem List, Medications, and Allergies have been reviewed and updated if relevant.  Patient Active Problem List   Diagnosis Date Noted  . Vitamin D deficiency 07/02/2016  . Erectile dysfunction 07/02/2016  . GERD (gastroesophageal reflux disease) 07/02/2016  . Prostatitis 01/17/2016  . Pulmonary nodule 09/18/2015  . Routine general medical examination at a health care facility 08/17/2012  . HLD (hyperlipidemia) 10/12/2007  . ESSENTIAL HYPERTENSION, BENIGN 10/10/2007  . ANXIETY DEPRESSION 02/22/2007  . ALLERGY 02/22/2007    Past Medical History:  Diagnosis Date  . Allergy, unspecified not elsewhere classified   . Anxiety   . Duodenal ulcer    2014  . Hyperlipidemia   . Hypertension     Past Surgical History:  Procedure Laterality Date  . KIDNEY STONE SURGERY  2002   X 2 cystoscopy with stent    Social History   Social History  . Marital status: Married    Spouse name: N/A  . Number of children: 3  . Years of  education: N/A   Occupational History  .  [employer]   Social History Main Topics  . Smoking status: Former Games developer  . Smokeless tobacco: Never Used     Comment: quit 1997  . Alcohol use Yes     Comment: Occasional  . Drug use: No  . Sexual activity: Not on file   Other Topics Concern  . Not on file   Social History Narrative   remarried, 1991   1 child, 2 step kids   Works for [employer], Music therapist    Family History  Problem Relation Age of Onset  . Allergies Mother   . Cancer Father     lung-smoker  . Heart disease Sister   . Hypertension Sister     Allergies  Allergen Reactions  . Ace Inhibitors   . Angiotensin Receptor Blockers   . Lisinopril Other (See Comments)    Presumed cause of lip swelling  . Amlodipine Other (See Comments)    intolerant  . Codeine Sulfate     REACTION: headache  . Ketorolac Tromethamine     REACTION: severe headache  . Tessalon [Benzonatate] Other (See Comments)    headache    Medication list reviewed and updated in full in [place] [facility].  GEN: No fevers, chills. Nontoxic. Primarily MSK c/o today. MSK: Detailed in the HPI GI: tolerating PO intake without difficulty Neuro: No numbness, parasthesias, or tingling associated. Otherwise the pertinent positives of the  ROS are noted above.   Objective:   BP 112/80   Pulse 60   Temp 98.5 F (36.9 C) (Oral)   Ht 5\' 10"  (1.778 m)   Wt 242 lb 8 oz (110 kg)   BMI 34.80 kg/m    GEN: WDWN, NAD, Non-toxic, Alert & Oriented x 3 HEENT: Atraumatic, Normocephalic.  Ears and Nose: No external deformity. EXTR: No clubbing/cyanosis/edema NEURO: Normal gait.  PSYCH: Normally interactive. Conversant. Not depressed or anxious appearing.  Calm demeanor.   Knee:  L Gait: Normal heel toe pattern ROM: 0-110 Effusion: mild Echymosis or edema: none Patellar tendon NT Painful PLICA: neg Patellar grind: negative Medial and lateral patellar facet loading: negative medial and  lateral joint lines medial Mcmurray's pain Flexion-pinch pain Varus and valgus stress: stable Lachman: neg Ant and Post drawer: neg Hip abduction, IR, ER: WNL Hip flexion str: 5/5 Hip abd: 5/5 Quad: 5/5 VMO atrophy:No Hamstring concentric and eccentric: 5/5   Multiple swollen knuckle surround the PIP, DIP, and MCP joints bilaterally.  Radiology: Dg Knee Ap/lat W/sunrise Left  Result Date: 11/02/2016 CLINICAL DATA:  Left knee pain EXAM: LEFT KNEE 3 VIEWS COMPARISON:  None. FINDINGS: Mild joint space narrowing in all 3 compartments. Early spurring along the tibial spines. Trace joint effusion. No fracture, subluxation or dislocation. Sent area of cortical thickening noted along the proximal tibial shaft. Cortical thickening also noted within the proximal to mid fibula. These may be related to old injury. This has a benign appearance. IMPRESSION: No acute bony abnormality. Early degenerative changes in the left knee with small joint effusion. Electronically Signed   By: 11/04/2016 M.D.   On: 11/02/2016 10:38    Results for orders placed or performed in visit on 11/02/16  Cyclic citrul peptide antibody, IgG  Result Value Ref Range   Cyclic Citrullin Peptide Ab 20 (H) Units  Sedimentation rate  Result Value Ref Range   Sed Rate 3 0 - 20 mm/hr  Rheumatoid factor  Result Value Ref Range   Rhuematoid fact SerPl-aCnc 19 (H) <14 IU/mL  High sensitivity CRP  Result Value Ref Range   CRP, High Sensitivity 2.730 0.000 - 5.000 mg/L  ANA  Result Value Ref Range   Anit Nuclear Antibody(ANA) POS (A) NEGATIVE  Anti-DNA antibody, double-stranded  Result Value Ref Range   ds DNA Ab <1 IU/mL  Anti-nuclear ab-titer (ANA titer)  Result Value Ref Range   ANA Pattern 1 HOMOGENEOUS (A)    ANA Titer 1 1:160 (H) titer     Assessment and Plan:   Acute pain of left knee - Plan: DG Knee AP/LAT W/Sunrise Left, methylPREDNISolone acetate (DEPO-MEDROL) injection 80 mg  Polyarthralgia - Plan: Cyclic  citrul peptide antibody, IgG, Sedimentation rate, Rheumatoid factor, High sensitivity CRP, ANA, Anti-DNA antibody, double-stranded  Pain in finger of both hands - Plan: Cyclic citrul peptide antibody, IgG, Sedimentation rate, Rheumatoid factor, High sensitivity CRP, ANA, Anti-DNA antibody, double-stranded  Suspected meniscal tear and a 51 year old gentleman.  We will try to treat this conservatively with oral anti-inflammatories, ice, knee bracing, and I'm going to do an intra-articular injection.  Given concern, we checked multiple rheumatological laboratories, and the patient's rheumatoid factor, CCP antibodies and and they all came back positive.  I will have to discuss this with him over the phone, and I will recommend rheumatological involvement.  Knee Injection, L Patient verbally consented to procedure. Risks (including potential rare risk of infection), benefits, and alternatives explained. Sterilely prepped with Chloraprep. Ethyl cholride used  for anesthesia. 8 cc Lidocaine 1% mixed with 2 mL Depo-Medrol 40 mg injected using the anteromedial approach without difficulty. No complications with procedure and tolerated well. Patient had decreased pain post-injection.   Follow-up: No Follow-up on file.  Meds ordered this encounter  Medications  . methylPREDNISolone acetate (DEPO-MEDROL) injection 80 mg   Medications Discontinued During This Encounter  Medication Reason  . predniSONE (DELTASONE) 10 MG tablet Completed Course  . fexofenadine-pseudoephedrine (ALLEGRA-D) 60-120 MG 12 hr tablet Completed Course  . azithromycin (ZITHROMAX Z-PAK) 250 MG tablet Completed Course   Orders Placed This Encounter  Procedures  . DG Knee AP/LAT W/Sunrise Left  . Cyclic citrul peptide antibody, IgG  . Sedimentation rate  . Rheumatoid factor  . High sensitivity CRP  . ANA  . Anti-DNA antibody, double-stranded  . Anti-nuclear ab-titer (ANA titer)    Signed,  Omega Durante T. Debbe Crumble,  MD   Allergies as of 11/02/2016      Reactions   Ace Inhibitors    Angiotensin Receptor Blockers    Lisinopril Other (See Comments)   Presumed cause of lip swelling   Amlodipine Other (See Comments)   intolerant   Codeine Sulfate    REACTION: headache   Ketorolac Tromethamine    REACTION: severe headache   Tessalon [benzonatate] Other (See Comments)   headache      Medication List       Accurate as of 11/02/16 11:59 PM. Always use your most recent med list.          Cholecalciferol 1000 units Tbdp Take 1 tablet by mouth daily.   FLUoxetine 20 MG capsule Commonly known as:  PROZAC take 1 capsule by mouth once daily   fluticasone 50 MCG/ACT nasal spray Commonly known as:  FLONASE PLACE 1 TO 2 SPRAYS INTO BOTH NOSTRILS DAILY AS NEEDED   hydrochlorothiazide 25 MG tablet Commonly known as:  HYDRODIURIL take 1 tablet by mouth once daily   JUICE PLUS FIBRE PO Take 4 tablets by mouth daily.   omeprazole 40 MG capsule Commonly known as:  PRILOSEC Take 1 capsule (40 mg total) by mouth daily.

## 2016-11-03 LAB — ANTI-NUCLEAR AB-TITER (ANA TITER): ANA Titer 1: 1:160 {titer} — ABNORMAL HIGH

## 2016-11-03 LAB — CYCLIC CITRUL PEPTIDE ANTIBODY, IGG: Cyclic Citrullin Peptide Ab: 20 Units — ABNORMAL HIGH

## 2016-11-03 LAB — RHEUMATOID FACTOR: Rhuematoid fact SerPl-aCnc: 19 IU/mL — ABNORMAL HIGH (ref <14)

## 2016-11-03 LAB — ANA: Anti Nuclear Antibody(ANA): POSITIVE — AB

## 2016-11-03 LAB — ANTI-DNA ANTIBODY, DOUBLE-STRANDED: ds DNA Ab: <1 IU/mL

## 2016-11-04 ENCOUNTER — Other Ambulatory Visit: Payer: Self-pay | Admitting: Family Medicine

## 2016-11-04 DIAGNOSIS — M2550 Pain in unspecified joint: Secondary | ICD-10-CM

## 2016-11-04 DIAGNOSIS — M79645 Pain in left finger(s): Secondary | ICD-10-CM

## 2016-11-04 DIAGNOSIS — M79644 Pain in right finger(s): Secondary | ICD-10-CM

## 2016-11-04 DIAGNOSIS — R768 Other specified abnormal immunological findings in serum: Secondary | ICD-10-CM

## 2016-11-04 DIAGNOSIS — R7989 Other specified abnormal findings of blood chemistry: Secondary | ICD-10-CM

## 2016-11-05 ENCOUNTER — Telehealth: Payer: Self-pay | Admitting: Family Medicine

## 2016-11-05 ENCOUNTER — Telehealth: Payer: Self-pay

## 2016-11-05 MED ORDER — PREDNISONE 20 MG PO TABS: ORAL_TABLET | ORAL | 0 refills | Status: DC

## 2016-11-05 NOTE — Telephone Encounter (Signed)
Pt called - he is needing to get a disc of his recent xrays so that he can take it to a specilaist.  He would like to pick it up today.  Thanks  cb number is [phone] Thanks

## 2016-11-05 NOTE — Telephone Encounter (Signed)
It has only been a few days and he may still get benefit from his knee injection.   Reasonable to add some oral steroids, as well - this is about as aggressive as you can be.  Call in   Prednisone 20 mg, 2 tabs po for 4 days, then 1 tab po for 4 days, #12, 0 ref.

## 2016-11-05 NOTE — Telephone Encounter (Signed)
Pt left v/m; pt was seen on 11/02/16 and was given cortisone injection; pt can see no improvement in knee pain; pt not able to work due to pain;pt request cb with what to do next. Pt request cb.

## 2016-11-05 NOTE — Telephone Encounter (Signed)
Disc made, pt called

## 2016-11-05 NOTE — Telephone Encounter (Signed)
Mr. Becherer notified as instructed by telephone.  Prednisone prescription sent into [facility] on [address] in [place] as instructed by Dr. Patsy Lager.

## 2016-11-06 ENCOUNTER — Telehealth: Payer: Self-pay | Admitting: Family Medicine

## 2016-11-06 NOTE — Telephone Encounter (Signed)
I spoke to the patient on the phone. He had good improvement in pain control after injection for the rest of the day, but then he had significant return of pain the next day.   Ongoing and limiting pain - he is currently driving to [facility] to see Dr. Mack Guise for his opinion, which I think is a good idea. I had been calling to discuss more definitive evaluation. Asked for a copy of OV notes to be sent to our office.

## 2016-11-12 ENCOUNTER — Other Ambulatory Visit: Payer: Self-pay | Admitting: Physician Assistant

## 2016-11-12 NOTE — Telephone Encounter (Signed)
Med refill approved for omeprazole.  Changed to 90 day supply with 3 refills

## 2017-02-05 ENCOUNTER — Other Ambulatory Visit: Payer: Self-pay | Admitting: Physician Assistant

## 2017-02-05 DIAGNOSIS — R768 Other specified abnormal immunological findings in serum: Secondary | ICD-10-CM | POA: Insufficient documentation

## 2017-02-05 DIAGNOSIS — Z299 Encounter for prophylactic measures, unspecified: Secondary | ICD-10-CM

## 2017-02-06 LAB — ANA W/REFLEX IF POSITIVE: Anti Nuclear Antibody(ANA): NEGATIVE

## 2017-02-06 LAB — CBC WITH DIFFERENTIAL/PLATELET
Basophils Absolute: 0 10*3/uL (ref 0.0–0.2)
Basos: 0 %
EOS (ABSOLUTE): 0.1 10*3/uL (ref 0.0–0.4)
Eos: 2 %
Hematocrit: 44.3 % (ref 37.5–51.0)
Hemoglobin: 14.5 g/dL (ref 13.0–17.7)
Immature Grans (Abs): 0 10*3/uL (ref 0.0–0.1)
Immature Granulocytes: 0 %
Lymphocytes Absolute: 2.2 10*3/uL (ref 0.7–3.1)
Lymphs: 43 %
MCH: 30.8 pg (ref 26.6–33.0)
MCHC: 32.7 g/dL (ref 31.5–35.7)
MCV: 94 fL (ref 79–97)
Monocytes Absolute: 0.4 10*3/uL (ref 0.1–0.9)
Monocytes: 7 %
Neutrophils Absolute: 2.5 10*3/uL (ref 1.4–7.0)
Neutrophils: 48 %
Platelets: 196 10*3/uL (ref 150–379)
RBC: 4.71 x10E6/uL (ref 4.14–5.80)
RDW: 12.7 % (ref 12.3–15.4)
WBC: 5.1 10*3/uL (ref 3.4–10.8)

## 2017-02-08 ENCOUNTER — Ambulatory Visit: Payer: Self-pay | Admitting: Physician Assistant

## 2017-02-08 ENCOUNTER — Encounter: Payer: Self-pay | Admitting: Physician Assistant

## 2017-02-08 VITALS — BP 149/90 | HR 61 | Temp 98.5°F | Resp 16

## 2017-02-08 DIAGNOSIS — M25841 Other specified joint disorders, right hand: Secondary | ICD-10-CM

## 2017-02-08 NOTE — Progress Notes (Signed)
S: states he has a knot on his finger, keeps bumping it on things making it hurt, no pus or drainage, was going to open it at home but his wife made him come here  O: vitals wnl, nad, skin with small clear cyst on edge of cuticle on r index finger, full rom of finger, n/v intact, cleaned with etoh, used 18g needle to open, thick clear material expelled, pt tolerated procedure well, bandaid applied  A: I and d of cyst  P: f/u as needed

## 2017-03-01 ENCOUNTER — Other Ambulatory Visit: Payer: Self-pay | Admitting: Family Medicine

## 2017-03-01 DIAGNOSIS — I10 Essential (primary) hypertension: Secondary | ICD-10-CM

## 2017-03-31 ENCOUNTER — Other Ambulatory Visit: Payer: Self-pay | Admitting: Physician Assistant

## 2017-04-02 ENCOUNTER — Other Ambulatory Visit: Payer: Self-pay | Admitting: Emergency Medicine

## 2017-04-02 NOTE — Telephone Encounter (Signed)
Med refill for prozac approved 

## 2017-07-07 ENCOUNTER — Encounter: Payer: Self-pay | Admitting: Physician Assistant

## 2017-07-07 ENCOUNTER — Ambulatory Visit: Payer: Self-pay | Admitting: Physician Assistant

## 2017-07-07 VITALS — BP 150/90 | HR 89 | Temp 97.1°F | Resp 16

## 2017-07-07 DIAGNOSIS — J0100 Acute maxillary sinusitis, unspecified: Secondary | ICD-10-CM

## 2017-07-07 MED ORDER — FLUTICASONE PROPIONATE 50 MCG/ACT NA SUSP: 2.0000 | Freq: Every day | NASAL | 6 refills | Status: DC

## 2017-07-07 MED ORDER — PREDNISONE 10 MG PO TABS: 30.0000 mg | ORAL_TABLET | Freq: Every day | ORAL | 0 refills | Status: DC

## 2017-07-07 MED ORDER — FLUOXETINE HCL 20 MG PO CAPS: 20.0000 mg | ORAL_CAPSULE | Freq: Every day | ORAL | 6 refills | Status: DC

## 2017-07-07 MED ORDER — AZITHROMYCIN 250 MG PO TABS: ORAL_TABLET | ORAL | 0 refills | Status: DC

## 2017-07-07 NOTE — Progress Notes (Signed)
S: A she states he's had cold symptoms for 2 days, sinus pain and congestion, mucus is really thick, he is worried because he was exposed to a lot of insulation in the last 2 days, also his grandchildren were visiting this weekend and they had been sick, denies fever or chills, denies chest pain or shortness of breath  O: Vitals are normal, TMs are dull, nasal mucosa is swollen and pink, throat is normal, neck is supple, no lymphadenopathy noted, lungs clear to auscultation, heart sounds are normal  A: Acute sinusitis  P: Prednisone 30 mg daily for 3 days, Flonase, Z-Pak; sent refill for Prozac to his pharmacy

## 2017-08-22 ENCOUNTER — Other Ambulatory Visit: Payer: Self-pay | Admitting: Family Medicine

## 2017-08-22 DIAGNOSIS — I10 Essential (primary) hypertension: Secondary | ICD-10-CM

## 2017-09-27 ENCOUNTER — Other Ambulatory Visit: Payer: Self-pay | Admitting: Family Medicine

## 2017-09-27 ENCOUNTER — Encounter: Payer: Self-pay | Admitting: *Deleted

## 2017-09-27 DIAGNOSIS — I10 Essential (primary) hypertension: Secondary | ICD-10-CM

## 2017-10-04 ENCOUNTER — Ambulatory Visit: Payer: Managed Care, Other (non HMO) | Admitting: Family Medicine

## 2017-10-04 ENCOUNTER — Encounter: Payer: Self-pay | Admitting: Family Medicine

## 2017-10-04 DIAGNOSIS — I10 Essential (primary) hypertension: Secondary | ICD-10-CM | POA: Diagnosis not present

## 2017-10-04 DIAGNOSIS — M25531 Pain in right wrist: Secondary | ICD-10-CM

## 2017-10-04 NOTE — Patient Instructions (Addendum)
Stop the HCTZ and see how your BP does.  If still <130/<90, then stay off the medicine.  If above 130/90, then restart 12.5mg .  Keep working on your weight and avoid salt.  Get an OTC hard wrist brace and use that.   Ice at the end of the day.  5 minutes on/off.  If not better, then let me know.   Take care.  Glad to see you.

## 2017-10-04 NOTE — Progress Notes (Signed)
He cut his HCTZ down to 12.5mg  a day.  His BP was okay on the lower dose.  He had ED on the higher dose.  D/w pt.  See AVS.    R wrist pain.  Going on for about 4 weeks.  Painful proximal to the R thumb, near the distal radius.  Pain with grip.  Rubbing it helps.  No trauma.  Some thumb pain, can radiate distally.  No falls.  No L hand pain.  He was using heat on the area w/o relief.    Meds, vitals, and allergies reviewed.   ROS: Per HPI unless specifically indicated in ROS section   GEN: nad, alert and oriented HEENT: mucous membranes moist NECK: supple w/o LA CV: rrr PULM: ctab, no inc wob EXT: no edema R wrist- not ttp at the snuff box.  Not ttp on any bony prominences.  Still able to make a fist.  Normal wrist flexion and extension.  Lourena Simmonds testing is negative on the right thumb.  Distally neurovascularly intact.  Normal grip.  Tinel testing negative at the right wrist.

## 2017-10-06 DIAGNOSIS — M25539 Pain in unspecified wrist: Secondary | ICD-10-CM | POA: Insufficient documentation

## 2017-10-06 NOTE — Assessment & Plan Note (Signed)
Discussed with patient about options.   Okay to stop the HCTZ and see how his BP does.  If still <130/<90, then stay off the medicine.  If above 130/90, then restart 12.5mg .  Keep working on your weight and avoid salt.  He agrees.

## 2017-10-06 NOTE — Assessment & Plan Note (Signed)
Frank Strickland testing is negative so I do not think he has de Quervain's tenosynovitis.  He likely has another flexor tendonitis at the wrist.  Discussed with patient about using a hard wrist brace during the day and possibly at night.  Ice, 5 minutes on and 5 minutes off, do not use heat.  Update me as needed.  He agrees.  No reason to suspect fracture.  Does not need imaging at this point.  He agrees.

## 2017-10-25 ENCOUNTER — Telehealth: Payer: Self-pay

## 2017-10-25 NOTE — Telephone Encounter (Deleted)
Not with [facility] or [facility]. Faxed back to pharmacy.

## 2017-10-25 NOTE — Telephone Encounter (Deleted)
Error. Created addendum on previous incoming call.

## 2017-10-25 NOTE — Telephone Encounter (Deleted)
Received fax for refill on fluoxetine 20mg . #30. Written by . Not with [facility], gave to [place].

## 2017-11-26 ENCOUNTER — Other Ambulatory Visit: Payer: Self-pay

## 2017-11-26 ENCOUNTER — Telehealth: Payer: Self-pay | Admitting: Family Medicine

## 2017-11-26 ENCOUNTER — Other Ambulatory Visit: Payer: Self-pay | Admitting: Family Medicine

## 2017-11-26 DIAGNOSIS — I10 Essential (primary) hypertension: Secondary | ICD-10-CM

## 2017-11-26 MED ORDER — FLUOXETINE HCL 20 MG PO CAPS: 20.0000 mg | ORAL_CAPSULE | Freq: Every day | ORAL | 3 refills | Status: DC

## 2017-11-26 MED ORDER — HYDROCHLOROTHIAZIDE 25 MG PO TABS: 12.5000 mg | ORAL_TABLET | Freq: Every day | ORAL | 0 refills | Status: DC

## 2017-11-26 NOTE — Telephone Encounter (Signed)
Prescription corrected according to last OV note and resent to pharmacy.

## 2017-11-26 NOTE — Addendum Note (Signed)
Addended by: Annamarie Major on: 11/26/2017 04:52 PM   Modules accepted: Orders

## 2017-11-26 NOTE — Telephone Encounter (Signed)
Copied from CRM [id number]. Topic: Quick Communication - Rx Refill/Question >> Nov 26, 2017  1:22 PM Alexander Bergeron B wrote: Medication: hydrochlorothiazide (HYDRODIURIL) 25 MG tablet [[id number]] Has the patient contacted their pharmacy? Yes.   (Agent: If no, request that the patient contact the pharmacy for the refill.) Preferred Pharmacy (with phone number or street name): [facility] Agent: Please be advised that RX refills may take up to 3 business days. We ask that you follow-up with your pharmacy.

## 2017-11-26 NOTE — Telephone Encounter (Signed)
Please review for dose correction- patient taking 1/2 tablet.  LOV: 10/04/17    PCP: Para March  Pharmacy: verified

## 2017-11-26 NOTE — Telephone Encounter (Signed)
Copied from CRM [id number]. Topic: Quick Communication - Rx Refill/Question >> Nov 26, 2017 11:40 AM Rudi Coco, NT wrote: Medication:FLUoxetine (PROZAC) 20 MG capsule [[id number]]  Has the patient contacted their pharmacy? yes (Agent: If no, request that the patient contact the pharmacy for the refill.) Preferred Pharmacy (with phone number or street name): [id number] - [place], [place] - [address] AT [facility] [address] [facility] [address] [place] [address] Phone: [phone] Fax: [phone]   Agent: Please be advised that RX refills may take up to 3 business days. We ask that you follow-up with your pharmacy.

## 2017-12-06 ENCOUNTER — Ambulatory Visit: Payer: Self-pay | Admitting: Family Medicine

## 2017-12-06 VITALS — BP 145/91 | HR 60 | Temp 98.3°F | Resp 16 | Ht 69.0 in | Wt 238.0 lb

## 2017-12-06 DIAGNOSIS — Z0189 Encounter for other specified special examinations: Principal | ICD-10-CM

## 2017-12-06 DIAGNOSIS — Z008 Encounter for other general examination: Secondary | ICD-10-CM

## 2017-12-06 LAB — GLUCOSE, POCT (MANUAL RESULT ENTRY): POC Glucose: 92 mg/dl (ref 70–99)

## 2017-12-06 NOTE — Progress Notes (Signed)
Subjective: Annual biometrics screening  Patient presents for his annual biometric screening. Patient reports a history of allergic rhinitis and hypertension.  Patient regularly sees a primary care provider and has an appointment on this upcoming Thursday for his annual wellness exam.  Patient reports that he generally eats a well-balanced diet but that he ate a large, salty meal yesterday for Easter.  Patient reports his blood pressures is generally well controlled. PCP: Dr. Para March. Patient works with maintenance. Patient denies any other issues or concerns.   Review of Systems Unremarkable  Objective  Physical Exam General: Awake, alert and oriented. No acute distress. Well developed, hydrated and nourished. Appears stated age.  HEENT: Supple neck without adenopathy. Sclera is non-icteric. The ear canal is clear without discharge. The tympanic membrane is normal in appearance with normal landmarks and cone of light. Nasal mucosa is pink and moist. Oral mucosa is pink and moist. The pharynx is normal in appearance without tonsillar swelling or exudates.  Skin: Skin in warm, dry and intact without rashes or lesions. Appropriate color for ethnicity. Cardiac: Heart rate and rhythm are normal. No murmurs, gallops, or rubs are auscultated.  Respiratory: The chest wall is symmetric and without deformity. No signs of respiratory distress. Lung sounds are clear in all lobes bilaterally without rales, ronchi, or wheezes.  Neurological: The patient is awake, alert and oriented to person, place, and time with normal speech.  Memory is normal and thought processes intact. No gait abnormalities are appreciated.  Psychiatric: Appropriate mood and affect.   Assessment Annual biometrics screening  Plan   Lipid panel pending. Encouraged routine visits with primary care provider.  Fasting blood sugar is 92 today. Blood pressure slightly elevated at 145/91 today.  Patient reports anxiety related to having to  go to the [facility] after this visit.  Advised patient to discuss this with his  primary care provider at his upcoming appointment this Thursday.

## 2017-12-06 NOTE — Progress Notes (Signed)
Waist 47.5

## 2017-12-07 LAB — LIPID PANEL
Chol/HDL Ratio: 4.2 ratio (ref 0.0–5.0)
Cholesterol, Total: 244 mg/dL — ABNORMAL HIGH (ref 100–199)
HDL: 58 mg/dL (ref >39)
LDL Calculated: 160 mg/dL — ABNORMAL HIGH (ref 0–99)
Triglycerides: 130 mg/dL (ref 0–149)
VLDL Cholesterol Cal: 26 mg/dL (ref 5–40)

## 2017-12-07 NOTE — Progress Notes (Signed)
Dear Frank Strickland, I wanted to let you know that your lipid panel came back.  Everything is normal with the exception of your total cholesterol and LDL cholesterol.  Your total cholesterol is 244, normal values are between 100 and 199. Your LDL cholesterol ("bad cholesterol") is 160, normal values are below 99.  I want you to follow-up with your primary care provider regarding these results.

## 2017-12-09 ENCOUNTER — Ambulatory Visit (INDEPENDENT_AMBULATORY_CARE_PROVIDER_SITE_OTHER): Payer: Managed Care, Other (non HMO) | Admitting: Family Medicine

## 2017-12-09 ENCOUNTER — Encounter: Payer: Self-pay | Admitting: Family Medicine

## 2017-12-09 VITALS — BP 142/84 | HR 65 | Temp 98.4°F | Ht 69.0 in | Wt 240.8 lb

## 2017-12-09 DIAGNOSIS — Z0000 Encounter for general adult medical examination without abnormal findings: Secondary | ICD-10-CM | POA: Diagnosis not present

## 2017-12-09 DIAGNOSIS — T7840XD Allergy, unspecified, subsequent encounter: Secondary | ICD-10-CM

## 2017-12-09 DIAGNOSIS — Z7189 Other specified counseling: Secondary | ICD-10-CM

## 2017-12-09 DIAGNOSIS — F341 Dysthymic disorder: Secondary | ICD-10-CM

## 2017-12-09 DIAGNOSIS — I10 Essential (primary) hypertension: Secondary | ICD-10-CM

## 2017-12-09 DIAGNOSIS — E785 Hyperlipidemia, unspecified: Secondary | ICD-10-CM

## 2017-12-09 MED ORDER — FLUOXETINE HCL 20 MG PO CAPS: 20.0000 mg | ORAL_CAPSULE | Freq: Every day | ORAL | 3 refills | Status: DC

## 2017-12-09 MED ORDER — HYDROCHLOROTHIAZIDE 25 MG PO TABS: 12.5000 mg | ORAL_TABLET | Freq: Every day | ORAL | 3 refills | Status: DC

## 2017-12-09 MED ORDER — PREDNISONE 20 MG PO TABS: 20.0000 mg | ORAL_TABLET | Freq: Every day | ORAL | 0 refills | Status: DC

## 2017-12-09 NOTE — Progress Notes (Signed)
CPE- See plan.  Routine anticipatory guidance given to patient.  See health maintenance.  The possibility exists that previously documented standard health maintenance information may have been brought forward from a previous encounter into this note.  If needed, that same information has been updated to reflect the current situation based on today's encounter.    Tetanus shot done 2015 Flu done 2018 at work.   PNA not due d/w pt.  Shingles not due d/w pt.  Living will d/w pt. Would have his wife designated if patient were incapacitated.  Prostate cancer screening and PSA options (with potential risks and benefits of testing vs not testing) were discussed along with recent recs/guidelines.  He declined testing PSA at this point. Colonoscopy 2014 Diet and exercise d/w pt.  Labs d/w pt.  Lipids were up, d/w pt.  HAV and HBV vaccine prev done through work.    SSRI helped with anxiety/irritability.  No ADE on med.  Compliant.  He wanted to continue.  No Si/Hi.    Hypertension:    Using medication without problems or lightheadedness: yes Chest pain with exertion:no Edema:no Short of breath:no  Some recent sinus congestion.  No fevers.  Cough. Some post nasal gtt with GI upset from that.  Rhinorrhea. Going on for about 3 days.  Using saline and flonase already.  High pollen counts recently.    His wrist pain is getting better with bracing.  No pain today.  Some occ pain but better than prev.  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    PMH and SH reviewed  Meds, vitals, and allergies reviewed.   ROS: Per HPI.  Unless specifically indicated otherwise in HPI, the patient denies:  General: fever. Eyes: acute vision changes ENT: sore throat Cardiovascular: chest pain Respiratory: SOB GI: vomiting GU: dysuria Musculoskeletal: acute back pain Derm: acute rash Neuro: acute motor dysfunction Psych: worsening mood Endocrine: polydipsia Heme: bleeding Allergy: hayfever  GEN: nad, alert  and oriented HEENT: mucous membranes moist, tm w/o erythema, nasal exam w/o erythema, clear discharge noted,  OP with cobblestoning, sinuses not ttp x4 NECK: supple w/o LA CV: rrr.   PULM: ctab, no inc wob EXT: no edema

## 2017-12-09 NOTE — Patient Instructions (Addendum)
Work on diet and exercise and recheck fasting labs in about 4 months.   Update me as needed.   Heart.org has good information on diet and exercise.   Take prednisone with food for 5 days and update me if not better.  Keep using flonase and nasal saline.  Take care.  Glad to see you.

## 2017-12-10 ENCOUNTER — Ambulatory Visit: Payer: Self-pay | Admitting: *Deleted

## 2017-12-10 DIAGNOSIS — Z7189 Other specified counseling: Secondary | ICD-10-CM | POA: Insufficient documentation

## 2017-12-10 NOTE — Assessment & Plan Note (Signed)
Tetanus shot done 2015 Flu done 2018 at work.   PNA not due d/w pt.  Shingles not due d/w pt.  Living will d/w pt. Would have his wife designated if patient were incapacitated.  Prostate cancer screening and PSA options (with potential risks and benefits of testing vs not testing) were discussed along with recent recs/guidelines.  He declined testing PSA at this point. Colonoscopy 2014 Diet and exercise d/w pt.  Labs d/w pt.  Lipids were up, d/w pt.  HAV and HBV vaccine prev done through work.

## 2017-12-10 NOTE — Assessment & Plan Note (Signed)
No change in meds.  Labs discussed with patient.  Continue work on diet and exercise.  Recheck labs later this summer.  He agrees.

## 2017-12-10 NOTE — Assessment & Plan Note (Signed)
Diet and exercise d/w pt.  Labs d/w pt.  Lipids were up, d/w pt. he will work on diet and exercise and recheck in a few months.  See after visit summary.

## 2017-12-10 NOTE — Telephone Encounter (Signed)
Called in with question regarding the prednisone.   Can he double up the dose since he still feels bad?   See triage notes below.   I instructed him to not double up and to take the prednisone every evening at the same time he started it last night which was 5:30PM.     He verbalized understanding.   I let him know to call us back if he is not improving in a couple of days after being on the medication.  Reason for Disposition . Caller has medication question only, adult not sick, and triager answers question  Answer Assessment - Initial Assessment Questions 1. SYMPTOMS: "Do you have any symptoms?"     Started taking Prednisone last night about 5:30PM.    I'm still feeling bad with this sinus infection.  I let him know to give the medication another day or two to take effect.   Also to continue using the Flonase and saline nasal spray.  2. SEVERITY: If symptoms are present, ask "Are they mild, moderate or severe?"     "I still don't feel good".  Protocols used: MEDICATION QUESTION CALL-A-AH

## 2017-12-10 NOTE — Assessment & Plan Note (Signed)
Likely seasonal/environmental allergies.  Short course of prednisone with routine cautions.  Update me as needed.  He agrees. Continue flonase.

## 2017-12-10 NOTE — Assessment & Plan Note (Signed)
Living will d/w pt.   Would have his wife designated if patient were incapacitated.  

## 2017-12-10 NOTE — Assessment & Plan Note (Signed)
SSRI helped with anxiety/irritability.  No ADE on med.  Compliant.  He wanted to continue as is, this is reasonable.  No Si/Hi.

## 2018-03-01 ENCOUNTER — Telehealth: Payer: Self-pay | Admitting: *Deleted

## 2018-03-01 NOTE — Telephone Encounter (Signed)
Copied from CRM [id number]. Topic: Quick Communication - See Telephone Encounter >> Mar 01, 2018 10:42 AM Herby Abraham C wrote: CRM for notification. See Telephone encounter for: 03/01/18.  Pt says that he has to repeat some labs and would like to have orders sent the community clinic in [place], Pt says that provider is familiar with the location. Pt would like a call when orders has been sent to them so that he can have lab completed   CB: [phone]

## 2018-03-02 NOTE — Telephone Encounter (Signed)
Orders are already in the EMR.  If he needs a letter, please sent cmet/lipid dx I10.  Thanks.

## 2018-03-02 NOTE — Telephone Encounter (Signed)
Labs faxed. Patient and Employee health aware

## 2018-03-09 ENCOUNTER — Ambulatory Visit
Admission: RE | Admit: 2018-03-09 | Discharge: 2018-03-09 | Disposition: A | Discharge status: Home / Self Care | Payer: Managed Care, Other (non HMO) | Source: Ambulatory Visit | Attending: Urology | Admitting: Urology

## 2018-03-09 ENCOUNTER — Other Ambulatory Visit: Payer: Self-pay | Admitting: Urology

## 2018-03-09 DIAGNOSIS — N281 Cyst of kidney, acquired: Secondary | ICD-10-CM | POA: Insufficient documentation

## 2018-03-09 DIAGNOSIS — R310 Gross hematuria: Secondary | ICD-10-CM | POA: Insufficient documentation

## 2018-03-09 DIAGNOSIS — N200 Calculus of kidney: Secondary | ICD-10-CM | POA: Insufficient documentation

## 2018-03-09 MED ORDER — IOPAMIDOL (ISOVUE-300) INJECTION 61%
125.0000 mL | Freq: Once | INTRAVENOUS | Status: AC | PRN
  Administered 2018-03-09: 125 mL via INTRAVENOUS

## 2018-03-23 ENCOUNTER — Other Ambulatory Visit: Payer: Self-pay

## 2018-03-23 DIAGNOSIS — I10 Essential (primary) hypertension: Secondary | ICD-10-CM

## 2018-03-24 LAB — COMPREHENSIVE METABOLIC PANEL
ALT: 15 IU/L (ref 0–44)
AST: 22 IU/L (ref 0–40)
Albumin/Globulin Ratio: 2 (ref 1.2–2.2)
Albumin: 4.1 g/dL (ref 3.5–5.5)
Alkaline Phosphatase: 57 IU/L (ref 39–117)
BUN/Creatinine Ratio: 19 (ref 9–20)
BUN: 17 mg/dL (ref 6–24)
Bilirubin Total: 0.7 mg/dL (ref 0.0–1.2)
CO2: 22 mmol/L (ref 20–29)
Calcium: 8.5 mg/dL — ABNORMAL LOW (ref 8.7–10.2)
Chloride: 103 mmol/L (ref 96–106)
Creatinine, Ser: 0.88 mg/dL (ref 0.76–1.27)
GFR calc Af Amer: 113 mL/min/{1.73_m2} (ref >59)
GFR calc non Af Amer: 98 mL/min/{1.73_m2} (ref >59)
Globulin, Total: 2.1 g/dL (ref 1.5–4.5)
Glucose: 89 mg/dL (ref 65–99)
Potassium: 4.6 mmol/L (ref 3.5–5.2)
Sodium: 139 mmol/L (ref 134–144)
Total Protein: 6.2 g/dL (ref 6.0–8.5)

## 2018-03-24 LAB — LIPID PANEL
Chol/HDL Ratio: 4.9 ratio (ref 0.0–5.0)
Cholesterol, Total: 229 mg/dL — ABNORMAL HIGH (ref 100–199)
HDL: 47 mg/dL (ref >39)
LDL Calculated: 157 mg/dL — ABNORMAL HIGH (ref 0–99)
Triglycerides: 127 mg/dL (ref 0–149)
VLDL Cholesterol Cal: 25 mg/dL (ref 5–40)

## 2018-04-04 ENCOUNTER — Encounter: Payer: Self-pay | Admitting: Family Medicine

## 2018-04-04 ENCOUNTER — Encounter (INDEPENDENT_AMBULATORY_CARE_PROVIDER_SITE_OTHER): Payer: Self-pay

## 2018-04-04 ENCOUNTER — Ambulatory Visit: Payer: Managed Care, Other (non HMO) | Admitting: Family Medicine

## 2018-04-04 DIAGNOSIS — E785 Hyperlipidemia, unspecified: Secondary | ICD-10-CM

## 2018-04-04 MED ORDER — HYDROCHLOROTHIAZIDE 25 MG PO TABS: 25.0000 mg | ORAL_TABLET | Freq: Every day | ORAL | Status: DC

## 2018-04-04 NOTE — Progress Notes (Signed)
He changed his diet, got back to exercising and quit drinking etoh.  He feels better.  I thanked him for his effort.  Weight loss noted.    No CP, SOB, BLE edema.  Lipids d/w pt.  He isn't lightheaded.    His ASCVD score is down to ~5% now with dec in BP.  D/w pt.    Meds, vitals, and allergies reviewed.   ROS: Per HPI unless specifically indicated in ROS section   GEN: nad, alert and oriented HEENT: mucous membranes moist NECK: supple w/o LA CV: rrr.  no murmur PULM: ctab, no inc wob ABD: soft, +bs EXT: no edema SKIN: no acute rash

## 2018-04-04 NOTE — Patient Instructions (Signed)
Keep working on diet and exercise.   If lightheaded at all, then cut HCTZ back to 1/2 tab a day.  Stop it if needed.   Update me about your weight and your BP and if still on HCTZ around the time of your birthday.  We'll go from there about deciding when to recheck your cholesterol.  Take care.  Glad to see you.

## 2018-04-06 NOTE — Assessment & Plan Note (Signed)
His ASCVD score is down to ~5% now with dec in BP.  D/w pt.   He'll keep working on diet and exercise.   If lightheaded at all, then cut HCTZ back to 1/2 tab a day.  Stop it if needed.   Update me about weight and your BP and if still on HCTZ around the time of his birthday.  We'll go from there about deciding when to recheck his cholesterol.  Rationale discussed with patient.  He agrees. Hold off on starting statin at this point given his significant blood pressure reduction and lower ASCVD score.

## 2018-04-27 ENCOUNTER — Encounter: Payer: Self-pay | Admitting: Family Medicine

## 2018-04-27 ENCOUNTER — Encounter: Payer: Self-pay | Admitting: *Deleted

## 2018-04-27 ENCOUNTER — Ambulatory Visit (INDEPENDENT_AMBULATORY_CARE_PROVIDER_SITE_OTHER)
Admission: RE | Admit: 2018-04-27 | Discharge: 2018-04-27 | Disposition: A | Discharge status: Home / Self Care | Payer: Managed Care, Other (non HMO) | Source: Ambulatory Visit | Attending: Family Medicine | Admitting: Family Medicine

## 2018-04-27 ENCOUNTER — Ambulatory Visit: Payer: Managed Care, Other (non HMO) | Admitting: Family Medicine

## 2018-04-27 VITALS — BP 100/72 | HR 56 | Temp 97.6°F | Ht 69.0 in | Wt 234.2 lb

## 2018-04-27 DIAGNOSIS — M7542 Impingement syndrome of left shoulder: Secondary | ICD-10-CM | POA: Diagnosis not present

## 2018-04-27 DIAGNOSIS — M25512 Pain in left shoulder: Secondary | ICD-10-CM

## 2018-04-27 MED ORDER — PREDNISONE 20 MG PO TABS: ORAL_TABLET | ORAL | 0 refills | Status: DC

## 2018-04-27 NOTE — Progress Notes (Signed)
Dr. Karleen Hampshire T. [place] Searles, MD, CAQ Sports Medicine Primary [facility] and Sports Medicine [address] [place], [address] Phone: [phone] Fax: [phone]  04/27/2018  Patient: Frank Strickland, MRN: [id number], DOB: 05/03/1966, 52 y.o.  Primary Physician:  Joaquim Nam, MD   Chief Complaint  Patient presents with  . Shoulder Pain    x 3 days   Subjective:   Frank Strickland is a 52 y.o. very pleasant male patient who presents with the following:  3 d of shoulder pain, stinging sensation. When stops it will radiate and not all the time. Has not done anything to make it worse, but he has been able to exercise.  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  He is having pain with movement of his shoulder as well as having pain in a T-shirt distribution in the left upper extremity.  He does have full range of motion at the shoulder just some pain.  Working for country, Administrator, sports, Event organiser.   Left only    Past Medical History, Surgical History, Social History, Family History, Problem List, Medications, and Allergies have been reviewed and updated if relevant.  Patient Active Problem List   Diagnosis Date Noted  . Advance care planning 12/10/2017  . Wrist pain 10/06/2017  . Vitamin D deficiency 07/02/2016  . Erectile dysfunction 07/02/2016  . GERD (gastroesophageal reflux disease) 07/02/2016  . Prostatitis 01/17/2016  . Routine general medical examination at a health care facility 08/17/2012  . HLD (hyperlipidemia) 10/12/2007  . ESSENTIAL HYPERTENSION, BENIGN 10/10/2007  . ANXIETY DEPRESSION 02/22/2007  . Allergy 02/22/2007    Past Medical History:  Diagnosis Date  . Allergy, unspecified not elsewhere classified   . Anxiety   . Duodenal ulcer    2014  . Hyperlipidemia   . Hypertension     Past Surgical History:  Procedure Laterality Date  . KIDNEY STONE  SURGERY  2002   X 2 cystoscopy with stent    Social History   Socioeconomic History  . Marital status: Married    Spouse name: Not on file  . Number of children: 3  . Years of education: Not on file  . Highest education level: Not on file  Occupational History    Employer: [employer]  Social Needs  . Financial resource strain: Not on file  . Food insecurity:    Worry: Not on file    Inability: Not on file  . Transportation needs:    Medical: Not on file    Non-medical: Not on file  Tobacco Use  . Smoking status: Former Games developer  . Smokeless tobacco: Never Used  . Tobacco comment: quit 1997  Substance and Sexual Activity  . Alcohol use: Yes    Comment: Occasional  . Drug use: No  . Sexual activity: Not on file  Lifestyle  . Physical activity:    Days per week: Not on file    Minutes per session: Not on file  . Stress: Not on file  Relationships  . Social connections:    Talks on phone: Not on file    Gets together: Not on file    Attends religious service: Not on file    Active member of club or organization: Not on file    Attends meetings of clubs or organizations: Not on file    Relationship status:  Not on file  . Intimate partner violence:    Fear of current or ex partner: Not on file    Emotionally abused: Not on file    Physically abused: Not on file    Forced sexual activity: Not on file  Other Topics Concern  . Not on file  Social History Narrative   remarried, 1991   1 child, 2 step kids   Works for [employer], Music therapist    Family History  Problem Relation Age of Onset  . Allergies Mother   . Cancer Father        lung-smoker  . Heart disease Sister   . Hypertension Sister     Allergies  Allergen Reactions  . Ace Inhibitors   . Angiotensin Receptor Blockers   . Lisinopril Other (See Comments)    Presumed cause of lip swelling  . Amlodipine Other (See Comments)    intolerant  . Codeine Sulfate     REACTION: headache  . Ketorolac  Tromethamine     REACTION: severe headache  . Tessalon [Benzonatate] Other (See Comments)    headache    Medication list reviewed and updated in full in [facility].  GEN: No fevers, chills. Nontoxic. Primarily MSK c/o today. MSK: Detailed in the HPI GI: tolerating PO intake without difficulty Neuro: No numbness, parasthesias, or tingling associated. Otherwise the pertinent positives of the ROS are noted above.   Objective:   BP 100/72   Pulse (!) 56   Temp 97.6 F (36.4 C) (Oral)   Ht 5\' 9"  (1.753 m)   Wt 234 lb 4 oz (106.3 kg)   BMI 34.59 kg/m    GEN: Well-developed,well-nourished,in no acute distress; alert,appropriate and cooperative throughout examination HEENT: Normocephalic and atraumatic without obvious abnormalities. Ears, externally no deformities PULM: Breathing comfortably in no respiratory distress EXT: No clubbing, cyanosis, or edema PSYCH: Normally interactive. Cooperative during the interview. Pleasant. Friendly and conversant. Not anxious or depressed appearing. Normal, full affect.  Shoulder: L Inspection: No muscle wasting or winging Ecchymosis/edema: neg  AC joint, scapula, clavicle: NT Cervical spine: NT, full ROM Spurling's: neg Abduction: full, 5/5 Flexion: full, 5/5 IR, full, lift-off: 5/5 ER at neutral: full, 5/5 AC crossover: neg Neer: pos Hawkins: pos Drop Test: neg Empty Can: pos Supraspinatus insertion: mild-mod T Bicipital groove: NT Speed's: neg Yergason's: neg Sulcus sign: neg Scapular dyskinesis: none C5-T1 intact  Neuro: Sensation intact Grip 5/5   Radiology: Dg Shoulder Left  Result Date: 04/27/2018 CLINICAL DATA:  52 year old male with a history of left shoulder pain with no known injury. EXAM: LEFT SHOULDER - 2+ VIEW COMPARISON:  None. FINDINGS: No acute displaced fracture. Glenohumeral joint appears congruent. Unremarkable scapular Y-view. Mild degenerative changes at the acromioclavicular joint. No evidence of  joint effusion. IMPRESSION: Negative for acute bony abnormality Electronically Signed   By: 40 D.O.   On: 04/27/2018 16:22     Assessment and Plan:   Impingement syndrome of left shoulder region  Acute pain of left shoulder - Plan: DG Shoulder Left  He shoulder pain is not unbearable, but he is already tried both some ibuprofen, Aleve, as well as some ice.  He is only been about 3 days of symptoms, so I am going to give him some oral prednisone for 10 days and have him relatively rest his shoulder and do some basic range of motion.  There is a very good chance that this alone will alleviate his symptoms.  If not, then additional therapies  or interventions such as injections or physical therapy would be very appropriate.  Follow-up: No follow-ups on file.  Meds ordered this encounter  Medications  . predniSONE (DELTASONE) 20 MG tablet    Sig: 2 tabs po for 5 days, then 1 tab po for 5 days    Dispense:  15 tablet    Refill:  0   Orders Placed This Encounter  Procedures  . DG Shoulder Left    Signed,  Karleen Hampshire T. Allisen Pidgeon, MD   Allergies as of 04/27/2018      Reactions   Ace Inhibitors    Angiotensin Receptor Blockers    Lisinopril Other (See Comments)   Presumed cause of lip swelling   Amlodipine Other (See Comments)   intolerant   Codeine Sulfate    REACTION: headache   Ketorolac Tromethamine    REACTION: severe headache   Tessalon [benzonatate] Other (See Comments)   headache      Medication List        Accurate as of 04/27/18 11:59 PM. Always use your most recent med list.          Cholecalciferol 1000 units Tbdp Take 1 tablet by mouth daily.   FLUoxetine 20 MG capsule Commonly known as:  PROZAC Take 1 capsule (20 mg total) by mouth daily.   fluticasone 50 MCG/ACT nasal spray Commonly known as:  FLONASE Place 2 sprays into both nostrils daily.   hydrochlorothiazide 25 MG tablet Commonly known as:  HYDRODIURIL Take 1 tablet (25 mg total) by  mouth daily.   multivitamin tablet Take 1 tablet by mouth daily.   predniSONE 20 MG tablet Commonly known as:  DELTASONE 2 tabs po for 5 days, then 1 tab po for 5 days

## 2018-05-16 ENCOUNTER — Other Ambulatory Visit: Payer: Self-pay | Admitting: *Deleted

## 2018-05-16 DIAGNOSIS — Z8249 Family history of ischemic heart disease and other diseases of the circulatory system: Secondary | ICD-10-CM

## 2018-05-27 ENCOUNTER — Ambulatory Visit (INDEPENDENT_AMBULATORY_CARE_PROVIDER_SITE_OTHER)
Admission: RE | Admit: 2018-05-27 | Discharge: 2018-05-27 | Disposition: A | Discharge status: Home / Self Care | Payer: Self-pay | Source: Ambulatory Visit | Attending: Cardiovascular Disease | Admitting: Cardiovascular Disease

## 2018-05-27 ENCOUNTER — Encounter (INDEPENDENT_AMBULATORY_CARE_PROVIDER_SITE_OTHER): Payer: Self-pay

## 2018-05-27 DIAGNOSIS — Z8249 Family history of ischemic heart disease and other diseases of the circulatory system: Secondary | ICD-10-CM

## 2018-05-30 ENCOUNTER — Telehealth: Payer: Self-pay | Admitting: Family Medicine

## 2018-05-30 NOTE — Telephone Encounter (Signed)
Copied from CRM [id number]. Topic: Quick Communication - See Telephone Encounter >> May 30, 2018  8:37 AM Jens Som A wrote: CRM for notification. See Telephone encounter for: 05/30/18. Patient is calling because he had CT scan and he wanted to let Dr. Para March know that the results would be sent to him.  He wanted the office to be be aware.  He is wanting to be notified of the results . Call back number [phone]

## 2018-05-31 NOTE — Telephone Encounter (Signed)
Noted.  The cardiology clinic should contact him about his calcium scoring.  He had other additional incidental findings.  Small nodules in the lower lungs bilaterally, the largest 4 mm in the right middle lobe.  These are likely benign but my understanding is the patient is a former smoker.  Given that, non-contrast chest CT could be done in 12 months to make sure these lesions have not changed.  I put a reminder in the EMR about that.  No treatment needed in the meantime otherwise.  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  Recommend annual imaging followup by CTA or MRA.  I put a reminder in the EMR about that too.  Treatment in the meantime is to make sure his blood pressure is controlled.  Thanks.

## 2018-05-31 NOTE — Telephone Encounter (Signed)
Spoke with patient and patient prefers an appointment with Dr. Para March to help him understand these issues.  Appointment scheduled.

## 2018-06-06 ENCOUNTER — Encounter: Payer: Self-pay | Admitting: Family Medicine

## 2018-06-06 ENCOUNTER — Ambulatory Visit: Payer: Managed Care, Other (non HMO) | Admitting: Family Medicine

## 2018-06-06 VITALS — BP 144/90 | HR 60 | Temp 98.2°F | Ht 69.0 in | Wt 231.2 lb

## 2018-06-06 DIAGNOSIS — I779 Disorder of arteries and arterioles, unspecified: Secondary | ICD-10-CM

## 2018-06-06 DIAGNOSIS — E785 Hyperlipidemia, unspecified: Secondary | ICD-10-CM | POA: Diagnosis not present

## 2018-06-06 DIAGNOSIS — Z23 Encounter for immunization: Secondary | ICD-10-CM

## 2018-06-06 DIAGNOSIS — R911 Solitary pulmonary nodule: Secondary | ICD-10-CM | POA: Diagnosis not present

## 2018-06-06 NOTE — Patient Instructions (Addendum)
Let me check with radiology just to make sure you don't need any extra pictures done for the nodules.   You'll likely need a follow up image for your aorta and we may be able to recheck the nodules at the same time.  Let me talk to Dr. Mariah Milling but I don't think you need to start any cholesterol medicine at this point.  Thanks for your effort.  Take care.  Glad to see you.
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
## 2018-06-07 ENCOUNTER — Telehealth: Payer: Self-pay | Admitting: *Deleted

## 2018-06-07 NOTE — Telephone Encounter (Signed)
Left voicemail message for patient to call back regarding CT results.

## 2018-06-07 NOTE — Telephone Encounter (Signed)
-----   Message from [name], MD sent at 06/04/2018  2:44 PM EDT ----- Very minimal coronary calcification noted on CT scan Score of 15, very low There is a mildly dilated ascending aorta, Noted back in 2017 on CT images, no significant change in size. Images measured 4.0, report says 4.3, normal 3 to 3.5 cm This can be periodically monitored by echo or CT every year or other year

## 2018-06-07 NOTE — Telephone Encounter (Signed)
Reviewed results and recommendations with patient and he verbalized understanding. He reports that he has improved his diet and is hoping that his cholesterol levels continue to do good. Discussed repeat testing every year or every other year and that he can review this with his PCP to get that scheduled. He verbalized understanding to discuss this with PCP for further monitoring. He had no further questions at this time.

## 2018-06-12 DIAGNOSIS — I779 Disorder of arteries and arterioles, unspecified: Secondary | ICD-10-CM | POA: Insufficient documentation

## 2018-06-12 NOTE — Assessment & Plan Note (Signed)
This could be followed with imaging periodically.  Discussed with patient.  Continue work on diet and exercise.

## 2018-06-12 NOTE — Assessment & Plan Note (Signed)
I held off on statin start at this point.  I want to talk to Dr. Mariah Milling and we will go from there.  Discussed with patient.  He agrees.

## 2018-06-12 NOTE — Assessment & Plan Note (Signed)
I can check on follow-up about this.  Discussed with patient.

## 2018-06-30 ENCOUNTER — Encounter: Payer: Self-pay | Admitting: *Deleted

## 2018-06-30 ENCOUNTER — Ambulatory Visit: Payer: Self-pay | Admitting: Emergency Medicine

## 2018-06-30 ENCOUNTER — Telehealth: Payer: Self-pay | Admitting: Family Medicine

## 2018-06-30 ENCOUNTER — Other Ambulatory Visit: Payer: Self-pay | Admitting: *Deleted

## 2018-06-30 ENCOUNTER — Telehealth: Payer: Self-pay | Admitting: *Deleted

## 2018-06-30 VITALS — BP 140/90 | HR 52 | Temp 98.4°F | Resp 14

## 2018-06-30 DIAGNOSIS — R05 Cough: Secondary | ICD-10-CM

## 2018-06-30 DIAGNOSIS — E785 Hyperlipidemia, unspecified: Secondary | ICD-10-CM

## 2018-06-30 DIAGNOSIS — R059 Cough, unspecified: Secondary | ICD-10-CM

## 2018-06-30 MED ORDER — ROSUVASTATIN CALCIUM 10 MG PO TABS: 10.0000 mg | ORAL_TABLET | Freq: Every day | ORAL | 3 refills | Status: DC

## 2018-06-30 MED ORDER — IPRATROPIUM-ALBUTEROL 0.5-2.5 (3) MG/3ML IN SOLN: 3.0000 mL | Freq: Four times a day (QID) | RESPIRATORY_TRACT | Status: DC

## 2018-06-30 MED ORDER — ALBUTEROL SULFATE HFA 108 (90 BASE) MCG/ACT IN AERS: 2.0000 | INHALATION_SPRAY | RESPIRATORY_TRACT | 1 refills | Status: DC | PRN

## 2018-06-30 MED ORDER — IPRATROPIUM-ALBUTEROL 0.5-2.5 (3) MG/3ML IN SOLN
3.0000 mL | Freq: Once | RESPIRATORY_TRACT | Status: AC
  Administered 2018-06-30: 3 mL via RESPIRATORY_TRACT

## 2018-06-30 NOTE — Patient Instructions (Signed)
  Take over-the-counter Mucinex twice a day. Drink good quantities of fluids. Use your inhaler to help you cough up phlegm. See your PCP tomorrow. Continue Flonase daily. Continue saline nasal sprays.      Cough, Adult Coughing is a reflex that clears your throat and your airways. Coughing helps to heal and protect your lungs. It is normal to cough occasionally, but a cough that happens with other symptoms or lasts a long time may be a sign of a condition that needs treatment. A cough may last only 2-3 weeks (acute), or it may last longer than 8 weeks (chronic). What are the causes? Coughing is commonly caused by:  Breathing in substances that irritate your lungs.  A viral or bacterial respiratory infection.  Allergies.  Asthma.  Postnasal drip.  Smoking.  Acid backing up from the stomach into the esophagus (gastroesophageal reflux).  Certain medicines.  Chronic lung problems, including COPD (or rarely, lung cancer).  Other medical conditions such as heart failure.  Follow these instructions at home: Pay attention to any changes in your symptoms. Take these actions to help with your discomfort:  Take medicines only as told by your health care provider. ? If you were prescribed an antibiotic medicine, take it as told by your health care provider. Do not stop taking the antibiotic even if you start to feel better. ? Talk with your health care provider before you take a cough suppressant medicine.  Drink enough fluid to keep your urine clear or pale yellow.  If the air is dry, use a cold steam vaporizer or humidifier in your bedroom or your home to help loosen secretions.  Avoid anything that causes you to cough at work or at home.  If your cough is worse at night, try sleeping in a semi-upright position.  Avoid cigarette smoke. If you smoke, quit smoking. If you need help quitting, ask your health care provider.  Avoid caffeine.  Avoid alcohol.  Rest as  needed.  Contact a health care provider if:  You have new symptoms.  You cough up pus.  Your cough does not get better after 2-3 weeks, or your cough gets worse.  You cannot control your cough with suppressant medicines and you are losing sleep.  You develop pain that is getting worse or pain that is not controlled with pain medicines.  You have a fever.  You have unexplained weight loss.  You have night sweats. Get help right away if:  You cough up blood.  You have difficulty breathing.  Your heartbeat is very fast. This information is not intended to replace advice given to you by your health care provider. Make sure you discuss any questions you have with your health care provider. Document Released: 01/30/2011 Document Revised: 01/09/2016 Document Reviewed: 10/10/2014 Elsevier Interactive Patient Education  [employer].

## 2018-06-30 NOTE — Telephone Encounter (Addendum)
Patient advised.  Patient is agreeable with starting Crestor.  Rx sent to pharmacy.  Patient is coming in for OV tomorrow for a sinus infection.  Lab appointment will be made and given to him at that time. Please order future labs.

## 2018-06-30 NOTE — Telephone Encounter (Signed)
I saw the note in the EMR from Dr. Cleta Alberts.  I appreciate his input.  I routed this over to him to close the loop on the message.  I will see patient tomorrow.   I thank all involved.

## 2018-06-30 NOTE — Progress Notes (Addendum)
Subjective. Patient in his usual state of health until 3 days ago when he developed nasal congestion, postnasal drip, and a cough.  His cough has been nonproductive but he feels tightness in his chest and feels as though he has mucus in his chest.  He has not been short of breath.  He has been using a Flonase inhaler.  He has an appointment with his doctor tomorrow to discuss results of a recent coronary artery screening CT which did show mild dilatation of the ascending aorta of 4.3 cm.  .  He stopped smoking years ago but has used a nebulizer in the past but not recently his wife has a history of reactive airways disease.. Review of systems. Recent scan was doneTo obtain a coronary calcium score.  His score was 15 which put him at the 63rd percentile for age.  He also had a dilated aortic root of 4.3 cm.  Patient has an appointment tomorrow to see his primary care physician Dr. Para March. Objective. Alert and cooperative not in any distress. TMs clear. Posterior pharynx normal. Neck supple. Chest exam was clear.  No wheezes were heard. Heart regular rate and rhythm. EKG sinus bradycardia heart rate of 56 no acute changes seen: no ST-T changes. Assessement Unclear whether this started as allergy symptoms or an upper respiratory infection.  He works around [employer] which also could be a contributing factor.  He already uses Flonase.  He did improve somewhat after a nebulizer treatment with the ability to cough up some clear thick phlegm.  Because of this we will go ahead and add an inhaler to his treatment plan I did review with him what they saw on his CT scan.  He does have pulmonary nodules which appear to be stable.  Heart rate stayed 52-56.. Plan. Follow-up Dr. Para March tomorrow. Albuterol inhaler to be used every 4-6 hours to help him cough up the phlegm in his chest. Mucinex twice a day to be taken with good quantities of water. I called Dr. Lianne Bushy office to discuss the case with him but had to  leave a message on the nurse line.  I left my cell phone number.

## 2018-06-30 NOTE — Telephone Encounter (Signed)
Call patient.  I am sorry there was a delay in getting back to him.  I did check with radiology.  I pasted the radiology results below.  Good news, the pulmonary nodules are stable.  No follow-up as needed for this.  The situation about his cholesterol is more complicated.  He has worked hard on diet and exercise and he deserves a lot of credit for that.  I talked with Dr. Mariah Milling about his situation. Given his percentile scoring on the cardiac calcium CT, I think I would start Crestor 10 mg daily  Ideally goal LDL should be at least 100 or less if not 70   let me know if he is willing to give this a try and I can send in the prescription.  We would then recheck his labs 2 months after he started the medication. If he had aches on the medication then stop it in the meantime and let me know.  Thanks.   =================================== Comparison of the pulmonary nodules was made to prior study from 09/10/2016. These are stable and were described as stable on the prior report when compared to more remote imaging. Therefore, these are compatible with benign nodules and no additional follow-up is necessary.

## 2018-06-30 NOTE — Telephone Encounter (Signed)
Dr Lesle Chris left message on triage vm regarding listed pt. No additional information left regarding pt.

## 2018-06-30 NOTE — Addendum Note (Signed)
Addended by: Karen Kays on: 06/30/2018 03:38 PM   Modules accepted: Orders

## 2018-07-01 ENCOUNTER — Other Ambulatory Visit: Payer: Self-pay

## 2018-07-01 ENCOUNTER — Ambulatory Visit: Payer: Self-pay | Admitting: Family Medicine

## 2018-07-01 NOTE — Telephone Encounter (Signed)
Patient notified as instructed by telephone and verbalized understanding. Patient cancelled his appointment today and was seen at a clinic thru is work. Lab appointment scheduled as instructed.

## 2018-07-01 NOTE — Telephone Encounter (Addendum)
Pt said at 06/06/18 appt pts BP was up and pt said Dr Para March increased the HCTZ 25 mg from 1/2 tab to 1 tab daily. Pt said [facility] [address] still has 1/2 tab daily. Pt request new rx HCTZ 25 mg taking 1 daily to [facility] [address]. Do not see in 06/06/18 note that HCTZ was increased but on med list taking one daily. Please advise. Pt will ck with pharmacy later today for new rx. Please advise. Also pt having nurse at work ck BP and pt could not remember what BP was but pt said the nurse always says the BP is good. Pt seen for sinus infection on 07/01/18 and BP was 140/90.

## 2018-07-01 NOTE — Telephone Encounter (Signed)
Ordered. Thanks

## 2018-07-01 NOTE — Addendum Note (Signed)
Addended by: Joaquim Nam on: 07/01/2018 05:54 AM   Modules accepted: Orders

## 2018-07-03 MED ORDER — HYDROCHLOROTHIAZIDE 25 MG PO TABS: 25.0000 mg | ORAL_TABLET | Freq: Every day | ORAL | 3 refills | Status: DC

## 2018-07-03 NOTE — Telephone Encounter (Signed)
Sent. Thanks.   

## 2018-07-04 ENCOUNTER — Encounter: Payer: Self-pay | Admitting: Family Medicine

## 2018-07-04 ENCOUNTER — Ambulatory Visit: Payer: Managed Care, Other (non HMO) | Admitting: Family Medicine

## 2018-07-04 VITALS — BP 112/78 | HR 57 | Temp 98.4°F | Ht 69.0 in | Wt 230.2 lb

## 2018-07-04 DIAGNOSIS — J22 Unspecified acute lower respiratory infection: Secondary | ICD-10-CM | POA: Diagnosis not present

## 2018-07-04 MED ORDER — HYDROCODONE-HOMATROPINE 5-1.5 MG/5ML PO SYRP: 5.0000 mL | ORAL_SOLUTION | Freq: Two times a day (BID) | ORAL | 0 refills | Status: DC | PRN

## 2018-07-04 MED ORDER — AZITHROMYCIN 250 MG PO TABS: ORAL_TABLET | ORAL | 0 refills | Status: DC

## 2018-07-04 NOTE — Patient Instructions (Signed)
You likely have a bronchitis, may be viral.  Viral infections usually take 7-10 days to resolve.  The cough can last a few weeks to go away. Use medication as prescribed: hydrocodone cough syrup for night time (sedation precautions).  Take ibuprofen 600mg  with meals for next few days for lung inflammation.  Push fluids and plenty of rest. May continue mucinex.  zpack antibiotic prescription provided - fill if fever >101 or worsening productive cough or not improving over 7-10 days.  Call clinic with questions.  Good to see you today. I hope you start feeling better soon.

## 2018-07-04 NOTE — Progress Notes (Signed)
BP 112/78 (BP Location: Left Arm, Patient Position: Sitting, Cuff Size: Large)   Pulse (!) 57   Temp 98.4 F (36.9 C) (Oral)   Ht 5\' 9"  (1.753 m)   Wt 230 lb 4 oz (104.4 kg)   SpO2 97%   BMI 34.00 kg/m    CC: sinus problem Subjective:    Patient ID: , male    DOB: August 22, 1965, 52 y.o.   MRN: [id number]  HPI: Frank Strickland is a 52 y.o. male presenting on 07/04/2018 for Sinus Problem (C/o sinus congestion, drainage, cough up dark mucous- worse at night, bilateral ear popping, chills, chest congestion. Sxs on 06/28/18 seen on 06/30/18. Tried Mucinex, helpful. Pt accompanied by his wife, Frank Strickland. )   1 wk h/o feeling ill. Significant nasal congestion, tooth pain, chest congestion, productive cough of thick mucous. Se[facility] by [facility] Dr AVITA ONTARIO rec OTC mucinex, added albuterol inhaler (with benefit). Persistent chest congestion. Coughing fits worse at night time. Chest heaviness from cough and congestion. He does feel better during the day but am and night time symptoms are the worst. Chills at night, feverish. Ears feel muffled, HA treating with tylenol, ST, PNdrainage.   No dyspnea or wheezing.  No sick contacts at home.  No smokers at home.  No h/o asthma.  Cardiology following dilated aortic root.   Relevant past medical, surgical, family and social history reviewed and updated as indicated. Interim medical history since our last visit reviewed. Allergies and medications reviewed and updated. Outpatient Medications Prior to Visit  Medication Sig Dispense Refill  . albuterol (PROVENTIL HFA;VENTOLIN HFA) 108 (90 Base) MCG/ACT inhaler Inhale 2 puffs into the lungs every 4 (four) hours as needed for wheezing or shortness of breath (cough, shortness of breath or wheezing.). 1 Inhaler 1  . Cholecalciferol 1000 units TBDP Take 1 tablet by mouth daily.    Cleta Alberts FLUoxetine (PROZAC) 20 MG capsule Take 1 capsule (20 mg total) by mouth daily. 90 capsule 3  . fluticasone (FLONASE) 50 MCG/ACT  nasal spray Place 2 sprays into both nostrils daily. 16 g 6  . hydrochlorothiazide (HYDRODIURIL) 25 MG tablet Take 1 tablet (25 mg total) by mouth daily. 90 tablet 3  . Multiple Vitamin (MULTIVITAMIN) tablet Take 1 tablet by mouth daily.    . rosuvastatin (CRESTOR) 10 MG tablet Take 1 tablet (10 mg total) by mouth daily. 30 tablet 3   No facility-administered medications prior to visit.      Per HPI unless specifically indicated in ROS section below Review of Systems     Objective:    BP 112/78 (BP Location: Left Arm, Patient Position: Sitting, Cuff Size: Large)   Pulse (!) 57   Temp 98.4 F (36.9 C) (Oral)   Ht 5\' 9"  (1.753 m)   Wt 230 lb 4 oz (104.4 kg)   SpO2 97%   BMI 34.00 kg/m   Wt Readings from Last 3 Encounters:  07/04/18 230 lb 4 oz (104.4 kg)  06/06/18 231 lb 4 oz (104.9 kg)  04/27/18 234 lb 4 oz (106.3 kg)    Physical Exam  Constitutional: He appears well-developed and well-nourished. No distress.  HENT:  Head: Normocephalic and atraumatic.  Right Ear: Hearing, tympanic membrane, external ear and ear canal normal.  Left Ear: Hearing, tympanic membrane, external ear and ear canal normal.  Nose: Mucosal edema present. No rhinorrhea. Right sinus exhibits maxillary sinus tenderness and frontal sinus tenderness. Left sinus exhibits maxillary sinus tenderness and frontal sinus tenderness.  Mouth/Throat:  Uvula is midline, oropharynx is clear and moist and mucous membranes are normal. No oropharyngeal exudate, posterior oropharyngeal edema, posterior oropharyngeal erythema or tonsillar abscesses.  Eyes: Pupils are equal, round, and reactive to light. Conjunctivae and EOM are normal. No scleral icterus.  Neck: Normal range of motion. Neck supple.  Cardiovascular: Regular rhythm, normal heart sounds and intact distal pulses. Bradycardia present.  No murmur heard. Pulmonary/Chest: Effort normal and breath sounds normal. No respiratory distress. He has no wheezes. He has no  rales.  Lymphadenopathy:    He has no cervical adenopathy.  Skin: Skin is warm and dry. No rash noted.  Nursing note and vitals reviewed.     Assessment & Plan:   Problem List Items Addressed This Visit    Acute respiratory infection - Primary    Anticipate developing acute bronchitis. Discussed likely viral, supportive care reviewed. Rx hycodan cough syrup for night time, rec ibuprofen 600mg  with meals. WASP for zpack provided with indications when to fill. Pt and wife agree with plan.       Relevant Medications   azithromycin (ZITHROMAX) 250 MG tablet       Meds ordered this encounter  Medications  . azithromycin (ZITHROMAX) 250 MG tablet    Sig: Take two tablets on day one followed by one tablet on days 2-5    Dispense:  6 each    Refill:  0  . HYDROcodone-homatropine (HYCODAN) 5-1.5 MG/5ML syrup    Sig: Take 5 mLs by mouth 2 (two) times daily as needed for cough (sedation precautions).    Dispense:  140 mL    Refill:  0   No orders of the defined types were placed in this encounter.   Follow up plan: No follow-ups on file.  , MD

## 2018-07-04 NOTE — Assessment & Plan Note (Signed)
Anticipate developing acute bronchitis. Discussed likely viral, supportive care reviewed. Rx hycodan cough syrup for night time, rec ibuprofen 600mg  with meals. WASP for zpack provided with indications when to fill. Pt and wife agree with plan.

## 2018-08-01 DIAGNOSIS — I2510 Atherosclerotic heart disease of native coronary artery without angina pectoris: Secondary | ICD-10-CM | POA: Insufficient documentation

## 2018-08-01 DIAGNOSIS — I77810 Thoracic aortic ectasia: Secondary | ICD-10-CM | POA: Insufficient documentation

## 2018-08-01 NOTE — Progress Notes (Signed)
Cardiology Office Note  Date:  08/02/2018   ID:  Frank Strickland, DOB 19-Dec-1965, MRN [id number]  PCP:  Joaquim Nam, MD   Chief Complaint  Patient presents with  . New Patient (Initial Visit)    dilated Aorta? Patients chest discomfort from ulcer.Medications reviewed verbally.     HPI:  [name] is a 52 year old gentleman with past medical history of ETOH abuse HTN Hyperlipidemia Dilated ascending aorta Family hx of premature CAD Who presents by referral from Dr. Para March for consultation of his dilated aorta, coronary disease  Ct scan 05/27/18, images pulled up in the office and reviewed with him in detail Very minimal coronary calcification noted on CT scan Score of 15,   mildly dilated ascending aorta, Noted back in 2017 even back to 2010 on CT images, no significant change in size. All of these other studies were pulled up in the office and discussed with him Most recent images measured 4.0  He reports having GI ulcer in the past, 4 to 5 years ago Currently with heartburn symptoms if he eats the wrong foods Had cramps on omeprazole Takes tums for GERD  Active at baseline, does contractor work Denies shortness of breath or chest pain on exertion  Denies active smoking history, no diabetes Cholesterol 220s up to 130s LDL 150s  EKG personally reviewed by myself on todays visit Shows sinus bradycardia rate 56 bpm no significant ST or T wave changes   PMH:   has a past medical history of Allergy, unspecified not elsewhere classified, Anxiety, Duodenal ulcer, Hyperlipidemia, and Hypertension.  PSH:    Past Surgical History:  Procedure Laterality Date  . KIDNEY STONE SURGERY  2002   X 2 cystoscopy with stent    Current Outpatient Medications  Medication Sig Dispense Refill  . Cholecalciferol 1000 units TBDP Take 1 tablet by mouth daily.    Marland Kitchen FLUoxetine (PROZAC) 20 MG capsule Take 1 capsule (20 mg total) by mouth daily. 90 capsule 3  . fluticasone  (FLONASE) 50 MCG/ACT nasal spray Place 2 sprays into both nostrils daily. 16 g 6  . hydrochlorothiazide (HYDRODIURIL) 25 MG tablet Take 1 tablet (25 mg total) by mouth daily. 90 tablet 3  . Multiple Vitamin (MULTIVITAMIN) tablet Take 1 tablet by mouth daily.    . rosuvastatin (CRESTOR) 10 MG tablet Take 1 tablet (10 mg total) by mouth daily. 30 tablet 3   No current facility-administered medications for this visit.      Allergies:   Ace inhibitors; Angiotensin receptor blockers; Lisinopril; Amlodipine; Codeine sulfate; Ketorolac tromethamine; and Tessalon [benzonatate]   Social History:  The patient  reports that he has quit smoking. He has never used smokeless tobacco. He reports current alcohol use. He reports that he does not use drugs.   Family History:   family history includes Allergies in his mother; Cancer in his father; Heart disease in his sister; Hypertension in his sister.    Review of Systems: Review of Systems  Constitutional: Negative.   Respiratory: Negative.   Cardiovascular: Negative.   Gastrointestinal: Negative.   Musculoskeletal: Negative.   Neurological: Negative.   Psychiatric/Behavioral: Negative.   All other systems reviewed and are negative.    PHYSICAL EXAM: VS:  BP 124/78 (BP Location: Right Arm, Patient Position: Sitting, Cuff Size: Normal)   Pulse (!) 56   Ht 5\' 7"  (1.702 m)   Wt 233 lb (105.7 kg)   BMI 36.49 kg/m  , BMI Body mass index is 36.49 kg/m. GEN:  Well nourished, well developed, in no acute distress  HEENT: normal  Neck: no JVD, carotid bruits, or masses Cardiac: RRR; no murmurs, rubs, or gallops,no edema  Respiratory:  clear to auscultation bilaterally, normal work of breathing GI: soft, nontender, nondistended, + BS MS: no deformity or atrophy  Skin: warm and dry, no rash Neuro:  Strength and sensation are intact Psych: euthymic mood, full affect  Recent Labs: 03/23/2018: ALT 15; BUN 17; Creatinine, Ser 0.88; Potassium 4.6;  Sodium 139    Lipid Panel Lab Results  Component Value Date   CHOL 229 (H) 03/23/2018   HDL 47 03/23/2018   LDLCALC 157 (H) 03/23/2018   TRIG 127 03/23/2018      Wt Readings from Last 3 Encounters:  08/02/18 233 lb (105.7 kg)  07/04/18 230 lb 4 oz (104.4 kg)  06/06/18 231 lb 4 oz (104.9 kg)       ASSESSMENT AND PLAN:  Ascending aorta dilation (HCC) - Plan: EKG 12-Lead No significant change in size of aorta compared to 2010 by CT scan  CT scan from 2010, 2014, 2017 and 2019 pulled up in the office Aorta appears relatively same size roughly 4 cm Given this does not need regular screening, possibly could be done every several years, even by echocardiogram  hyperlipidemia, unspecified hyperlipidemia type Long discussion with him, recommended Crestor 10 daily Ideally goal LDL less than 70 If well above goal could start Zetia 10 mg daily  Coronary artery calcification seen on CAT scan - Plan: EKG 12-Lead Very mild coronary calcification noted in the LAD Images pulled up and discussed with him Would recommend aggressive cholesterol management, lifestyle modification  Disposition:   F/U as needed  Patient was seen in consultation for Dr. Para March and will be referred back to his office for ongoing care of the issues detailed above   Total encounter time more than 60 minutes  Greater than 50% was spent in counseling and coordination of care with the patient    Orders Placed This Encounter  Procedures  . EKG 12-Lead     Signed, Dossie Arbour, M.D., Ph.D. 08/02/2018  [facility] [place], [place] [phone]

## 2018-08-02 ENCOUNTER — Ambulatory Visit: Payer: Managed Care, Other (non HMO) | Admitting: Cardiovascular Disease

## 2018-08-02 ENCOUNTER — Encounter

## 2018-08-02 ENCOUNTER — Encounter: Payer: Self-pay | Admitting: Cardiovascular Disease

## 2018-08-02 VITALS — BP 124/78 | HR 56 | Ht 67.0 in | Wt 233.0 lb

## 2018-08-02 DIAGNOSIS — I2510 Atherosclerotic heart disease of native coronary artery without angina pectoris: Secondary | ICD-10-CM

## 2018-08-02 DIAGNOSIS — I77810 Thoracic aortic ectasia: Secondary | ICD-10-CM

## 2018-08-02 DIAGNOSIS — E785 Hyperlipidemia, unspecified: Secondary | ICD-10-CM

## 2018-08-02 NOTE — Patient Instructions (Signed)

## 2018-08-30 ENCOUNTER — Other Ambulatory Visit (INDEPENDENT_AMBULATORY_CARE_PROVIDER_SITE_OTHER): Payer: Managed Care, Other (non HMO)

## 2018-08-30 DIAGNOSIS — E785 Hyperlipidemia, unspecified: Secondary | ICD-10-CM | POA: Diagnosis not present

## 2018-08-30 LAB — COMPREHENSIVE METABOLIC PANEL
ALT: 16 U/L (ref 0–53)
AST: 25 U/L (ref 0–37)
Albumin: 4 g/dL (ref 3.5–5.2)
Alkaline Phosphatase: 51 U/L (ref 39–117)
BUN: 17 mg/dL (ref 6–23)
CO2: 30 mEq/L (ref 19–32)
Calcium: 8.9 mg/dL (ref 8.4–10.5)
Chloride: 102 mEq/L (ref 96–112)
Creatinine, Ser: 0.79 mg/dL (ref 0.40–1.50)
GFR: 108.61 mL/min (ref >60.00)
Glucose, Bld: 94 mg/dL (ref 70–99)
Potassium: 4.3 mEq/L (ref 3.5–5.1)
Sodium: 138 mEq/L (ref 135–145)
Total Bilirubin: 1.1 mg/dL (ref 0.2–1.2)
Total Protein: 6.4 g/dL (ref 6.0–8.3)

## 2018-08-30 LAB — LIPID PANEL
Cholesterol: 234 mg/dL — ABNORMAL HIGH (ref 0–200)
HDL: 54.3 mg/dL (ref >39.00)
LDL Cholesterol: 154 mg/dL — ABNORMAL HIGH (ref 0–99)
NonHDL: 180.16
Total CHOL/HDL Ratio: 4
Triglycerides: 132 mg/dL (ref 0.0–149.0)
VLDL: 26.4 mg/dL (ref 0.0–40.0)

## 2018-09-01 ENCOUNTER — Other Ambulatory Visit: Payer: Self-pay

## 2018-09-02 ENCOUNTER — Encounter: Payer: Self-pay | Admitting: Family Medicine

## 2018-09-02 ENCOUNTER — Ambulatory Visit: Payer: Managed Care, Other (non HMO) | Admitting: Family Medicine

## 2018-09-02 VITALS — BP 146/84 | HR 61 | Temp 98.4°F | Ht 67.0 in | Wt 234.0 lb

## 2018-09-02 DIAGNOSIS — E785 Hyperlipidemia, unspecified: Secondary | ICD-10-CM | POA: Diagnosis not present

## 2018-09-02 DIAGNOSIS — R1084 Generalized abdominal pain: Secondary | ICD-10-CM

## 2018-09-02 DIAGNOSIS — N410 Acute prostatitis: Secondary | ICD-10-CM

## 2018-09-02 DIAGNOSIS — R300 Dysuria: Secondary | ICD-10-CM

## 2018-09-02 LAB — POC URINALSYSI DIPSTICK (AUTOMATED)
Bilirubin, UA: NEGATIVE
Blood, UA: NEGATIVE
Glucose, UA: NEGATIVE
Ketones, UA: NEGATIVE
Leukocytes, UA: NEGATIVE
Nitrite, UA: NEGATIVE
Protein, UA: NEGATIVE
Spec Grav, UA: 1.015 (ref 1.010–1.025)
Urobilinogen, UA: 0.2 E.U./dL
pH, UA: 7 (ref 5.0–8.0)

## 2018-09-02 MED ORDER — FAMOTIDINE 20 MG PO TABS: 20.0000 mg | ORAL_TABLET | Freq: Every day | ORAL | Status: DC | PRN

## 2018-09-02 MED ORDER — SULFAMETHOXAZOLE-TRIMETHOPRIM 800-160 MG PO TABS: 1.0000 | ORAL_TABLET | Freq: Two times a day (BID) | ORAL | 0 refills | Status: DC

## 2018-09-02 MED ORDER — FAMOTIDINE 20 MG PO TABS: 10.0000 mg | ORAL_TABLET | Freq: Every day | ORAL | Status: DC | PRN

## 2018-09-02 NOTE — Patient Instructions (Signed)
Stay off the crestor for now.  Take septra twice a day for 1 month.  If you are getting worse in the meantime, then let me know.  We'll contact you with your lab report.  We'll see about cholesterol medicine options when you are done with septra.  I'll update cardiology in the meantime.   Take care.  Glad to see you.

## 2018-09-02 NOTE — Progress Notes (Signed)
He is off statin, he had aches on the med.  Aches better off med.  He clearly felt worse when he was taking Crestor, better off the medication.  Burning with urination.  Lower abd pain.  Lower back pain, better with urination.  This doesn't feel like prev renal stones.   Going on for about 10 days.  Urgency.  No discharge.  Possible fever at night.  No diarrhea.  No blood in stool.    This doesn't feel like prev diverticulitis.    Meds, vitals, and allergies reviewed.   ROS: Per HPI unless specifically indicated in ROS section   nad ncat Neck supple, no LA rrr ctab abd soft.  Normal bowel sounds Lower abd ttp w/o rebound.  Prostate ttp Extremities without edema. Skin well perfused.

## 2018-09-03 LAB — URINE CULTURE
MICRO NUMBER:: 71185
Result:: NO GROWTH
SPECIMEN QUALITY:: ADEQUATE

## 2018-09-04 NOTE — Assessment & Plan Note (Signed)
He clearly felt worse on Crestor, with resolution of muscle aches after stopping medication.  I will route this to cardiology in the meantime.  I did not really want to make any other changes in his cholesterol regimen at this point given the acute issues he is having right now.  Patient agrees.  I appreciate the help of all involved.  Recent labs discussed with patient.  I plan on checking with the patient in about 1 month to consider other options about his cholesterol, such as a low dose of intermittent pravastatin.

## 2018-09-04 NOTE — Assessment & Plan Note (Signed)
Consistent with acute prostatitis.  Discussed with patient.  History of similar in the past, per his report. Take septra twice a day for 1 month.  If getting worse in the meantime, then he'll let me know.  Check urine culture in the meantime, though it may be negative. He understood the rationale for treatment, antibiotic choice, duration of treatment.  Okay for outpatient follow-up.  All questions answered to the best my ability.

## 2018-09-06 ENCOUNTER — Encounter: Payer: Self-pay | Admitting: Family Medicine

## 2018-09-07 ENCOUNTER — Other Ambulatory Visit: Payer: Self-pay | Admitting: Family Medicine

## 2018-09-07 MED ORDER — TRAMADOL HCL 50 MG PO TABS: 50.0000 mg | ORAL_TABLET | Freq: Three times a day (TID) | ORAL | 0 refills | Status: DC | PRN

## 2018-11-02 ENCOUNTER — Telehealth: Payer: Self-pay | Admitting: Family Medicine

## 2018-11-02 NOTE — Telephone Encounter (Signed)
Please check with patient.  I realize he was intolerant of Crestor. I want to check back with him after he had been off of the Crestor for period of time. He may be less likely to have aches with pravastatin.  See if he is willing to give a low dose of that a try.  Let me know.  Thanks.

## 2018-11-03 NOTE — Telephone Encounter (Signed)
Spoke to patient and he says he feels fine now, off the Crestor.  Patient is willing to try Pravastatin at a low dose.

## 2018-11-04 MED ORDER — PRAVASTATIN SODIUM 10 MG PO TABS: 10.0000 mg | ORAL_TABLET | Freq: Every day | ORAL | 1 refills | Status: DC

## 2018-11-04 NOTE — Telephone Encounter (Signed)
Start pravastatin 10mg  once a week.  See if he can tolerate.  If he can, then gradually work up to 2-3 doses per week with eventual goal of daily use.  See what he can tolerate in the next few months and then update [place].  rx sent.  Thanks.

## 2018-11-04 NOTE — Telephone Encounter (Signed)
Left detailed message on voicemail.  

## 2018-11-04 NOTE — Addendum Note (Signed)
Addended by: Joaquim Nam on: 11/04/2018 06:35 AM   Modules accepted: Orders

## 2018-12-22 ENCOUNTER — Other Ambulatory Visit: Payer: Self-pay | Admitting: Family Medicine

## 2018-12-22 NOTE — Telephone Encounter (Signed)
Please check to see if patient could tolerate pravastatin.  We may need to set f/u labs for that.  Fluoxetine sent, reasonable to continue for now.  Thanks.

## 2018-12-22 NOTE — Telephone Encounter (Signed)
Last office visit 09/02/2018 for acute prostatitis.  Last CPE 12/09/2017.  No future appointments. Last refilled 12/09/2017 for #90 with 3 refills.  Refill?

## 2018-12-23 NOTE — Telephone Encounter (Signed)
Left message for patient to call back  

## 2018-12-26 NOTE — Telephone Encounter (Signed)
Voicemail is full.  Contacted wife at home number (DPR) and she states he was not able to tolerate the Pravastatin.  If patient needs an appointment, please advise.

## 2018-12-27 NOTE — Telephone Encounter (Signed)
Spoke to pt. He said he will not take another statin! He is feeling much better off it. He said he is done with cholesterol meds. Watching his diet.

## 2018-12-27 NOTE — Telephone Encounter (Signed)
I get his point.  I was asking for cardiology input to see if something other than a statin could be of benefit.  I was only asking to get extra options for the patient to consider.  Thanks.

## 2018-12-27 NOTE — Telephone Encounter (Signed)
Frank Strickland- notify patient that I updated med list, removed pravastatin.  I am asking for cards input.  Thanks.   Dr. Mariah Milling- patient didn't tolerate crestor or pravastatin.  Would you prefer him to try zetia or refer him to the lipid clinic re: consideration of PSK treatment? Thanks.

## 2019-01-01 NOTE — Telephone Encounter (Signed)
I appreciate input from Dr. Mariah Milling with cardiology.  Please notify patient.  It may be reasonable to consider taking Zetia.  I get that the patient did not tolerate a statin.  This works differently and he may be able to tolerate Zetia without having aches.  Please have the patient think about this and let me know which way he wants to go.  I still think it makes sense to work on diet and exercise and I appreciate his effort on that.  I appreciate the help of all involved.  Thanks.

## 2019-01-01 NOTE — Telephone Encounter (Signed)
Could try zetia He has minimal coronary calcium on CT scan Would likely not qualify for PCSK9 inh as CT scan does not typically count

## 2019-01-02 NOTE — Telephone Encounter (Signed)
Left detailed message on VM per DPR. 

## 2019-04-19 ENCOUNTER — Other Ambulatory Visit: Payer: Self-pay

## 2019-04-19 ENCOUNTER — Encounter: Payer: Self-pay | Admitting: Adult Health

## 2019-04-19 ENCOUNTER — Ambulatory Visit: Payer: Managed Care, Other (non HMO) | Admitting: Adult Health

## 2019-04-19 VITALS — BP 128/90 | HR 63 | Temp 98.1°F | Resp 16 | Ht 68.0 in | Wt 239.0 lb

## 2019-04-19 DIAGNOSIS — Z0189 Encounter for other specified special examinations: Secondary | ICD-10-CM | POA: Diagnosis not present

## 2019-04-19 DIAGNOSIS — I77810 Thoracic aortic ectasia: Secondary | ICD-10-CM

## 2019-04-19 DIAGNOSIS — Z008 Encounter for other general examination: Secondary | ICD-10-CM | POA: Diagnosis not present

## 2019-04-19 DIAGNOSIS — K253 Acute gastric ulcer without hemorrhage or perforation: Secondary | ICD-10-CM | POA: Diagnosis not present

## 2019-04-19 MED ORDER — FAMOTIDINE 20 MG PO TABS: 20.0000 mg | ORAL_TABLET | Freq: Every day | ORAL | 0 refills | Status: DC

## 2019-04-19 NOTE — Progress Notes (Addendum)
[facility]  Frank Strickland DOB: 53 y.o. MRN: [id number]  Subjective:  Here for Biometric Screen/brief exam Patient is a 53 year old male in no acute distress. He reports he is doing well although with Covid 19 pandemic he has gained some weight. He reports his exercise has decreased.   He reports having a burning pain in his epigastric area especially with " eating the tomatoes out of his garden and taking a tablespoon of vinegar daily. He also reports he has been taking Aleve once or twice daily for months at least the last 6 months due to chronic knee pain history of meniscus injuries bilateral knees- he sees specialty and has no new symptoms or trauma he reports. He takes TUMS occasionally. He had cramps on omeprazole.   Denies any tarry stool, bleeding or mucous in stools. Denies any hemoptysios.  No cough.  He denies any chest pain, radiating neck, arm, jaw or shoulder pain. He denies any abdominal pain or back pain. He has seen Dr. Candis Musa 12/17/219 for questionable dilated mildly  aorta  and was cleared to follow up as needed. Ordered by Dr. 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  CT also shows pulmonary nodule history chronic Dr. Everlene Farrier previously reviewed this with patient.    Social History:  The patient  reports that he has quit smoking. He has never used smokeless tobacco. He reports current alcohol use. He reports that he does not use drugs Patient  denies any fever, body aches,chills, rash, chest pain, shortness of breath, nausea, vomiting, or diarrhea.    Objective: Blood pressure 128/90, pulse 63, temperature 98.1 F (36.7 C), temperature source Temporal, resp. rate 16, height 5\' 8"  (1.727 m), weight 239 lb (108.4 kg), SpO2 98 %. GEN: Well nourished, well developed, in no acute distress  HEENT: normal , supple, no cervical lymphadenopathy  Neck: no JVD, carotid bruits, or  masses Cardiac: RRR; no murmurs, rubs, or gallops,no edema  Respiratory:  clear to auscultation bilaterally, normal work of breathing GI: soft, round,nontender, nondistended, + bowel sounds, no bruit MS: no deformity or atrophy  Skin: warm and dry, no rash Neuro:  Strength and sensation are intact Psych: normal,  full affect Assessment: Biometric screen Encounter for other general examination - Plan: Comprehensive metabolic panel, CBC with Differential/Platelet, VITAMIN D 25 Hydroxy (Vit-D Deficiency, Fractures), Lipid Panel With LDL/HDL Ratio, CANCELED: Glucose, random, CANCELED: Lipid Panel With LDL/HDL Ratio  Encounter for biometric screening - Plan: Comprehensive metabolic panel, CBC with Differential/Platelet, VITAMIN D 25 Hydroxy (Vit-D Deficiency, Fractures), Lipid Panel With LDL/HDL Ratio, CANCELED: Glucose, random, CANCELED: Lipid Panel With LDL/HDL Ratio  Acute gastric ulcer, unspecified whether gastric ulcer hemorrhage or perforation present    Plan: Meds ordered this encounter  Medications  . famotidine (PEPCID) 20 MG tablet    Sig: Take 1 tablet (20 mg total) by mouth daily for 21 days.    Dispense:  21 tablet    Refill:  0   Avoid aleve, ibuprofen, motrin/ NSAIDS. Avoid acidic foods. May use tylenol as needed for chronic knee pain if needed per package instructions.  Avoid acidic foods.  Try 21 day dose of Pepcid.  Follow up with your primary care if any symptoms change worsen or persist at any time.  All symptoms should resolve and if they do not resolve and or worsen at anytime you should be seen immediately.  Follow up  with your primary care Joaquim Nam, MD for pulmonary nodules history per CT imaging.  within 1-2 weeks and cardiology Dr. Lewie Loron if needed.   Fasting glucose and lipids. Discussed with patient that today's visit here is a limited biometric screening visit (not a comprehensive exam or management of any chronic problems) Discussed some health  issues, including healthy eating habits and exercise. Encouraged to follow-up with PCP for annual comprehensive preventive and wellness care (and if applicable, any chronic issues). Questions invited and answered.  I will have the office call you on your glucose and cholesterol results when they return if you have not heard within 1 week please call the office.  This biometric physical is a brief physical and the only labs done are glucose and your lipid panel(cholesterol) and is  not a substitute for seeing a primary care provider for a complete annual physical. Please see a primary care physician for routine health maintenance, labs and full physical at least yearly and follow up as recommended by your provider. Provider also recommends if you do not have a primary care provider for patient to establish care as soon as possible .Patient may chose provider of choice. Also gave the [place]  PHYSICIAN/PROVIDER  REFERRAL LINE at [phone] or web site at [email or web address] to help assist with finding a primary care doctor.  Patient verbalizes understanding that his office is acute care only and not a substitute for a primary care or for the management of chronic conditions.

## 2019-04-19 NOTE — Patient Instructions (Signed)
Avoid aleve, ibuprofen, motrin/ NSAIDS. Avoid acidic foods. May use tylenol as needed for chronic knee pain if needed per package instructions.  Avoid acidic foods.  Try 21 day dose of Pepcid.  Follow up with your primary care if any symptoms change worsen or persist at any time.   The Biometric exam is a brief physical with labs including glucose and cholesterol. This does not replace a full physical with a primary care provider, and additional recommended labs. This is an acute care clinic not for maintenance of chronic or long standing conditions.   Provider also recommends if you do not have a primary care provider for patient to establish care promptly.You can choose any provider of your choice at any facility of your choice, the below information is  just a resource to aid in you finding a primary care provider for routine health maintenance.   [facility]  PHYSICIAN/PROVIDER  REFERRAL LINE at [phone]  [email or web address] to help assist with finding a primary care doctor.   Helpful resources below of other primary care office's accepting new patients.   Lovie Macadamia         [address]  [place]. [place] [address] [phone]  . [facility]    [address], [address] [place], [place] [address] [phone]  . [facility] [address]  [place], [place]  [phone]   . [facility]  [address], [address] [place] [address] [phone]    Follow up with primary care as needed for chronic and maintenance health care- can be seen in this employee clinic for acute care.    Health Maintenance, Male Adopting a healthy lifestyle and getting preventive care are important in promoting health and wellness. Ask your health care provider about:  The right schedule for you to have regular tests and exams.  Things you can do on your own to prevent  diseases and keep yourself healthy. What should I know about diet, weight, and exercise? Eat a healthy diet   Eat a diet that includes plenty of vegetables, fruits, low-fat dairy products, and lean protein.  Do not eat a lot of foods that are high in solid fats, added sugars, or sodium. Maintain a healthy weight Body mass index (BMI) is a measurement that can be used to identify possible weight problems. It estimates body fat based on height and weight. Your health care provider can help determine your BMI and help you achieve or maintain a healthy weight. Get regular exercise Get regular exercise. This is one of the most important things you can do for your health. Most adults should:  Exercise for at least 150 minutes each week. The exercise should increase your heart rate and make you sweat (moderate-intensity exercise).  Do strengthening exercises at least twice a week. This is in addition to the moderate-intensity exercise.  Spend less time sitting. Even light physical activity can be beneficial. Watch cholesterol and blood lipids Have your blood tested for lipids and cholesterol at 55 years of age, then have this test every 5 years. You may need to have your cholesterol levels checked more often if:  Your lipid or cholesterol levels are high.  You are older than 55 years of age.  You are at high risk for heart disease. What should I know about cancer screening? Many types of cancers can be detected early and may often be prevented.  Depending on your health history and family history, you may need to have cancer screening at various ages. This may include screening for:  Colorectal cancer.  Prostate cancer.  Skin cancer.  Lung cancer. What should I know about heart disease, diabetes, and high blood pressure? Blood pressure and heart disease  High blood pressure causes heart disease and increases the risk of stroke. This is more likely to develop in people who have high  blood pressure readings, are of African descent, or are overweight.  Talk with your health care provider about your target blood pressure readings.  Have your blood pressure checked: ? Every 3-5 years if you are 6918-55 years of age. ? Every year if you are 55 years old or older.  If you are between the ages of 4865 and 975 and are a current or former smoker, ask your health care provider if you should have a one-time screening for abdominal aortic aneurysm (AAA). Diabetes Have regular diabetes screenings. This checks your fasting blood sugar level. Have the screening done:  Once every three years after age 55 if you are at a normal weight and have a low risk for diabetes.  More often and at a younger age if you are overweight or have a high risk for diabetes. What should I know about preventing infection? Hepatitis B If you have a higher risk for hepatitis B, you should be screened for this virus. Talk with your health care provider to find out if you are at risk for hepatitis B infection. Hepatitis C Blood testing is recommended for:  Everyone born from [id number] through 1967.  Anyone with known risk factors for hepatitis C. Sexually transmitted infections (STIs)  You should be screened each year for STIs, including gonorrhea and chlamydia, if: ? You are sexually active and are younger than 55 years of age. ? You are older than 55 years of age and your health care provider tells you that you are at risk for this type of infection. ? Your sexual activity has changed since you were last screened, and you are at increased risk for chlamydia or gonorrhea. Ask your health care provider if you are at risk.  Ask your health care provider about whether you are at high risk for HIV. Your health care provider may recommend a prescription medicine to help prevent HIV infection. If you choose to take medicine to prevent HIV, you should first get tested for HIV. You should then be tested every 3 months for  as long as you are taking the medicine. Follow these instructions at home: Lifestyle  Do not use any products that contain nicotine or tobacco, such as cigarettes, e-cigarettes, and chewing tobacco. If you need help quitting, ask your health care provider.  Do not use street drugs.  Do not share needles.  Ask your health care provider for help if you need support or information about quitting drugs. Alcohol use  Do not drink alcohol if your health care provider tells you not to drink.  If you drink alcohol: ? Limit how much you have to 0-2 drinks a day. ? Be aware of how much alcohol is in your drink. In the [place] one drink equals one 12 oz bottle of beer (355 mL), one 5 oz glass of wine (148 mL), or one 1 oz glass of hard liquor (44 mL). General instructions  Schedule regular health, dental, and eye exams.  Stay current with your vaccines.  Tell your health care provider if: ? You  often feel depressed. ? You have ever been abused or do not feel safe at home. Summary  Adopting a healthy lifestyle and getting preventive care are important in promoting health and wellness.  Follow your health care provider's instructions about healthy diet, exercising, and getting tested or screened for diseases.  Follow your health care provider's instructions on monitoring your cholesterol and blood pressure. This information is not intended to replace advice given to you by your health care provider. Make sure you discuss any questions you have with your health care provider. Document Released: 01/30/2008 Document Revised: 07/27/2018 Document Reviewed: 07/27/2018 Elsevier Patient Education  2020 Elsevier Inc. Peptic Ulcer Eating Plan Peptic ulcers are sores that form on the lining of the stomach, esophagus, or the part of the small intestine that is attached to the stomach (duodenum). These sores are also called stomach ulcers. When ulcers develop, they can cause a burning feeling in the  stomach as well as bloating, nausea, vomiting, and poor appetite. If you have a history of peptic ulcers, it is important to keep track of what foods and drinks cause symptoms. What are tips for following this plan?   Eat a healthy, well-balanced diet. This includes: ? Fresh fruits and vegetables. Eat a variety of colors of fruits and vegetables. ? Whole grains. Try to make sure at least half of the grains you eat each day are whole grains. ? Low-fat dairy. ? Lean meat, fish, poultry, eggs, beans, and nuts. ? Healthy fats, such as olive oil, grapeseed oil, or canola oil. Try to eat less than 8 teaspoons of fats and oils each day.  Avoid foods that cause irritation or pain. These may be different for different people. Keep a food diary to identify foods that cause symptoms.  Avoid processed foods that have added salt and sugar.  Avoid drinking alcohol.  Avoid drinks with caffeine, such as cola, black tea, energy drinks, and coffee. Recommended foods Grains  Whole grains. Vegetables  All fresh or frozen vegetables. Low-sodium canned vegetables. Fruits  All fresh, frozen, or dried fruit. Fruit canned in juice. Meats and other protein foods  Lean cuts of meat. Skinless poultry. Fresh or canned fish. Eggs. Tofu. Nuts and nut butter. Dried beans. Low-sodium canned beans. Dairy  Low-fat or nonfat (skim) milk. Nonfat or low-fat yogurt. Nonfat or low-fat cheese. Beverages  Water. Soy or nut milks. Caffeine-free soft drinks. Herbal tea. Fats and oils  Olive oil. Canola oil. Grapeseed oil. Sunflower oil. Seasoning and other foods  Low-fat salad dressing. Ketchup. Low-fat mayonnaise. All spices except pepper. Low-sodium seasoning mixes. Foods to avoid Meats and other protein foods  Fatty meats. Fried meats. Any meat that causes symptoms. Dairy  Whole milk. Ice cream. Cream. Chocolate milk. Beverages  Alcohol. Coffee. Cola and energy drinks. Black or green tea. Cocoa. Fats  and oils  Butter. Lard. Ghee. Seasoning and other foods  Pepper. Hot sauce. Any seasonings or condiments that cause symptoms. Summary  Peptic ulcers can cause burning in the stomach as well as bloating, nausea, vomiting, and poor appetite. You may be able to limit symptoms by avoiding foods that make you feel worse.  Work with your dietitian or health care provider to identify foods that cause symptoms. This may include caffeinated drinks, alcohol, or pepper. This information is not intended to replace advice given to you by your health care provider. Make sure you discuss any questions you have with your health care provider. Document Released: 10/26/2011 Document Revised: 07/16/2017 Document Reviewed:  09/14/2016 Elsevier Patient Education  2020 Elsevier Inc. Peptic Ulcer  A peptic ulcer is a painful sore in the lining of your stomach or the first part of your small intestine. What are the causes? Common causes of this condition include:  An infection.  Using certain pain medicines too often or too much. What increases the risk? You are more likely to get this condition if you:  Smoke.  Have a family history of ulcer disease.  Drink alcohol.  Have been hospitalized in an intensive care unit (ICU). What are the signs or symptoms? Symptoms include:  Burning pain in the area between the chest and the belly button. The pain may: ? Not go away (be persistent). ? Be worse when your stomach is empty. ? Be worse at night.  Heartburn.  Feeling sick to your stomach (nauseous) and throwing up (vomiting).  Bloating. If the ulcer results in bleeding, it can cause you to:  Have poop (stool) that is black and looks like tar.  Throw up bright red blood.  Throw up material that looks like coffee grounds. How is this treated? Treatment for this condition may include:  Stopping things that can cause the ulcer, such as: ? Smoking. ? Using pain medicines.  Medicines to reduce  stomach acid.  Antibiotic medicines if the ulcer is caused by an infection.  A procedure that is done using a small, flexible tube that has a camera at the end (upper endoscopy). This may be done if you have a bleeding ulcer.  Surgery. This may be needed if: ? You have a lot of bleeding. ? The ulcer caused a hole somewhere in the digestive system. Follow these instructions at home:  Do not drink alcohol if your doctor tells you not to drink.  Limit how much caffeine you take in.  Do not use any products that contain nicotine or tobacco, such as cigarettes, e-cigarettes, and chewing tobacco. If you need help quitting, ask your doctor.  Take over-the-counter and prescription medicines only as told by your doctor. ? Do not stop or change your medicines unless you talk with your doctor about it first. ? Do not take aspirin, ibuprofen, or other NSAIDs unless your doctor told you to do so.  Keep all follow-up visits as told by your doctor. This is important. Contact a doctor if:  You do not get better in 7 days after you start treatment.  You keep having an upset stomach (indigestion) or heartburn. Get help right away if:  You have sudden, sharp pain in your belly (abdomen).  You have belly pain that does not go away.  You have bloody poop (stool) or black, tarry poop.  You throw up blood. It may look like coffee grounds.  You feel light-headed or feel like you may pass out (faint).  You get weak.  You get sweaty or feel sticky and cold to the touch (clammy). Summary  Symptoms of a peptic ulcer include burning pain in the area between the chest and the belly button.  Take medicines only as told by your doctor.  Limit how much alcohol and caffeine you have.  Keep all follow-up visits as told by your doctor. This information is not intended to replace advice given to you by your health care provider. Make sure you discuss any questions you have with your health care  provider. Document Released: 10/28/2009 Document Revised: 02/08/2018 Document Reviewed: 02/08/2018 Elsevier Patient Education  2020 Elsevier Inc. Famotidine tablets or gelcaps What is this  medicine? FAMOTIDINE (fa MOE ti deen) is a type of antihistamine that blocks the release of stomach acid. It is used to treat stomach or intestinal ulcers. It can also relieve heartburn from acid reflux. This medicine may be used for other purposes; ask your health care provider or pharmacist if you have questions. COMMON BRAND NAME(S): Heartburn Relief, Pepcid, Pepcid AC, Pepcid AC Maximum Strength What should I tell my health care provider before I take this medicine? They need to know if you have any of these conditions:  kidney or liver disease  trouble swallowing  an unusual or allergic reaction to famotidine, other medicines, foods, dyes, or preservatives  pregnant or trying to get pregnant  breast-feeding How should I use this medicine? Take this medicine by mouth with a glass of water. Follow the directions on the prescription label. If you only take this medicine once a day, take it at bedtime. Take your doses at regular intervals. Do not take your medicine more often than directed. Talk to your pediatrician regarding the use of this medicine in children. Special care may be needed. Overdosage: If you think you have taken too much of this medicine contact a poison control center or emergency room at once. NOTE: This medicine is only for you. Do not share this medicine with others. What if I miss a dose? If you miss a dose, take it as soon as you can. If it is almost time for your next dose, take only that dose. Do not take double or extra doses. What may interact with this medicine?  delavirdine  itraconazole  ketoconazole This list may not describe all possible interactions. Give your health care provider a list of all the medicines, herbs, non-prescription drugs, or dietary supplements  you use. Also tell them if you smoke, drink alcohol, or use illegal drugs. Some items may interact with your medicine. What should I watch for while using this medicine? Tell your doctor or health care professional if your condition does not start to get better or if it gets worse. Finish the full course of tablets prescribed, even if you feel better. Do not take with aspirin, ibuprofen or other antiinflammatory medicines. These can make your condition worse. Do not smoke cigarettes or drink alcohol. These cause irritation in your stomach and can increase the time it will take for ulcers to heal. If you get black, tarry stools or vomit up what looks like coffee grounds, call your doctor or health care professional at once. You may have a bleeding ulcer. This medicine may cause a decrease in vitamin B12. You should make sure that you get enough vitamin B12 while you are taking this medicine. Discuss the foods you eat and the vitamins you take with your health care professional. What side effects may I notice from receiving this medicine? Side effects that you should report to your doctor or health care professional as soon as possible:  agitation, nervousness  confusion  hallucinations  skin rash, itching Side effects that usually do not require medical attention (report to your doctor or health care professional if they continue or are bothersome):  constipation  diarrhea  dizziness  headache This list may not describe all possible side effects. Call your doctor for medical advice about side effects. You may report side effects to FDA at 1-800-FDA-1088. Where should I keep my medicine? Keep out of the reach of children. Store at room temperature between 15 and 30 degrees C (59 and 86 degrees F). Do not  freeze. Throw away any unused medicine after the expiration date. NOTE: This sheet is a summary. It may not cover all possible information. If you have questions about this medicine, talk  to your doctor, pharmacist, or health care provider.  2020 Elsevier/Gold Standard (2017-03-19 13:17:50)

## 2019-04-20 LAB — COMPREHENSIVE METABOLIC PANEL
ALT: 16 IU/L (ref 0–44)
AST: 24 IU/L (ref 0–40)
Albumin/Globulin Ratio: 2 (ref 1.2–2.2)
Albumin: 4.3 g/dL (ref 3.8–4.9)
Alkaline Phosphatase: 56 IU/L (ref 39–117)
BUN/Creatinine Ratio: 25 — ABNORMAL HIGH (ref 9–20)
BUN: 20 mg/dL (ref 6–24)
Bilirubin Total: 1 mg/dL (ref 0.0–1.2)
CO2: 21 mmol/L (ref 20–29)
Calcium: 8.7 mg/dL (ref 8.7–10.2)
Chloride: 102 mmol/L (ref 96–106)
Creatinine, Ser: 0.8 mg/dL (ref 0.76–1.27)
GFR calc Af Amer: 117 mL/min/{1.73_m2} (ref >59)
GFR calc non Af Amer: 101 mL/min/{1.73_m2} (ref >59)
Globulin, Total: 2.1 g/dL (ref 1.5–4.5)
Glucose: 95 mg/dL (ref 65–99)
Potassium: 3.9 mmol/L (ref 3.5–5.2)
Sodium: 140 mmol/L (ref 134–144)
Total Protein: 6.4 g/dL (ref 6.0–8.5)

## 2019-04-20 LAB — LIPID PANEL WITH LDL/HDL RATIO
Cholesterol, Total: 230 mg/dL — ABNORMAL HIGH (ref 100–199)
HDL: 54 mg/dL (ref >39)
LDL Chol Calc (NIH): 148 mg/dL — ABNORMAL HIGH (ref 0–99)
LDL/HDL Ratio: 2.7 ratio (ref 0.0–3.6)
Triglycerides: 154 mg/dL — ABNORMAL HIGH (ref 0–149)
VLDL Cholesterol Cal: 28 mg/dL (ref 5–40)

## 2019-04-20 LAB — CBC WITH DIFFERENTIAL/PLATELET
Basophils Absolute: 0 10*3/uL (ref 0.0–0.2)
Basos: 1 %
EOS (ABSOLUTE): 0.1 10*3/uL (ref 0.0–0.4)
Eos: 3 %
Hematocrit: 43.3 % (ref 37.5–51.0)
Hemoglobin: 15 g/dL (ref 13.0–17.7)
Immature Grans (Abs): 0 10*3/uL (ref 0.0–0.1)
Immature Granulocytes: 0 %
Lymphocytes Absolute: 2 10*3/uL (ref 0.7–3.1)
Lymphs: 39 %
MCH: 33.3 pg — ABNORMAL HIGH (ref 26.6–33.0)
MCHC: 34.6 g/dL (ref 31.5–35.7)
MCV: 96 fL (ref 79–97)
Monocytes Absolute: 0.3 10*3/uL (ref 0.1–0.9)
Monocytes: 6 %
Neutrophils Absolute: 2.6 10*3/uL (ref 1.4–7.0)
Neutrophils: 51 %
Platelets: 163 10*3/uL (ref 150–450)
RBC: 4.51 x10E6/uL (ref 4.14–5.80)
RDW: 12.3 % (ref 11.6–15.4)
WBC: 5.1 10*3/uL (ref 3.4–10.8)

## 2019-04-20 LAB — VITAMIN D 25 HYDROXY (VIT D DEFICIENCY, FRACTURES): Vit D, 25-Hydroxy: 41.5 ng/mL (ref 30.0–100.0)

## 2019-05-09 ENCOUNTER — Emergency Department
Admission: EM | Admit: 2019-05-09 | Discharge: 2019-05-09 | Disposition: A | Discharge status: Home / Self Care | Payer: Managed Care, Other (non HMO) | Attending: Student | Admitting: Student

## 2019-05-09 ENCOUNTER — Emergency Department: Payer: Managed Care, Other (non HMO)

## 2019-05-09 ENCOUNTER — Other Ambulatory Visit: Payer: Self-pay

## 2019-05-09 ENCOUNTER — Telehealth: Payer: Self-pay

## 2019-05-09 ENCOUNTER — Encounter: Payer: Self-pay | Admitting: Intensive Care

## 2019-05-09 DIAGNOSIS — R1013 Epigastric pain: Secondary | ICD-10-CM

## 2019-05-09 DIAGNOSIS — I10 Essential (primary) hypertension: Secondary | ICD-10-CM | POA: Diagnosis not present

## 2019-05-09 DIAGNOSIS — R0789 Other chest pain: Secondary | ICD-10-CM | POA: Diagnosis not present

## 2019-05-09 DIAGNOSIS — Z87891 Personal history of nicotine dependence: Secondary | ICD-10-CM | POA: Diagnosis not present

## 2019-05-09 DIAGNOSIS — R079 Chest pain, unspecified: Secondary | ICD-10-CM

## 2019-05-09 DIAGNOSIS — Z79899 Other long term (current) drug therapy: Secondary | ICD-10-CM | POA: Diagnosis not present

## 2019-05-09 LAB — TROPONIN I (HIGH SENSITIVITY)
Troponin I (High Sensitivity): 4 ng/L (ref <18)
Troponin I (High Sensitivity): 4 ng/L (ref <18)

## 2019-05-09 LAB — HEPATIC FUNCTION PANEL
ALT: 17 U/L (ref 0–44)
AST: 29 U/L (ref 15–41)
Albumin: 3.8 g/dL (ref 3.5–5.0)
Alkaline Phosphatase: 44 U/L (ref 38–126)
Bilirubin, Direct: <0.1 mg/dL (ref 0.0–0.2)
Total Bilirubin: 1.3 mg/dL — ABNORMAL HIGH (ref 0.3–1.2)
Total Protein: 6.2 g/dL — ABNORMAL LOW (ref 6.5–8.1)

## 2019-05-09 LAB — CBC
HCT: 42.1 % (ref 39.0–52.0)
Hemoglobin: 14.9 g/dL (ref 13.0–17.0)
MCH: 32.7 pg (ref 26.0–34.0)
MCHC: 35.4 g/dL (ref 30.0–36.0)
MCV: 92.3 fL (ref 80.0–100.0)
Platelets: 166 10*3/uL (ref 150–400)
RBC: 4.56 MIL/uL (ref 4.22–5.81)
RDW: 11.2 % — ABNORMAL LOW (ref 11.5–15.5)
WBC: 5.8 10*3/uL (ref 4.0–10.5)
nRBC: 0 % (ref 0.0–0.2)

## 2019-05-09 LAB — BASIC METABOLIC PANEL
Anion gap: 8 (ref 5–15)
BUN: 17 mg/dL (ref 6–20)
CO2: 25 mmol/L (ref 22–32)
Calcium: 8.6 mg/dL — ABNORMAL LOW (ref 8.9–10.3)
Chloride: 105 mmol/L (ref 98–111)
Creatinine, Ser: 0.59 mg/dL — ABNORMAL LOW (ref 0.61–1.24)
GFR calc Af Amer: >60 mL/min (ref >60)
GFR calc non Af Amer: >60 mL/min (ref >60)
Glucose, Bld: 101 mg/dL — ABNORMAL HIGH (ref 70–99)
Potassium: 3.6 mmol/L (ref 3.5–5.1)
Sodium: 138 mmol/L (ref 135–145)

## 2019-05-09 LAB — LIPASE, BLOOD: Lipase: 28 U/L (ref 11–51)

## 2019-05-09 MED ORDER — ALUM & MAG HYDROXIDE-SIMETH 200-200-20 MG/5ML PO SUSP
30.0000 mL | Freq: Once | ORAL | Status: AC
  Administered 2019-05-09: 30 mL via ORAL
  Filled 2019-05-09: qty 30

## 2019-05-09 MED ORDER — ASPIRIN 325 MG PO TABS
325.0000 mg | ORAL_TABLET | Freq: Once | ORAL | Status: DC
  Filled 2019-05-09: qty 1

## 2019-05-09 MED ORDER — FAMOTIDINE 20 MG PO TABS: 20.0000 mg | ORAL_TABLET | Freq: Two times a day (BID) | ORAL | 0 refills | Status: DC

## 2019-05-09 MED ORDER — ASPIRIN 81 MG PO CHEW
324.0000 mg | CHEWABLE_TABLET | Freq: Once | ORAL | Status: AC
  Administered 2019-05-09: 11:00:00 324 mg via ORAL

## 2019-05-09 MED ORDER — ASPIRIN 81 MG PO CHEW
CHEWABLE_TABLET | ORAL | Status: AC
  Administered 2019-05-09: 324 mg via ORAL
  Filled 2019-05-09: qty 4

## 2019-05-09 MED ORDER — FAMOTIDINE IN NACL 20-0.9 MG/50ML-% IV SOLN
20.0000 mg | Freq: Once | INTRAVENOUS | Status: AC
  Administered 2019-05-09: 11:00:00 20 mg via INTRAVENOUS
  Filled 2019-05-09: qty 50

## 2019-05-09 NOTE — ED Triage Notes (Signed)
Patient c/o epigastric pain that radiates to back since Friday. EMS administered 1 spray nitro and patient reported relief. A&O x4

## 2019-05-09 NOTE — Discharge Instructions (Signed)
Thank you for letting us take care of you in the emergency department today.   Please continue to take any regular, prescribed medications.   New medications we have prescribed:  - famotidine - take as directed  Please follow up with: - Cardiology and GI doctors - Your primary care doctor to review your ER visit and follow up on your symptoms.   Please return to the ER for any new or worsening symptoms.

## 2019-05-09 NOTE — ED Provider Notes (Signed)
[facility] Emergency Department Provider Note  ____________________________________________   First MD Initiated Contact with Patient 05/09/19 [phone]     (approximate)  I have reviewed the triage vital signs and the nursing notes.  History  Chief Complaint Chest Pain    HPI Frank Strickland is a 53 y.o. male with a history of GERD, hypertension, hyperlipidemia who presents to the emergency department for epigastric abdominal pain.  Patient states the pain has been present for several days.  Moderate in severity.  Sharp.  Seems to radiate to the back.  Associated with nausea, but no vomiting. Seems to improve when he is moving around/distracted.  No fevers or shortness of breath.  He does take frequent Aleve for arthritis, drinks alcohol every other day.  No associated diarrhea.  No lower extremity weakness, numbness, tingling.   Past Medical Hx Past Medical History:  Diagnosis Date  . Allergy, unspecified not elsewhere classified   . Anxiety   . Duodenal ulcer    2014  . Hyperlipidemia   . Hypertension     Problem List Patient Active Problem List   Diagnosis Date Noted  . 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 Coronary artery calcification seen on CAT scan 08/01/2018  . 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 Advance care planning 12/10/2017  . Wrist pain 10/06/2017  . Positive ANA (antinuclear antibody) 02/05/2017  . Rheumatoid factor positive 02/05/2017  . Positive anti-CCP test 02/05/2017  . Vitamin D deficiency 07/02/2016  . Erectile dysfunction 07/02/2016  . GERD (gastroesophageal reflux disease) 07/02/2016  . Prostatitis 01/17/2016  . Pulmonary nodule 09/18/2015  . Routine general medical examination at a health care facility 08/17/2012  . HLD (hyperlipidemia) 10/12/2007  . ESSENTIAL HYPERTENSION, BENIGN 10/10/2007  . ANXIETY DEPRESSION 02/22/2007  . Allergy 02/22/2007    Past Surgical Hx Past Surgical History:  Procedure Laterality  Date  . KIDNEY STONE SURGERY  2002   X 2 cystoscopy with stent    Medications Prior to Admission medications   Medication Sig Start Date End Date Taking? Authorizing Provider  Cholecalciferol 1000 units TBDP Take 1 tablet by mouth daily. 07/01/16   Tonia Ghent, MD  famotidine (PEPCID) 20 MG tablet Take 1 tablet (20 mg total) by mouth daily for 21 days. 04/19/19 05/10/19  Flinchum, Kelby Aline, FNP  FLUoxetine (PROZAC) 20 MG capsule TAKE ONE CAPSULE BY MOUTH EVERY DAY 12/22/18   Tonia Ghent, MD  fluticasone [facility]) 50 MCG/ACT nasal spray Place 2 sprays into both nostrils daily. 07/07/17   Fisher, [place] Dolin, PA-C  hydrochlorothiazide (HYDRODIURIL) 25 MG tablet Take 1 tablet (25 mg total) by mouth daily. 07/03/18   Tonia Ghent, MD  Multiple Vitamin (MULTIVITAMIN) tablet Take 1 tablet by mouth daily.    [provider]  traMADol (ULTRAM) 50 MG tablet Take 1 tablet (50 mg total) by mouth every 8 (eight) hours as needed. 09/07/18   Tonia Ghent, MD    Allergies Ace inhibitors, Angiotensin receptor blockers, Lisinopril, Amlodipine, Codeine sulfate, Crestor [rosuvastatin calcium], Ketorolac tromethamine, Omeprazole, Pravastatin, and Tessalon [benzonatate]  Family Hx Family History  Problem Relation Age of Onset  . Allergies Mother   . Cancer Father        lung-smoker  . Heart disease Sister   . Hypertension Sister     Social Hx Social History   Tobacco Use  . Smoking status: Former Smoker    Types: Cigarettes  . Smokeless tobacco: Never Used  . Tobacco comment:  quit 1997  Substance Use Topics  . Alcohol use: Yes    Alcohol/week: 12.0 standard drinks    Types: 12 Cans of beer per week    Comment: Occasional  . Drug use: No     Review of Systems  Constitutional: Negative for fever, chills. Eyes: Negative for visual changes. ENT: Negative for sore throat. Cardiovascular: Negative for chest pain. Respiratory: Negative for shortness of breath.  Gastrointestinal: Negative for nausea, vomiting. + epigastric abdominal pain Genitourinary: Negative for dysuria. Musculoskeletal: Negative for leg swelling. Skin: Negative for rash. Neurological: Negative for for headaches.   Physical Exam  Vital Signs: ED Triage Vitals  Enc Vitals Group     BP 05/09/19 0837 (!) 146/97     Pulse Rate 05/09/19 0837 (!) 51     Resp 05/09/19 0837 (!) 9     Temp 05/09/19 0837 97.7 F (36.5 C)     Temp Source 05/09/19 0837 Oral     SpO2 05/09/19 0837 100 %     Weight 05/09/19 0838 239 lb (108.4 kg)     Height 05/09/19 0838 5\' 8"  (1.727 m)     Head Circumference --      Peak Flow --      Pain Score 05/09/19 0838 3     Pain Loc --      Pain Edu? --      Excl. in GC? --     Constitutional: Alert and oriented.  Head: Normocephalic. Atraumatic. Eyes: Conjunctivae clear. Sclera anicteric. Nose: No congestion. No rhinorrhea. Mouth/Throat: Mucous membranes are moist.  Neck: No stridor.   Cardiovascular: Normal rate, regular rhythm. No murmurs. Extremities well perfused. Equal symmetric distal pulses.  Respiratory: Normal respiratory effort.  Lungs CTAB. Gastrointestinal: Soft. Mild epigastric tenderness, no rebound or guarding. Remainder of abdomen is soft and NT. Non-distended.  Musculoskeletal: No lower extremity edema. No deformities. Neurologic:  Normal speech and language. No gross focal neurologic deficits are appreciated.  Skin: Skin is warm, dry and intact. No rash noted. Psychiatric: Mood and affect are appropriate for situation.  EKG  Personally reviewed.   Rate: 51 Rhythm: sinus Axis: slightly leftward Intervals: WNL No acute ischemic changes No STEMI    Radiology  XR: IMPRESSION: Low lung volumes with mild bibasilar atelectasis.     Procedures  Procedure(s) performed (including critical care):  Procedures   Initial Impression / Assessment and Plan / ED Course  53 y.o. male who presents to the ED for  epigastric abdominal pain, as above.  Ddx: gastritis, GERD, pancreatitis, biliary colic, atypical ACS. Do not suspect dissection given equal and symmetric distal pulses throughout (radial/DP), no associated neurological symptoms.   Plan: labs, EKG, imaging, sx control and reassess  Labs without actionable derangements, normal LFTs, normal lipase.  EKG without acute ischemic changes, high-sensitivity troponin 4, then 4. Given negative work up, will plan for DC w/ Rx for famotidine, and advised follow up with GI and cardiology. Patient agreeable w/ plan. Given return precautions.   Final Clinical Impression(s) / ED Diagnosis  Final diagnoses:  Chest pain in adult  Epigastric pain       Note:  This document was prepared using Dragon voice recognition software and may include unintentional dictation errors.   55., MD 05/09/19 [phone]

## 2019-05-09 NOTE — ED Notes (Signed)
Patient ambulatory to lobby with steady gait and NAD noted. Verbalized understanding of discharge instructions and follow-up care.  

## 2019-05-09 NOTE — Telephone Encounter (Signed)
[place] Night - Client TELEPHONE ADVICE RECORD [facility] Patient Name: Frank Strickland Gender: Male DOB: 06-07-1966 Age: 53 Y 61 M 4 D Return Phone Number: [phone] (Primary), [phone] (Secondary) Address: City/State/Zip: [place] [address] Client [facility] Night - Client Client Site [place] Physician Renford Dills - MD Contact Type Call Who Is Calling Patient / Member / Family / Caregiver Call Type Triage / Clinical Relationship To Patient Self Return Phone Number [phone] (Primary) Chief Complaint CHEST PAIN (>=21 years) - pain, pressure, heaviness or tightness Reason for Call Symptomatic / Request for [place] states that he has chest and back pain. He feels like it's hurting in his lungs and they feel heavy. It started yesterday, but he feels worse today. Translation No Nurse Assessment Nurse: Loletha Grayer, RN, Tammy Date/Time (Eastern Time): 05/09/2019 7:44:39 AM Confirm and document reason for call. If symptomatic, describe symptoms. ---Caller states he is having chest and back pain starting yesterday but it worse today. No SOB. No fever. Slight HA. Has the patient had close contact with a person known or suspected to have the novel coronavirus illness OR traveled / lives in area with major community spread (including international travel) in the last 14 days from the onset of symptoms? * If Asymptomatic, screen for exposure and travel within the last 14 days. ---No Does the patient have any new or worsening symptoms? ---Yes Will a triage be completed? ---Yes Related visit to physician within the last 2 weeks? ---No Does the PT have any chronic conditions? (i.e. diabetes, asthma, this includes High risk factors for pregnancy, etc.) ---Yes List chronic conditions. ---HTN Is this a behavioral health or substance abuse call?  ---No Guidelines Guideline Title Affirmed Question Affirmed Notes Nurse Date/Time Eilene Ghazi Time) Chest Pain [1] Chest pain lasts > 5 minutes AND [2] age > 31 Loletha Grayer, RN, Tammy 05/09/2019 7:47:27 AM PLEASE NOTE: All timestamps contained within this report are represented as [place] Standard Time. CONFIDENTIALTY NOTICE: This fax transmission is intended only for the addressee. It contains information that is legally privileged, confidential or otherwise protected from use or disclosure. If you are not the intended recipient, you are strictly prohibited from reviewing, disclosing, copying using or disseminating any of this information or taking any action in reliance on or regarding this information. If you have received this fax in error, please notify us immediately by telephone so that we can arrange for its return to [place]. Phone: [phone], Toll-Free: [phone], Fax: [phone] Page: 2 of 2 Call Id: [id number] Frank. Time Eilene Ghazi Time) Disposition Final User 05/09/2019 7:40:30 AM Send to Urgent Queue Windy Canny 05/09/2019 7:53:10 AM 911 Outcome Documentation Loletha Grayer, RN, Tammy Reason: No answer upon call back 05/09/2019 7:49:12 AM Call EMS 911 Now Yes Loletha Grayer, RN, Tammy Caller Disagree/Comply Comply Caller Understands Yes PreDisposition InappropriateToAsk Care Advice Given Per Guideline CALL EMS 911 NOW: * Immediate medical attention is needed. You need to hang up and call 911 (or an ambulance). * Triager Discretion: I'll call you back in a few minutes to be sure you were able to reach them. CARE ADVICE given per Chest Pain (Adult) guideline. Referrals GO TO FACILITY UNDECIDED

## 2019-05-09 NOTE — Telephone Encounter (Signed)
Per chart review tab pt is at [facility] ED. 

## 2019-05-10 NOTE — Telephone Encounter (Signed)
Noted. Thanks.

## 2019-05-21 ENCOUNTER — Other Ambulatory Visit: Payer: Self-pay | Admitting: Family Medicine

## 2019-05-21 DIAGNOSIS — I77810 Thoracic aortic ectasia: Secondary | ICD-10-CM

## 2019-05-21 NOTE — Progress Notes (Signed)
Call pt. Due for follow-up CT regarding lung nodules and aorta changes.  I put in the order for CTA.  Thanks.

## 2019-05-22 ENCOUNTER — Telehealth: Payer: Self-pay | Admitting: Family Medicine

## 2019-05-22 NOTE — Addendum Note (Signed)
Addended by: Tonia Ghent on: 05/22/2019 01:51 PM   Modules accepted: Orders

## 2019-05-22 NOTE — Progress Notes (Signed)
Duly noted.  I'll defer to cards.  Thanks.   I cancelled the order.

## 2019-05-22 NOTE — Progress Notes (Signed)
Established Patient Office Visit  Subjective:  Patient ID: Frank Strickland, male    DOB: 1966-01-15  Age: 53 y.o. MRN: [id number]  CC: No chief complaint on file.   HPI Frank Strickland presents for   Past Medical History:  Diagnosis Date  . Allergy, unspecified not elsewhere classified   . Anxiety   . Duodenal ulcer    2014  . Hyperlipidemia   . Hypertension     Past Surgical History:  Procedure Laterality Date  . KIDNEY STONE SURGERY  2002   X 2 cystoscopy with stent    Family History  Problem Relation Age of Onset  . Allergies Mother   . Cancer Father        lung-smoker  . Heart disease Sister   . Hypertension Sister     Social History   Socioeconomic History  . Marital status: Married    Spouse name: Not on file  . Number of children: 3  . Years of education: Not on file  . Highest education level: Not on file  Occupational History    Employer: [employer]  Social Needs  . Financial resource strain: Not on file  . Food insecurity    Worry: Not on file    Inability: Not on file  . Transportation needs    Medical: Not on file    Non-medical: Not on file  Tobacco Use  . Smoking status: Former Smoker    Types: Cigarettes  . Smokeless tobacco: Never Used  . Tobacco comment: quit 1997  Substance and Sexual Activity  . Alcohol use: Yes    Alcohol/week: 12.0 standard drinks    Types: 12 Cans of beer per week    Comment: Occasional  . Drug use: No  . Sexual activity: Not on file  Lifestyle  . Physical activity    Days per week: Not on file    Minutes per session: Not on file  . Stress: Not on file  Relationships  . Social Herbalist on phone: Not on file    Gets together: Not on file    Attends religious service: Not on file    Active member of club or organization: Not on file    Attends meetings of clubs or organizations: Not on file    Relationship status: Not on file  . Intimate partner violence    Fear of current or ex partner:  Not on file    Emotionally abused: Not on file    Physically abused: Not on file    Forced sexual activity: Not on file  Other Topics Concern  . Not on file  Social History Narrative   remarried, 1991   1 child, 2 step kids   Works for [employer], Games developer    Outpatient Medications Prior to Visit  Medication Sig Dispense Refill  . Cholecalciferol 1000 units TBDP Take 1 tablet by mouth daily.    . famotidine (PEPCID) 20 MG tablet Take 1 tablet (20 mg total) by mouth 2 (two) times daily. 120 tablet 0  . FLUoxetine (PROZAC) 20 MG capsule TAKE ONE CAPSULE BY MOUTH EVERY DAY 90 capsule 1  . fluticasone (FLONASE) 50 MCG/ACT nasal spray Place 2 sprays into both nostrils daily. 16 g 6  . hydrochlorothiazide (HYDRODIURIL) 25 MG tablet Take 1 tablet (25 mg total) by mouth daily. 90 tablet 3  . Multiple Vitamin (MULTIVITAMIN) tablet Take 1 tablet by mouth daily.    . traMADol (ULTRAM) 50  MG tablet Take 1 tablet (50 mg total) by mouth every 8 (eight) hours as needed. 20 tablet 0   No facility-administered medications prior to visit.     Allergies  Allergen Reactions  . Ace Inhibitors   . Angiotensin Receptor Blockers   . Lisinopril Other (See Comments)    Presumed cause of lip swelling  . Amlodipine Other (See Comments)    intolerant  . Codeine Sulfate     REACTION: headache  . Crestor [Rosuvastatin Calcium]     Aches with 10mg  daily  . Ketorolac Tromethamine     REACTION: severe headache  . Omeprazole Other (See Comments)    Muscle aches per patient   . Pravastatin     aches  . Tessalon [Benzonatate] Other (See Comments)    headache    ROS Review of Systems    Objective:    Physical Exam  There were no vitals taken for this visit. Wt Readings from Last 3 Encounters:  05/09/19 239 lb (108.4 kg)  04/19/19 239 lb (108.4 kg)  09/02/18 234 lb (106.1 kg)     Health Maintenance Due  Topic Date Due  . HIV Screening  08/04/1979  . INFLUENZA VACCINE  03/18/2019     There are no preventive care reminders to display for this patient.  Lab Results  Component Value Date   TSH 1.690 05/15/2016   Lab Results  Component Value Date   WBC 5.8 05/09/2019   HGB 14.9 05/09/2019   HCT 42.1 05/09/2019   MCV 92.3 05/09/2019   PLT 166 05/09/2019   Lab Results  Component Value Date   NA 138 05/09/2019   K 3.6 05/09/2019   CO2 25 05/09/2019   GLUCOSE 101 (H) 05/09/2019   BUN 17 05/09/2019   CREATININE 0.59 (L) 05/09/2019   BILITOT 1.3 (H) 05/09/2019   ALKPHOS 44 05/09/2019   AST 29 05/09/2019   ALT 17 05/09/2019   PROT 6.2 (L) 05/09/2019   ALBUMIN 3.8 05/09/2019   CALCIUM 8.6 (L) 05/09/2019   ANIONGAP 8 05/09/2019   GFR 108.61 08/30/2018   Lab Results  Component Value Date   CHOL 230 (H) 04/19/2019   Lab Results  Component Value Date   HDL 54 04/19/2019   Lab Results  Component Value Date   LDLCALC 148 (H) 04/19/2019   Lab Results  Component Value Date   TRIG 154 (H) 04/19/2019   Lab Results  Component Value Date   CHOLHDL 4 08/30/2018   Lab Results  Component Value Date   HGBA1C 5.6 05/15/2016      Assessment & Plan:   Problem List Items Addressed This Visit    Ascending aorta dilation (HCC) - Primary   Relevant Orders   CT Angio Chest W/Cm &/Or Wo Cm      No orders of the defined types were placed in this encounter.   Follow-up: No follow-ups on file.    05/17/2016, CMA

## 2019-05-22 NOTE — Telephone Encounter (Signed)
Noted. Thanks.  I'll await the GI notes.  I cancelled the order.

## 2019-05-22 NOTE — Progress Notes (Signed)
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

## 2019-05-22 NOTE — Telephone Encounter (Signed)
Called the patient about the CT Angio Chest order. Patient says he has an ulcer and that is what has caused the chest pain. He is seeing Dr Allen Norris and has an appt scheduled. He said a Cardiologist at the hospital told him he didn't have to have a CT Angio test that he could have a less invasive test that would be better for him. He thinks it is an Echocardiogram that the  Dr was talking about. Right now he wants to take care of the ulcer that is causing him the problems and declines to have the CT Angio chest test.

## 2019-06-12 ENCOUNTER — Ambulatory Visit (INDEPENDENT_AMBULATORY_CARE_PROVIDER_SITE_OTHER): Payer: Managed Care, Other (non HMO) | Admitting: Gastroenterology

## 2019-06-12 ENCOUNTER — Other Ambulatory Visit: Payer: Self-pay

## 2019-06-12 ENCOUNTER — Encounter: Payer: Self-pay | Admitting: Gastroenterology

## 2019-06-12 VITALS — BP 108/73 | HR 70 | Temp 98.4°F | Ht 68.0 in | Wt 236.0 lb

## 2019-06-12 DIAGNOSIS — R1030 Lower abdominal pain, unspecified: Secondary | ICD-10-CM | POA: Diagnosis not present

## 2019-06-12 DIAGNOSIS — K5732 Diverticulitis of large intestine without perforation or abscess without bleeding: Secondary | ICD-10-CM

## 2019-06-12 NOTE — Progress Notes (Signed)
Gastroenterology Consultation  Referring Provider:     Joaquim Nam, MD Primary Care Physician:  Joaquim Nam, MD Primary Gastroenterologist:  Dr. Servando Snare     Reason for Consultation:     Diverticulitis        HPI:   Frank Strickland is a 53 y.o. y/o male referred for consultation & management of diverticulitis by Dr. Para March, Dwana Curd, MD.  This patient comes in today with a history of having a colonoscopy in 2014 without any mention of diverticulosis but did have a CT scan that did show left-sided diverticulosis.  The patient was seen by urology who had been seeing the patient due to lower abdominal pain.  The patient reports that his abdominal pain was suprapubic and radiated to both the right and left side.  The patient was started on a course of ciprofloxacin and told to follow-up with GI.  The patient states that he had abdominal pain fevers and what he thought was chills.  The patient also reports that at the time of his visit with the urologist he had a rectal exam and had a very painful prostate during the exam.  He is not sure that he was not suffering from prostatitis which she has had in the past.  The patient also reports that he had an upper endoscopy in 2014 that showed a duodenal ulcer.  He denies any upper abdominal pain at the present time.  The patient also endorses that he has stopped drinking alcohol and states that he thinks he had been drinking too much in the past.  There is no report of any black stools or bloody stools.  He also denies any fevers chills nausea or vomiting.  There is no report of any black stools or bloody stools.  Past Medical History:  Diagnosis Date  . Allergy, unspecified not elsewhere classified   . Anxiety   . Duodenal ulcer    2014  . Hyperlipidemia   . Hypertension     Past Surgical History:  Procedure Laterality Date  . KIDNEY STONE SURGERY  2002   X 2 cystoscopy with stent    Prior to Admission medications   Medication Sig Start  Date End Date Taking? Authorizing Provider  Cholecalciferol 1000 units TBDP Take 1 tablet by mouth daily. 07/01/16  Yes Joaquim Nam, MD  famotidine (PEPCID) 20 MG tablet Take 1 tablet (20 mg total) by mouth 2 (two) times daily. 05/09/19 07/08/19 Yes Miguel Aschoff., MD  FLUoxetine (PROZAC) 20 MG capsule TAKE ONE CAPSULE BY MOUTH EVERY DAY 12/22/18  Yes Joaquim Nam, MD  fluticasone [facility]) 50 MCG/ACT nasal spray Place 2 sprays into both nostrils daily. 07/07/17  Yes Fisher, Roselyn Bering, PA-C  hydrochlorothiazide (HYDRODIURIL) 25 MG tablet Take 1 tablet (25 mg total) by mouth daily. 07/03/18  Yes Joaquim Nam, MD  Multiple Vitamin (MULTIVITAMIN) tablet Take 1 tablet by mouth daily.   Yes [provider]  traMADol (ULTRAM) 50 MG tablet Take 1 tablet (50 mg total) by mouth every 8 (eight) hours as needed. 09/07/18  Yes Joaquim Nam, MD    Family History  Problem Relation Age of Onset  . Allergies Mother   . Cancer Father        lung-smoker  . Heart disease Sister   . Hypertension Sister      Social History   Tobacco Use  . Smoking status: Former Smoker    Types: Cigarettes  . Smokeless tobacco:  Never Used  . Tobacco comment: quit 1997  Substance Use Topics  . Alcohol use: Yes    Alcohol/week: 12.0 standard drinks    Types: 12 Cans of beer per week    Comment: Occasional  . Drug use: No    Allergies as of 06/12/2019 - Review Complete 06/12/2019  Allergen Reaction Noted  . Ace inhibitors  01/08/2016  . Angiotensin receptor blockers  01/08/2016  . Lisinopril Other (See Comments) 01/08/2016  . Amlodipine Other (See Comments) 01/16/2016  . Codeine sulfate  02/22/2007  . Crestor [rosuvastatin calcium]  09/02/2018  . Ketorolac tromethamine  02/23/2007  . Omeprazole Other (See Comments) 04/22/2018  . Pravastatin  12/27/2018  . Tessalon [benzonatate] Other (See Comments) 09/17/2015    Review of Systems:    All systems reviewed and negative except where noted  in HPI.   Physical Exam:  BP 108/73   Pulse 70   Temp 98.4 F (36.9 C) (Temporal)   Ht 5\' 8"  (1.727 m)   Wt 236 lb (107 kg)   BMI 35.88 kg/m  No LMP for male patient. General:   Alert,  Well-developed, well-nourished, pleasant and cooperative in NAD Head:  Normocephalic and atraumatic. Eyes:  Sclera clear, no icterus.   Conjunctiva pink. Ears:  Normal auditory acuity. Nose:  No deformity, discharge, or lesions. Mouth:  No deformity or lesions,oropharynx pink & moist. Neck:  Supple; no masses or thyromegaly. Lungs:  Respirations even and unlabored.  Clear throughout to auscultation.   No wheezes, crackles, or rhonchi. No acute distress. Heart:  Regular rate and rhythm; no murmurs, clicks, rubs, or gallops. Abdomen:  Normal bowel sounds.  No bruits.  Soft, non-tender and non-distended without masses, hepatosplenomegaly or hernias noted.  No guarding or rebound tenderness.  Negative Carnett sign.   Rectal:  Deferred.  Msk:  Symmetrical without gross deformities.  Good, equal movement & strength bilaterally. Pulses:  Normal pulses noted. Extremities:  No clubbing or edema.  No cyanosis. Neurologic:  Alert and oriented x3;  grossly normal neurologically. Skin:  Intact without significant lesions or rashes.  No jaundice. Lymph Nodes:  No significant cervical adenopathy. Psych:  Alert and cooperative. Normal mood and affect.  Imaging Studies: No results found.  Assessment and Plan:   Frank Strickland is a 53 y.o. y/o male who comes in today with a history of lower abdominal pain.  The patient is being treated with antibiotics for his suprapubic pain that radiates to both the right and left side.  The patient has not had a CT scan to confirm this being diverticulitis.  The patient has also been told that since it has been 6 years since his colonoscopy and now he has had his first episode of possible diverticulitis he should undergo a colonoscopy 6 weeks after he finishes antibiotics.  The  patient has also been told that if he should have the pain recur that he should be considered for a urgent CT scan to confirm diverticulitis.  The patient has been explained the plan and agrees with it.  He will be set up for a colonoscopy for 6 to 8 weeks from now.  This visit consisted of 40 minutes face to face contact with myself and at least 50% of this time was spent in counseling and education regarding diagnosis, treatment options, medication management, risks and benefits of treatment.  Lucilla Lame, MD. Marval Regal    Note: This dictation was prepared with Dragon dictation along with smaller phrase technology. Any transcriptional errors  that result from this process are unintentional.

## 2019-06-14 ENCOUNTER — Other Ambulatory Visit: Payer: Self-pay

## 2019-06-14 DIAGNOSIS — K5732 Diverticulitis of large intestine without perforation or abscess without bleeding: Secondary | ICD-10-CM

## 2019-06-25 ENCOUNTER — Other Ambulatory Visit: Payer: Self-pay | Admitting: Family Medicine

## 2019-06-26 MED ORDER — HYDROCHLOROTHIAZIDE 25 MG PO TABS: 25.0000 mg | ORAL_TABLET | Freq: Every day | ORAL | 1 refills | Status: DC

## 2019-06-26 NOTE — Telephone Encounter (Signed)
Patient called to follow up on refills. Patient needs Paroxetine filled today if possible. Also HCTZ medication he has enough until next refill but does not have any more refills authorized.  I advised patient I would check with Dr Damita Dunnings also on when he needs to come for follow up or CPE.  Patient wanted to let Dr Damita Dunnings know that he has been dealing with prostate infections and seen Dr Rogers Blocker in [place]. He is having a few tests done with their office including blood cultures and checking for anything cancerous.  Please review when patient needs to come in? LOV 09/02/2018

## 2019-06-26 NOTE — Telephone Encounter (Signed)
Patient advised.

## 2019-06-26 NOTE — Telephone Encounter (Signed)
rxs sent.  Reasonable for CPE in spring of 2021 when possible.  I'll await the urology notes.  Update me as needed in the meantime.  Thanks.

## 2019-07-06 ENCOUNTER — Other Ambulatory Visit: Payer: Self-pay

## 2019-07-06 ENCOUNTER — Encounter: Payer: Self-pay | Admitting: Family Medicine

## 2019-07-06 ENCOUNTER — Ambulatory Visit: Payer: Managed Care, Other (non HMO) | Admitting: Family Medicine

## 2019-07-06 ENCOUNTER — Telehealth: Payer: Self-pay

## 2019-07-06 VITALS — BP 128/82 | HR 69 | Temp 97.4°F | Ht 68.0 in | Wt 236.0 lb

## 2019-07-06 DIAGNOSIS — S61419A Laceration without foreign body of unspecified hand, initial encounter: Secondary | ICD-10-CM | POA: Diagnosis not present

## 2019-07-06 DIAGNOSIS — Z23 Encounter for immunization: Secondary | ICD-10-CM

## 2019-07-06 MED ORDER — AMOXICILLIN-POT CLAVULANATE 875-125 MG PO TABS: 1.0000 | ORAL_TABLET | Freq: Two times a day (BID) | ORAL | 0 refills | Status: DC

## 2019-07-06 NOTE — Telephone Encounter (Signed)
Noted. Thanks.

## 2019-07-06 NOTE — Progress Notes (Signed)
This visit occurred during the SARS-CoV-2 public health emergency.  Safety protocols were in place, including screening questions prior to the visit, additional usage of staff PPE, and extensive cleaning of exam room while observing appropriate contact time as indicated for disinfecting solutions.    He had field dressed a deer and cut his L and R hand yesterday.  Normal tendon function in B hands.  Normal sensation.  No FCNAVD.  He cleaned the sites on his hands extensively.  Here for follow-up today.  Discussed options  Meds, vitals, and allergies reviewed.   ROS: Per HPI unless specifically indicated in ROS section   nad ncat Normal inspection of the hands except for lacerations as described below.  Distally neurovascularly intact with normal radial pulses and normal capillary refill.  No tendon deficit on any of the fingers. 5 mm superficial laceration on the dorsal right second DIP. 14 mm superficial laceration near the dorsal left first IP joint.   8 mm superficial laceration on the palmar side of the left third finger, mid phalanx. None of the lacerations appear to invade deep tissue/joints. Wounds appear clean.

## 2019-07-06 NOTE — Telephone Encounter (Signed)
Mrs [facility] signed) left v/m and said pt has been bow hunting and cut both hands today and bled a lot but is not bleeding now. Also concerned if tetanus shot was due. Per pts immuniztion record pt had Td 11/16/2003 and Tdap pt reported on 08/17/2013. I spoke with pts wife and she said pt already has appt to see Dr Damita Dunnings today at 2 PM.

## 2019-07-06 NOTE — Patient Instructions (Signed)
Keep the area clean and start augmentin.  Update me as needed.  Tetanus and flu shot today.  Take care.  Glad to see you.

## 2019-07-09 DIAGNOSIS — S61419A Laceration without foreign body of unspecified hand, initial encounter: Secondary | ICD-10-CM | POA: Insufficient documentation

## 2019-07-09 NOTE — Assessment & Plan Note (Signed)
No concern for foreign body but he was field dressing a deer at the time.  Discussed options.  The wounds were already clean.  Covered with Band-Aids and Neosporin.  We splinted the right second finger to help keep the DIP laceration closed.  Reasonable to treat with Augmentin given his exposure at the time of the cuts.  No sign of tendon deficit and no need for x-ray at this point.  He will update me as needed.  Routine cautions given.  He agrees.

## 2019-08-01 ENCOUNTER — Telehealth: Payer: Self-pay | Admitting: Gastroenterology

## 2019-08-01 NOTE — Telephone Encounter (Signed)
Pt left vm regarding his Covid test on Friday he is having to schedule this as an apt he needs a number to call to schedule the Covi19 test apt  cb [phone]

## 2019-08-02 ENCOUNTER — Telehealth: Payer: Self-pay | Admitting: Gastroenterology

## 2019-08-02 NOTE — Telephone Encounter (Signed)
Colonoscopy cancelled.

## 2019-08-02 NOTE — Telephone Encounter (Signed)
Patient called & would like to cancel his colonoscopy scheduled for 08-07-19 due to financial reasons.

## 2019-08-02 NOTE — Telephone Encounter (Signed)
LVM to let patient know [facility] stated his COVID test is 08/03/19 (Tomorrow) arrive between 10:30am and 12:30pm.  Thanks Sharyn Lull

## 2019-08-03 ENCOUNTER — Other Ambulatory Visit: Admission: RE | Admit: 2019-08-03 | Payer: Managed Care, Other (non HMO) | Source: Ambulatory Visit

## 2019-08-07 ENCOUNTER — Encounter: Admission: RE | Payer: Self-pay | Source: Home / Self Care

## 2019-08-07 ENCOUNTER — Ambulatory Visit
Admission: RE | Admit: 2019-08-07 | Payer: Managed Care, Other (non HMO) | Source: Home / Self Care | Admitting: Gastroenterology

## 2019-08-07 SURGERY — COLONOSCOPY WITH PROPOFOL
Anesthesia: Choice

## 2019-10-19 ENCOUNTER — Other Ambulatory Visit: Payer: Self-pay | Admitting: Family Medicine

## 2019-10-19 ENCOUNTER — Other Ambulatory Visit: Payer: Self-pay

## 2019-10-19 ENCOUNTER — Other Ambulatory Visit (INDEPENDENT_AMBULATORY_CARE_PROVIDER_SITE_OTHER): Payer: Managed Care, Other (non HMO)

## 2019-10-19 DIAGNOSIS — I10 Essential (primary) hypertension: Secondary | ICD-10-CM

## 2019-10-19 LAB — COMPREHENSIVE METABOLIC PANEL
ALT: 15 U/L (ref 0–53)
AST: 23 U/L (ref 0–37)
Albumin: 4 g/dL (ref 3.5–5.2)
Alkaline Phosphatase: 56 U/L (ref 39–117)
BUN: 19 mg/dL (ref 6–23)
CO2: 30 mEq/L (ref 19–32)
Calcium: 8.9 mg/dL (ref 8.4–10.5)
Chloride: 103 mEq/L (ref 96–112)
Creatinine, Ser: 0.93 mg/dL (ref 0.40–1.50)
GFR: 84.29 mL/min (ref >60.00)
Glucose, Bld: 94 mg/dL (ref 70–99)
Potassium: 4.2 mEq/L (ref 3.5–5.1)
Sodium: 139 mEq/L (ref 135–145)
Total Bilirubin: 1 mg/dL (ref 0.2–1.2)
Total Protein: 6.3 g/dL (ref 6.0–8.3)

## 2019-10-19 LAB — LIPID PANEL
Cholesterol: 234 mg/dL — ABNORMAL HIGH (ref 0–200)
HDL: 48.9 mg/dL (ref >39.00)
LDL Cholesterol: 152 mg/dL — ABNORMAL HIGH (ref 0–99)
NonHDL: 185.16
Total CHOL/HDL Ratio: 5
Triglycerides: 166 mg/dL — ABNORMAL HIGH (ref 0.0–149.0)
VLDL: 33.2 mg/dL (ref 0.0–40.0)

## 2019-10-23 ENCOUNTER — Encounter: Payer: Self-pay | Admitting: Family Medicine

## 2019-10-23 ENCOUNTER — Other Ambulatory Visit: Payer: Self-pay

## 2019-10-23 ENCOUNTER — Ambulatory Visit (INDEPENDENT_AMBULATORY_CARE_PROVIDER_SITE_OTHER): Payer: Managed Care, Other (non HMO) | Admitting: Family Medicine

## 2019-10-23 VITALS — BP 128/76 | HR 62 | Temp 97.5°F | Ht 67.25 in | Wt 237.1 lb

## 2019-10-23 DIAGNOSIS — I10 Essential (primary) hypertension: Secondary | ICD-10-CM

## 2019-10-23 DIAGNOSIS — E785 Hyperlipidemia, unspecified: Secondary | ICD-10-CM

## 2019-10-23 DIAGNOSIS — Z7189 Other specified counseling: Secondary | ICD-10-CM

## 2019-10-23 DIAGNOSIS — F341 Dysthymic disorder: Secondary | ICD-10-CM

## 2019-10-23 DIAGNOSIS — Z0000 Encounter for general adult medical examination without abnormal findings: Secondary | ICD-10-CM

## 2019-10-23 DIAGNOSIS — M25569 Pain in unspecified knee: Secondary | ICD-10-CM

## 2019-10-23 DIAGNOSIS — R911 Solitary pulmonary nodule: Secondary | ICD-10-CM

## 2019-10-23 MED ORDER — FLUOXETINE HCL 20 MG PO CAPS: 20.0000 mg | ORAL_CAPSULE | Freq: Every day | ORAL | 3 refills | Status: DC

## 2019-10-23 MED ORDER — HYDROCHLOROTHIAZIDE 25 MG PO TABS: 25.0000 mg | ORAL_TABLET | Freq: Every day | ORAL | 3 refills | Status: DC

## 2019-10-23 NOTE — Patient Instructions (Signed)
Thanks for your effort.  Don't change your meds for now.  If you want me to refer you to the lipid clinic to see about other meds, then let me know.  I think it would make sense to talk with them.  Take care.  Glad to see you.

## 2019-10-23 NOTE — Progress Notes (Signed)
This visit occurred during the SARS-CoV-2 public health emergency.  Safety protocols were in place, including screening questions prior to the visit, additional usage of staff PPE, and extensive cleaning of exam room while observing appropriate contact time as indicated for disinfecting solutions.  CPE- See plan.  Routine anticipatory guidance given to patient.  See health maintenance.  The possibility exists that previously documented standard health maintenance information may have been brought forward from a previous encounter into this note.  If needed, that same information has been updated to reflect the current situation based on today's encounter.    Tetanus shot done 2020 Flu done 2020 PNA not due d/w pt.  Shingles not due d/w pt.  covid vaccine d/w pt.   Living will d/w pt. Would have his wife designated if patient were incapacitated.  Prostate cancer screening and PSA options (with potential risks and benefits of testing vs not testing) were discussed along with recent recs/guidelines.  He declined testing PSA at this point. Colonoscopy 2014 Diet and exercise d/w pt. Labs d/w pt.  HAV and HBV vaccine prev done through work.    H/o lung nodules d/w pt.  Prev report with stable nodules bilaterally. No further follow-up is necessary. Resolution of previously seen left lower lobe ground-glass infiltrates.  Hypertension:    Using medication without problems or lightheadedness:yes  Chest pain with exertion:no Edema:no Short of breath:no He is working on diet and exercise.  Statin intolerant.  He had cardiology follow up in the meantime.   Mood d/w pt.  No ADE on med.  Compliant. Mood is good.  No SI/HI.    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  I thanked him for his effort.  PMH and SH reviewed  Meds, vitals, and allergies reviewed.   ROS: Per HPI.  Unless specifically indicated  otherwise in HPI, the patient denies:  General: fever. Eyes: acute vision changes ENT: sore throat Cardiovascular: chest pain Respiratory: SOB GI: vomiting GU: dysuria Musculoskeletal: acute back pain Derm: acute rash Neuro: acute motor dysfunction Psych: worsening mood Endocrine: polydipsia Heme: bleeding Allergy: hayfever  GEN: nad, alert and oriented HEENT: ncat NECK: supple w/o LA CV: rrr. PULM: ctab, no inc wob ABD: soft, +bs EXT: no edema SKIN: no acute rash

## 2019-10-25 DIAGNOSIS — M25569 Pain in unspecified knee: Secondary | ICD-10-CM | POA: Insufficient documentation

## 2019-10-25 NOTE — Assessment & Plan Note (Signed)
Tetanus shot done 2020 Flu done 2020 PNA not due d/w pt.  Shingles not due d/w pt.  covid vaccine d/w pt.   Living will d/w pt. Would have his wife designated if patient were incapacitated.  Prostate cancer screening and PSA options (with potential risks and benefits of testing vs not testing) were discussed along with recent recs/guidelines.  He declined testing PSA at this point. Colonoscopy 2014 Diet and exercise d/w pt. Labs d/w pt.  HAV and HBV vaccine prev done through work.

## 2019-10-25 NOTE — Assessment & Plan Note (Signed)
We talked about his lipids and possible referral to the lipid clinic for nonstatin at treatment.  I encouraged him to consider this.  He will think about it and let me know if he wants to go.

## 2019-10-25 NOTE — Assessment & Plan Note (Signed)
Mood d/w pt.  No ADE on med.  Compliant. Mood is good.  No SI/HI.

## 2019-10-25 NOTE — Assessment & Plan Note (Signed)
H/o lung nodules d/w pt.  Prev report with stable nodules bilaterally. No further follow-up is necessary. Resolution of previously seen left lower lobe ground-glass infiltrates.

## 2019-10-25 NOTE — Assessment & Plan Note (Signed)
Living will d/w pt.   Would have his wife designated if patient were incapacitated.  

## 2019-10-25 NOTE — Assessment & Plan Note (Signed)
He is putting up with knee pain at baseline, worse "when I overdo it."  He will update me as needed.

## 2019-10-25 NOTE — Assessment & Plan Note (Signed)
He is working on diet and exercise.  Statin intolerant.  He had cardiology follow up in the meantime.  Continue as is for now.  He agrees.

## 2019-11-08 ENCOUNTER — Encounter: Payer: Self-pay | Admitting: Family Medicine

## 2019-11-08 ENCOUNTER — Ambulatory Visit: Payer: Managed Care, Other (non HMO) | Admitting: Family Medicine

## 2019-11-08 ENCOUNTER — Other Ambulatory Visit: Payer: Self-pay

## 2019-11-08 VITALS — BP 120/78 | HR 61 | Temp 98.2°F | Ht 67.25 in | Wt 237.8 lb

## 2019-11-08 DIAGNOSIS — M1712 Unilateral primary osteoarthritis, left knee: Secondary | ICD-10-CM | POA: Diagnosis not present

## 2019-11-08 MED ORDER — METHYLPREDNISOLONE ACETATE 40 MG/ML IJ SUSP
80.0000 mg | Freq: Once | INTRAMUSCULAR | Status: AC
  Administered 2019-11-08: 80 mg via INTRA_ARTICULAR

## 2019-11-08 NOTE — Progress Notes (Signed)
Frank Sedore T. Karnell Vanderloop, MD Primary [facility] and [place] [facility] [place], [address] Phone: [phone]  FAX: [phone]  Frank Strickland - 54 y.o. male  MRN [id number]  Date of Birth: 14-Oct-1965  Visit Date: 11/08/2019  PCP: Frank Ghent, MD  Referred by: Frank Ghent, MD  Chief Complaint  Patient presents with  . Knee Pain    Left    This visit occurred during the SARS-CoV-2 public health emergency.  Safety protocols were in place, including screening questions prior to the visit, additional usage of staff PPE, and extensive cleaning of exam room while observing appropriate contact time as indicated for disinfecting solutions.   Subjective:   Frank Strickland is a 55 y.o. very pleasant male patient with Body mass index is 36.96 kg/m. who presents with the following:  L sided knee pain.  Had some L knee arthroscopy 2 years ago done by Frank Strickland.  About a month afterwards he was lifting a door and felt some pain and a pop in his knee.  Subsequently he did do a Synvisc 3 injection series.  He did think this helped somewhat.    He still does have some persistent aching and is gotten a lot worse in the left week or so.  He did have an effusion that was quite significant up until today.  He still has pain on the medial greater than lateral joint lines.  Not having any mechanical symptoms but he does know he has a deep ache at times, particular when he uses his knee more.   Review of Systems is noted in the HPI, as appropriate   Objective:   BP 120/78   Pulse 61   Temp 98.2 F (36.8 C) (Temporal)   Ht 5' 7.25" (1.708 m)   Wt 237 lb 12 oz (107.8 kg)   SpO2 95%   BMI 36.96 kg/m   GEN: No acute distress; alert,appropriate. PULM: Breathing comfortably in no respiratory distress PSYCH: Normally interactive.   Left knee: Full extension flexion to 120.  Mild effusion.  Stable to varus and valgus stress.  Lachman  is negative.  PCL is intact.  Medial joint line tenderness greater than lateral joint line tenderness.  Flexion pinch causes pain as well as McMurray's, but no mechanical symptoms.  Bounce home is negative.  Radiology: No results found.  Assessment and Plan:     ICD-10-CM   1. Primary localized osteoarthritis of left knee  M17.12 methylPREDNISolone acetate (DEPO-MEDROL) injection 80 mg   Knee arthritis.  Proven on arthroscopy.  Symptomatic care conservative care.  Continue with icing, brace for the next 2 weeks.  Oral anti-inflammatories.  Intra-articular injection today.  Aspiration/Injection Procedure Note BRESLIN HEMANN 1966-01-03 Date of procedure: 11/08/2019  Procedure: Large Joint Aspiration / Injection of Knee, L Indications: Pain  Procedure Details Patient verbally consented to procedure. Risks (including potential rare risk of infection), benefits, and alternatives explained. Sterilely prepped with Betadine x 3. Ethyl cholride used for anesthesia. 8 cc Lidocaine 1% mixed with 2 mL Depo-Medrol 40 mg injected using the anteromedial approach without difficulty. No complications with procedure and tolerated well. Patient had decreased pain post-injection. Medication: 2 mL of Depo-Medrol 40 mg, equaling Depo-Medrol 80 mg total   Patient Instructions  Alleve 2 tabs by mouth two times a day over the counter: Take at least for 2 - 3 weeks. This is equal to a prescripton strength dose [facility]  CHEAPER EQUIVALENT IS NAPROXEN SODIUM)   Ice knee after work  Knee brace for the next 2 weeks    Follow-up: No follow-ups on file.  Meds ordered this encounter  Medications  . methylPREDNISolone acetate (DEPO-MEDROL) injection 80 mg   There are no discontinued medications. No orders of the defined types were placed in this encounter.   Signed,  Frank Strickland. Frank Desouza, MD   Outpatient Encounter Medications as of 11/08/2019  Medication Sig  . Cholecalciferol 1000 units TBDP Take 1  tablet by mouth daily.  Marland Kitchen FLUoxetine (PROZAC) 20 MG capsule Take 1 capsule (20 mg total) by mouth daily.  . fluticasone (FLONASE) 50 MCG/ACT nasal spray Place 2 sprays into both nostrils daily.  . hydrochlorothiazide (HYDRODIURIL) 25 MG tablet Take 1 tablet (25 mg total) by mouth daily.  . Multiple Vitamin (MULTIVITAMIN) tablet Take 1 tablet by mouth daily.  . famotidine (PEPCID) 20 MG tablet Take 1 tablet (20 mg total) by mouth 2 (two) times daily.  . [EXPIRED] methylPREDNISolone acetate (DEPO-MEDROL) injection 80 mg    No facility-administered encounter medications on file as of 11/08/2019.

## 2019-11-08 NOTE — Patient Instructions (Signed)
Alleve 2 tabs by mouth two times a day over the counter: Take at least for 2 - 3 weeks. This is equal to a prescripton strength dose (GENERIC CHEAPER EQUIVALENT IS NAPROXEN SODIUM)   Ice knee after work  Knee brace for the next 2 weeks

## 2019-12-16 ENCOUNTER — Emergency Department
Admission: EM | Admit: 2019-12-16 | Discharge: 2019-12-16 | Disposition: A | Discharge status: Home / Self Care | Payer: Managed Care, Other (non HMO) | Attending: Emergency Medicine | Admitting: Emergency Medicine

## 2019-12-16 ENCOUNTER — Emergency Department: Payer: Managed Care, Other (non HMO)

## 2019-12-16 ENCOUNTER — Other Ambulatory Visit: Payer: Self-pay

## 2019-12-16 DIAGNOSIS — K5732 Diverticulitis of large intestine without perforation or abscess without bleeding: Secondary | ICD-10-CM | POA: Diagnosis not present

## 2019-12-16 DIAGNOSIS — R350 Frequency of micturition: Secondary | ICD-10-CM | POA: Diagnosis not present

## 2019-12-16 DIAGNOSIS — R1030 Lower abdominal pain, unspecified: Secondary | ICD-10-CM | POA: Diagnosis present

## 2019-12-16 DIAGNOSIS — I10 Essential (primary) hypertension: Secondary | ICD-10-CM | POA: Diagnosis not present

## 2019-12-16 DIAGNOSIS — Z87891 Personal history of nicotine dependence: Secondary | ICD-10-CM | POA: Insufficient documentation

## 2019-12-16 DIAGNOSIS — Z79899 Other long term (current) drug therapy: Secondary | ICD-10-CM | POA: Diagnosis not present

## 2019-12-16 DIAGNOSIS — M545 Low back pain: Secondary | ICD-10-CM | POA: Insufficient documentation

## 2019-12-16 LAB — URINALYSIS, COMPLETE (UACMP) WITH MICROSCOPIC
Bacteria, UA: NONE SEEN
Bilirubin Urine: NEGATIVE
Glucose, UA: NEGATIVE mg/dL
Ketones, ur: NEGATIVE mg/dL
Leukocytes,Ua: NEGATIVE
Nitrite: NEGATIVE
Protein, ur: NEGATIVE mg/dL
Specific Gravity, Urine: 1.019 (ref 1.005–1.030)
Squamous Epithelial / HPF: NONE SEEN (ref 0–5)
pH: 6 (ref 5.0–8.0)

## 2019-12-16 LAB — BASIC METABOLIC PANEL
Anion gap: 8 (ref 5–15)
BUN: 16 mg/dL (ref 6–20)
CO2: 23 mmol/L (ref 22–32)
Calcium: 8.2 mg/dL — ABNORMAL LOW (ref 8.9–10.3)
Chloride: 105 mmol/L (ref 98–111)
Creatinine, Ser: 0.61 mg/dL (ref 0.61–1.24)
GFR calc Af Amer: >60 mL/min (ref >60)
GFR calc non Af Amer: >60 mL/min (ref >60)
Glucose, Bld: 104 mg/dL — ABNORMAL HIGH (ref 70–99)
Potassium: 3.7 mmol/L (ref 3.5–5.1)
Sodium: 136 mmol/L (ref 135–145)

## 2019-12-16 LAB — CBC
HCT: 40 % (ref 39.0–52.0)
Hemoglobin: 14.1 g/dL (ref 13.0–17.0)
MCH: 33.1 pg (ref 26.0–34.0)
MCHC: 35.3 g/dL (ref 30.0–36.0)
MCV: 93.9 fL (ref 80.0–100.0)
Platelets: 148 10*3/uL — ABNORMAL LOW (ref 150–400)
RBC: 4.26 MIL/uL (ref 4.22–5.81)
RDW: 11.1 % — ABNORMAL LOW (ref 11.5–15.5)
WBC: 11.3 10*3/uL — ABNORMAL HIGH (ref 4.0–10.5)
nRBC: 0 % (ref 0.0–0.2)

## 2019-12-16 LAB — LIPASE, BLOOD: Lipase: 21 U/L (ref 11–51)

## 2019-12-16 MED ORDER — SODIUM CHLORIDE 0.9 % IV BOLUS
1000.0000 mL | Freq: Once | INTRAVENOUS | Status: AC
  Administered 2019-12-16: 12:00:00 1000 mL via INTRAVENOUS

## 2019-12-16 MED ORDER — HYDROCODONE-ACETAMINOPHEN 5-325 MG PO TABS: 1.0000 | ORAL_TABLET | Freq: Four times a day (QID) | ORAL | 0 refills | Status: DC | PRN

## 2019-12-16 MED ORDER — HYDROCODONE-ACETAMINOPHEN 5-325 MG PO TABS
1.0000 | ORAL_TABLET | Freq: Once | ORAL | Status: AC
  Administered 2019-12-16: 1 via ORAL
  Filled 2019-12-16: qty 1

## 2019-12-16 MED ORDER — CIPROFLOXACIN HCL 500 MG PO TABS
500.0000 mg | ORAL_TABLET | Freq: Two times a day (BID) | ORAL | 0 refills | Status: DC
Start: 2019-12-16 — End: 2019-12-28

## 2019-12-16 MED ORDER — ONDANSETRON 4 MG PO TBDP
4.0000 mg | ORAL_TABLET | Freq: Four times a day (QID) | ORAL | 0 refills | Status: DC | PRN
Start: 2019-12-16 — End: 2020-01-08

## 2019-12-16 MED ORDER — METRONIDAZOLE 500 MG PO TABS
500.0000 mg | ORAL_TABLET | Freq: Once | ORAL | Status: AC
  Administered 2019-12-16: 500 mg via ORAL
  Filled 2019-12-16: qty 1

## 2019-12-16 MED ORDER — ONDANSETRON HCL 4 MG/2ML IJ SOLN
4.0000 mg | Freq: Once | INTRAMUSCULAR | Status: AC
  Administered 2019-12-16: 4 mg via INTRAVENOUS
  Filled 2019-12-16: qty 2

## 2019-12-16 MED ORDER — CIPROFLOXACIN HCL 500 MG PO TABS
500.0000 mg | ORAL_TABLET | Freq: Once | ORAL | Status: AC
  Administered 2019-12-16: 500 mg via ORAL
  Filled 2019-12-16: qty 1

## 2019-12-16 MED ORDER — HYDROMORPHONE HCL 1 MG/ML IJ SOLN
1.0000 mg | INTRAMUSCULAR | Status: AC
  Administered 2019-12-16: 1 mg via INTRAVENOUS
  Filled 2019-12-16: qty 1

## 2019-12-16 MED ORDER — SODIUM CHLORIDE 0.9 % IV BOLUS
1000.0000 mL | Freq: Once | INTRAVENOUS | Status: AC
  Administered 2019-12-16: 1000 mL via INTRAVENOUS

## 2019-12-16 MED ORDER — METRONIDAZOLE 500 MG PO TABS
500.0000 mg | ORAL_TABLET | Freq: Three times a day (TID) | ORAL | 0 refills | Status: DC
Start: 2019-12-16 — End: 2019-12-28

## 2019-12-16 NOTE — ED Provider Notes (Signed)
[facility] Emergency Department Provider Note   ____________________________________________   First MD Initiated Contact with Patient 12/16/19 1146     (approximate)  I have reviewed the triage vital signs and the nursing notes.   HISTORY  Chief Complaint Flank Pain, Abdominal Pain, and Groin Pain    HPI Frank Strickland is a 54 y.o. male in for evaluation of right flank and lower abdominal pain  Reports yesterday started feeling some symptoms of pain in his right lower back, radiating through to his right lower groin and some towards the right testicle.  Pain is mostly in the right flank and back though.  Some nausea no vomiting.  One slightly loose stool.  No fevers or chills today but yesterday felt like he was getting hot and sweaty when he was having pain.  Reports a previous history of kidney stone also known on the right side, reports somewhat similar and he suspects this might be a kidney stone.  Noticed his urine seems slightly dark and also feels a frequent urge to urinate  No swelling in the groin or testicle.  Ports the pain does not feel like it starts in his groin or testicle it seems like it radiates from his back to the ED region  No chest pain.  No known Covid exposure.  No shortness of breath   Past Medical History:  Diagnosis Date  . Allergy, unspecified not elsewhere classified   . Anxiety   . Duodenal ulcer    2014  . Hyperlipidemia   . Hypertension     Patient Active Problem List   Diagnosis Date Noted  . Knee pain 10/25/2019  . 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 Coronary artery calcification seen on CAT scan 08/01/2018  . 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 Advance care planning 12/10/2017  . Wrist pain 10/06/2017  . Positive ANA (antinuclear antibody) 02/05/2017  . Rheumatoid factor positive 02/05/2017  . Positive anti-CCP test 02/05/2017  . Vitamin D deficiency 07/02/2016  . Erectile dysfunction  07/02/2016  . GERD (gastroesophageal reflux disease) 07/02/2016  . Prostatitis 01/17/2016  . Pulmonary nodule 09/18/2015  . Routine general medical examination at a health care facility 08/17/2012  . HLD (hyperlipidemia) 10/12/2007  . ESSENTIAL HYPERTENSION, BENIGN 10/10/2007  . ANXIETY DEPRESSION 02/22/2007  . Allergy 02/22/2007    Past Surgical History:  Procedure Laterality Date  . KIDNEY STONE SURGERY  2002   X 2 cystoscopy with stent    Prior to Admission medications   Medication Sig Start Date End Date Taking? Authorizing Provider  Cholecalciferol 1000 units TBDP Take 1 tablet by mouth daily. 07/01/16   Tonia Ghent, MD  ciprofloxacin (CIPRO) 500 MG tablet Take 1 tablet (500 mg total) by mouth 2 (two) times daily. 12/16/19   Delman Kitten, MD  famotidine (PEPCID) 20 MG tablet Take 1 tablet (20 mg total) by mouth 2 (two) times daily. 05/09/19 07/08/19  Lilia Pro., MD  FLUoxetine (PROZAC) 20 MG capsule Take 1 capsule (20 mg total) by mouth daily. 10/23/19   Tonia Ghent, MD  fluticasone Asencion Islam) 50 MCG/ACT nasal spray Place 2 sprays into both nostrils daily. 07/07/17   Fisher, Linden Dolin, PA-C  hydrochlorothiazide (HYDRODIURIL) 25 MG tablet Take 1 tablet (25 mg total) by mouth daily. 10/23/19   Tonia Ghent, MD  HYDROcodone-acetaminophen (NORCO/VICODIN) 5-325 MG tablet Take 1-2 tablets by mouth every 6 (six) hours as needed for moderate pain. 12/16/19   Delman Kitten, MD  metroNIDAZOLE (FLAGYL) 500 MG tablet Take 1 tablet (500 mg total) by mouth 3 (three) times daily. 12/16/19   Sharyn Creamer, MD  Multiple Vitamin (MULTIVITAMIN) tablet Take 1 tablet by mouth daily.    [provider]  ondansetron (ZOFRAN ODT) 4 MG disintegrating tablet Take 1 tablet (4 mg total) by mouth every 6 (six) hours as needed for nausea or vomiting. 12/16/19   Sharyn Creamer, MD    Allergies Ace inhibitors, Angiotensin receptor blockers, Lisinopril, Amlodipine, Codeine sulfate, Crestor [rosuvastatin  calcium], Ketorolac tromethamine, Omeprazole, Pravastatin, and Tessalon [benzonatate]  Family History  Problem Relation Age of Onset  . Allergies Mother   . Cancer Father        lung-smoker  . Heart disease Sister   . Hypertension Sister   . Colon cancer Neg Hx   . Prostate cancer Neg Hx     Social History Social History   Tobacco Use  . Smoking status: Former Smoker    Types: Cigarettes  . Smokeless tobacco: Never Used  . Tobacco comment: quit 1997  Substance Use Topics  . Alcohol use: Yes    Comment: Occasional  . Drug use: No    Review of Systems Constitutional: See HPI Eyes: No visual changes. ENT: No sore throat. Cardiovascular: Denies chest pain. Respiratory: Denies shortness of breath. Gastrointestinal: See HPI  genitourinary: Negative for dysuria.  No abnormal odor. Musculoskeletal: Some discomfort right flank not in the actual back Skin: Negative for rash. Neurological: Negative for headaches, areas of focal weakness or numbness.    ____________________________________________   PHYSICAL EXAM:  VITAL SIGNS: ED Triage Vitals  Enc Vitals Group     BP 12/16/19 1118 (!) 119/56     Pulse Rate 12/16/19 1118 82     Resp 12/16/19 1118 18     Temp 12/16/19 1118 98.5 F (36.9 C)     Temp Source 12/16/19 1118 Oral     SpO2 12/16/19 1118 97 %     Weight 12/16/19 1119 238 lb (108 kg)     Height 12/16/19 1119 5\' 8"  (1.727 m)     Head Circumference --      Peak Flow --      Pain Score 12/16/19 1119 8     Pain Loc --      Pain Edu? --      Excl. in GC? --     Constitutional: Alert and oriented.  Well appearing except he does appear in pain holding his hand over his right flank region appears quite uncomfortable Eyes: Conjunctivae are normal. Head: Atraumatic. Nose: No congestion/rhinnorhea. Mouth/Throat: Mucous membranes are moist. Neck: No stridor.  Cardiovascular: Normal rate, regular rhythm. Grossly normal heart sounds.  Good peripheral  circulation. Respiratory: Normal respiratory effort.  No retractions. Lungs CTAB. Gastrointestinal: Soft and nontender except for some discomfort in the suprapubic region, without peritonitis. No distention.  Perhaps very mild right CVA tenderness   no groin pain or masses.  Testicles descended nontender no swelling.  Some discomfort to palpation left lower quadrant without rebound or guarding as well. Musculoskeletal: No lower extremity tenderness nor edema. Neurologic:  Normal speech and language. No gross focal neurologic deficits are appreciated.  Skin:  Skin is warm, dry and intact. No rash noted. Psychiatric: Mood and affect are normal. Speech and behavior are normal.  ____________________________________________   LABS (all labs ordered are listed, but only abnormal results are displayed)  Labs Reviewed  URINALYSIS, COMPLETE (UACMP) WITH MICROSCOPIC - Abnormal; Notable for the following components:  Result Value   Color, Urine YELLOW (*)    APPearance CLEAR (*)    Hgb urine dipstick SMALL (*)    All other components within normal limits  BASIC METABOLIC PANEL - Abnormal; Notable for the following components:   Glucose, Bld 104 (*)    Calcium 8.2 (*)    All other components within normal limits  CBC - Abnormal; Notable for the following components:   WBC 11.3 (*)    RDW 11.1 (*)    Platelets 148 (*)    All other components within normal limits  URINE CULTURE  LIPASE, BLOOD   ____________________________________________  EKG   ____________________________________________  RADIOLOGY  CT Renal Stone Study  Result Date: 12/16/2019 CLINICAL DATA:  Pt states that he started having symptoms yesterday in his right flank area that is radiating to his rlq and into his right groin EXAM: CT ABDOMEN AND PELVIS WITHOUT CONTRAST TECHNIQUE: Multidetector CT imaging of the abdomen and pelvis was performed following the standard protocol without IV contrast. COMPARISON:  None.  FINDINGS: Lower chest: Stable 3 mm nodule in the right middle lobe. No acute finding. Somewhat limited evaluation of the abdominal viscera given lack of IV contrast. Hepatobiliary: No focal liver abnormality is seen. No gallstones, gallbladder wall thickening, or biliary dilatation. Pancreas: Unremarkable. No surrounding inflammatory changes. Spleen: Normal in size without focal abnormality. Adrenals/Urinary Tract: Adrenal glands are unremarkable. No hydronephrosis or renal calculi. There are multiple bilateral renal cysts, some of which are hyperdense consistent with proteinaceous or hemorrhagic material. The urinary bladder is unremarkable. Stomach/Bowel: Stomach is within normal limits. Appendix appears normal. There is significant bowel wall thickening and mesenteric fat stranding about the sigmoid colon associated with multiple diverticula. No significant pericolonic fluid collection the remainder of the bowel is normal in appearance. No distant free air. Vascular/Lymphatic: No significant vascular findings are present. No enlarged abdominal or pelvic lymph nodes. Reproductive: Prostate is unremarkable. Other: No abdominal wall hernia or abnormality. No abdominopelvic ascites. 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 IMPRESSION: Findings consistent with acute uncomplicated sigmoid diverticulitis. Electronically Signed   By: Emmaline Kluver M.D.   On: 12/16/2019 14:35    CT scan reviewed consistent with acute sigmoid diverticulitis ____________________________________________   PROCEDURES  Procedure(s) performed: None  Procedures  Critical Care performed: No  ____________________________________________   INITIAL IMPRESSION / ASSESSMENT AND PLAN / ED COURSE  Pertinent labs & imaging results that were available during my care of the patient were reviewed by me and considered in my medical decision making (see  chart for details).   Differential diagnosis includes but is not limited to, abdominal perforation, aortic dissection, cholecystitis, appendicitis, diverticulitis, colitis, esophagitis/gastritis, kidney stone, pyelonephritis, urinary tract infection, aortic aneurysm. All are considered in decision and treatment plan. Based upon the patient's presentation and risk factors, we will proceed with CT imaging.  Clinical history seem to suggest possible kidney stone, but given his reported feel like he had some possible fevers and loose stools associated as well differential would certainly include other causes we will proceed with imaging.    Clinical Course as of Dec 16 1606  Sat Dec 16, 2019  1330 Patient reports pain much improved, resting comfortably   [MQ]    Clinical Course User Index [MQ] Sharyn Creamer, MD    Reviewed with this patient, he does have a history of diverticulitis in the past.  I suspect given his clinical history that likely  this is mild diverticulitis.  Discussed with him treatment plan with prescription pain medicine, nausea medicine, Cipro and Flagyl.  He is in agreement with the plan.  Specifically discussed potential risk factors with ciprofloxacin including joint or ligamentous injuries, if he is to develop any signs or symptoms this he will discontinue incontinence PCP  He will be not be driving or working with dangerous equipment or dangerous areas while taking hydrocodone.  Return precautions and treatment recommendations and follow-up discussed with the patient who is agreeable with the plan.  Advised will be able to follow-up Dr. Para March  ____________________________________________   FINAL CLINICAL IMPRESSION(S) / ED DIAGNOSES  Final diagnoses:  Sigmoid diverticulitis        Note:  This document was prepared using Dragon voice recognition software and may include unintentional dictation errors       Sharyn Creamer, MD 12/16/19 1608

## 2019-12-16 NOTE — Discharge Instructions (Signed)
No driving or working in dangerous areas while taking hydrocodone.

## 2019-12-16 NOTE — ED Triage Notes (Signed)
Pt states that he started having symptoms yesterday in his right flank area that is radiating to his rlq and into his right groin, pt states that he is having difficulty urinating as well, pt had some old medications for kidney stones in the past that he took last pm and today as well as 2 tylenol

## 2019-12-17 LAB — URINE CULTURE: Culture: NO GROWTH

## 2019-12-28 ENCOUNTER — Other Ambulatory Visit: Payer: Self-pay

## 2019-12-28 ENCOUNTER — Ambulatory Visit: Payer: Managed Care, Other (non HMO) | Admitting: Family Medicine

## 2019-12-28 ENCOUNTER — Encounter: Payer: Self-pay | Admitting: Family Medicine

## 2019-12-28 DIAGNOSIS — N419 Inflammatory disease of prostate, unspecified: Secondary | ICD-10-CM

## 2019-12-28 MED ORDER — SULFAMETHOXAZOLE-TRIMETHOPRIM 800-160 MG PO TABS: 1.0000 | ORAL_TABLET | Freq: Two times a day (BID) | ORAL | 0 refills | Status: DC

## 2019-12-28 NOTE — Patient Instructions (Signed)
Drink plenty of water and start septra.  Update me if not better or if worse.  Take care.  Glad to see you.

## 2019-12-28 NOTE — Progress Notes (Signed)
This visit occurred during the SARS-CoV-2 public health emergency.  Safety protocols were in place, including screening questions prior to the visit, additional usage of staff PPE, and extensive cleaning of exam room while observing appropriate contact time as indicated for disinfecting solutions.  ER f/u after diverticulitis.  "It felt like it was swelled up and I couldn't lean forward."  R sided abd pain and lower back pain.  He had a fever and sweats.  Treated with cipro and flagyl, 10 days worth of both.    Abd pain is better.  He "feels like I'm sitting on a softball" ie having perineal irritation and prostate area pain.  H/o prostatitis in the past.  This feels similar to previous episodes of prostatitis.  Discussed options.  He has increased LUTS with more nocturia.  Bowel movements are normal now.  No fevers.  Meds, vitals, and allergies reviewed.   ROS: Per HPI unless specifically indicated in ROS section   GEN: nad, alert and oriented HEENT: ncat NECK: supple w/o LA CV: rrr.  PULM: ctab, no inc wob ABD: soft, +bs, nontender EXT: no edema SKIN: no acute rash DRE deferred.

## 2020-01-02 NOTE — Assessment & Plan Note (Signed)
Likely previous diverticulitis is resolved but likely with residual prostatitis, as this feels similar to previous.  Discussed options. Drink plenty of water and start septra.  Update me if not better or if worse.  He agrees with plan.  We discussed routine duration of treatment for prostatitis.

## 2020-01-04 ENCOUNTER — Encounter: Payer: Self-pay | Admitting: Emergency Medicine

## 2020-01-04 ENCOUNTER — Telehealth: Payer: Self-pay

## 2020-01-04 ENCOUNTER — Emergency Department: Payer: Managed Care, Other (non HMO)

## 2020-01-04 ENCOUNTER — Other Ambulatory Visit: Payer: Self-pay

## 2020-01-04 ENCOUNTER — Emergency Department
Admission: EM | Admit: 2020-01-04 | Discharge: 2020-01-04 | Disposition: A | Discharge status: Home / Self Care | Payer: Managed Care, Other (non HMO) | Attending: Emergency Medicine | Admitting: Emergency Medicine

## 2020-01-04 DIAGNOSIS — Z87891 Personal history of nicotine dependence: Secondary | ICD-10-CM | POA: Insufficient documentation

## 2020-01-04 DIAGNOSIS — I10 Essential (primary) hypertension: Secondary | ICD-10-CM | POA: Insufficient documentation

## 2020-01-04 DIAGNOSIS — K5792 Diverticulitis of intestine, part unspecified, without perforation or abscess without bleeding: Secondary | ICD-10-CM

## 2020-01-04 DIAGNOSIS — I2510 Atherosclerotic heart disease of native coronary artery without angina pectoris: Secondary | ICD-10-CM | POA: Diagnosis not present

## 2020-01-04 DIAGNOSIS — M069 Rheumatoid arthritis, unspecified: Secondary | ICD-10-CM | POA: Diagnosis not present

## 2020-01-04 DIAGNOSIS — R1030 Lower abdominal pain, unspecified: Secondary | ICD-10-CM | POA: Diagnosis present

## 2020-01-04 DIAGNOSIS — Z79899 Other long term (current) drug therapy: Secondary | ICD-10-CM | POA: Diagnosis not present

## 2020-01-04 LAB — CBC
HCT: 41.2 % (ref 39.0–52.0)
Hemoglobin: 14.5 g/dL (ref 13.0–17.0)
MCH: 33 pg (ref 26.0–34.0)
MCHC: 35.2 g/dL (ref 30.0–36.0)
MCV: 93.8 fL (ref 80.0–100.0)
Platelets: 190 10*3/uL (ref 150–400)
RBC: 4.39 MIL/uL (ref 4.22–5.81)
RDW: 11 % — ABNORMAL LOW (ref 11.5–15.5)
WBC: 11.9 10*3/uL — ABNORMAL HIGH (ref 4.0–10.5)
nRBC: 0 % (ref 0.0–0.2)

## 2020-01-04 LAB — COMPREHENSIVE METABOLIC PANEL
ALT: 17 U/L (ref 0–44)
AST: 23 U/L (ref 15–41)
Albumin: 4.1 g/dL (ref 3.5–5.0)
Alkaline Phosphatase: 50 U/L (ref 38–126)
Anion gap: 8 (ref 5–15)
BUN: 15 mg/dL (ref 6–20)
CO2: 22 mmol/L (ref 22–32)
Calcium: 8.9 mg/dL (ref 8.9–10.3)
Chloride: 104 mmol/L (ref 98–111)
Creatinine, Ser: 0.73 mg/dL (ref 0.61–1.24)
GFR calc Af Amer: >60 mL/min (ref >60)
GFR calc non Af Amer: >60 mL/min (ref >60)
Glucose, Bld: 87 mg/dL (ref 70–99)
Potassium: 4.4 mmol/L (ref 3.5–5.1)
Sodium: 134 mmol/L — ABNORMAL LOW (ref 135–145)
Total Bilirubin: 1.4 mg/dL — ABNORMAL HIGH (ref 0.3–1.2)
Total Protein: 6.7 g/dL (ref 6.5–8.1)

## 2020-01-04 LAB — URINALYSIS, COMPLETE (UACMP) WITH MICROSCOPIC
Bacteria, UA: NONE SEEN
Bilirubin Urine: NEGATIVE
Glucose, UA: NEGATIVE mg/dL
Hgb urine dipstick: NEGATIVE
Ketones, ur: NEGATIVE mg/dL
Leukocytes,Ua: NEGATIVE
Nitrite: NEGATIVE
Protein, ur: NEGATIVE mg/dL
Specific Gravity, Urine: 1.016 (ref 1.005–1.030)
Squamous Epithelial / HPF: NONE SEEN (ref 0–5)
pH: 5 (ref 5.0–8.0)

## 2020-01-04 LAB — LIPASE, BLOOD: Lipase: 27 U/L (ref 11–51)

## 2020-01-04 MED ORDER — AMOXICILLIN-POT CLAVULANATE 875-125 MG PO TABS
1.0000 | ORAL_TABLET | Freq: Two times a day (BID) | ORAL | 0 refills | Status: DC
Start: 2020-01-04 — End: 2020-01-08

## 2020-01-04 MED ORDER — IOHEXOL 300 MG/ML  SOLN
100.0000 mL | Freq: Once | INTRAMUSCULAR | Status: AC | PRN
  Administered 2020-01-04: 100 mL via INTRAVENOUS
  Filled 2020-01-04: qty 100

## 2020-01-04 MED ORDER — HYDROCODONE-ACETAMINOPHEN 5-325 MG PO TABS: 1.0000 | ORAL_TABLET | ORAL | 0 refills | Status: DC | PRN

## 2020-01-04 MED ORDER — MORPHINE SULFATE (PF) 4 MG/ML IV SOLN
4.0000 mg | Freq: Once | INTRAVENOUS | Status: AC
  Administered 2020-01-04: 4 mg via INTRAVENOUS
  Filled 2020-01-04: qty 1

## 2020-01-04 MED ORDER — ONDANSETRON HCL 4 MG/2ML IJ SOLN
4.0000 mg | Freq: Once | INTRAMUSCULAR | Status: AC
  Administered 2020-01-04: 4 mg via INTRAVENOUS
  Filled 2020-01-04: qty 2

## 2020-01-04 NOTE — ED Triage Notes (Signed)
Patient reports abdominal pain and cramping, worsening since yesterday. Patient states he had severe diarrhea yesterday. Denies diarrhea today but complaining of continued RLQ abdominal pain and back pain. Patient reports he was seen recently for similar symptoms and found out his "colon was swollen". Reports he was started on abx and was compliant with those.

## 2020-01-04 NOTE — Telephone Encounter (Signed)
Per chart review tab pt is presently at [facility] ED. 

## 2020-01-04 NOTE — ED Provider Notes (Signed)
[facility] Emergency Department Provider Note   ____________________________________________    I have reviewed the triage vital signs and the nursing notes.   HISTORY  Chief Complaint Abdominal Pain     HPI Frank Strickland is a 54 y.o. male who presents with complaints of lower abdominal pain which is moderate to severe.  He reports that he was treated for diverticulitis at the beginning of this month, he finished his antibiotics and felt much better.  However he had lower abdominal pain again and saw his PCP who thought that he may have prostatitis, he was treated with Bactrim for that with some mild improvement.  Recently he has developed more severe pain which feels the same as his diverticulitis.  He denies fevers or chills.  No nausea or vomiting.  Is not take anything for this besides the antibiotics that he was prescribed.   Past Medical History:  Diagnosis Date  . Allergy, unspecified not elsewhere classified   . Anxiety   . Duodenal ulcer    2014  . Hyperlipidemia   . Hypertension     Patient Active Problem List   Diagnosis Date Noted  . Knee pain 10/25/2019  . 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 Coronary artery calcification seen on CAT scan 08/01/2018  . 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 Advance care planning 12/10/2017  . Wrist pain 10/06/2017  . Positive ANA (antinuclear antibody) 02/05/2017  . Rheumatoid factor positive 02/05/2017  . Positive anti-CCP test 02/05/2017  . Vitamin D deficiency 07/02/2016  . Erectile dysfunction 07/02/2016  . GERD (gastroesophageal reflux disease) 07/02/2016  . Prostatitis 01/17/2016  . Pulmonary nodule 09/18/2015  . Routine general medical examination at a health care facility 08/17/2012  . HLD (hyperlipidemia) 10/12/2007  . ESSENTIAL HYPERTENSION, BENIGN 10/10/2007  . ANXIETY DEPRESSION 02/22/2007  . Allergy 02/22/2007    Past Surgical History:  Procedure Laterality Date  .  KIDNEY STONE SURGERY  2002   X 2 cystoscopy with stent    Prior to Admission medications   Medication Sig Start Date End Date Taking? Authorizing Provider  amoxicillin-clavulanate (AUGMENTIN) 875-125 MG tablet Take 1 tablet by mouth 2 (two) times daily for 7 days. 01/04/20 01/11/20  Lavonia Drafts, MD  Cholecalciferol 1000 units TBDP Take 1 tablet by mouth daily. 07/01/16   Tonia Ghent, MD  famotidine (PEPCID) 20 MG tablet Take 1 tablet (20 mg total) by mouth 2 (two) times daily. 05/09/19 12/28/19  Lilia Pro., MD  FLUoxetine (PROZAC) 20 MG capsule Take 1 capsule (20 mg total) by mouth daily. 10/23/19   Tonia Ghent, MD  fluticasone Asencion Islam) 50 MCG/ACT nasal spray Place 2 sprays into both nostrils daily. 07/07/17   Fisher, Linden Dolin, PA-C  hydrochlorothiazide (HYDRODIURIL) 25 MG tablet Take 1 tablet (25 mg total) by mouth daily. 10/23/19   Tonia Ghent, MD  HYDROcodone-acetaminophen (NORCO/VICODIN) 5-325 MG tablet Take 1 tablet by mouth every 4 (four) hours as needed for moderate pain. 01/04/20   Lavonia Drafts, MD  Multiple Vitamin (MULTIVITAMIN) tablet Take 1 tablet by mouth daily.    [provider]  ondansetron (ZOFRAN ODT) 4 MG disintegrating tablet Take 1 tablet (4 mg total) by mouth every 6 (six) hours as needed for nausea or vomiting. 12/16/19   Delman Kitten, MD  sulfamethoxazole-trimethoprim (BACTRIM DS) 800-160 MG tablet Take 1 tablet by mouth 2 (two) times daily. 12/28/19   Tonia Ghent, MD     Allergies Ace inhibitors, Angiotensin  receptor blockers, Lisinopril, Amlodipine, Codeine sulfate, Crestor [rosuvastatin calcium], Ketorolac tromethamine, Omeprazole, Pravastatin, and Tessalon [benzonatate]  Family History  Problem Relation Age of Onset  . Allergies Mother   . Cancer Father        lung-smoker  . Heart disease Sister   . Hypertension Sister   . Colon cancer Neg Hx   . Prostate cancer Neg Hx     Social History Social History   Tobacco Use  . Smoking  status: Former Smoker    Types: Cigarettes  . Smokeless tobacco: Never Used  . Tobacco comment: quit 1997  Substance Use Topics  . Alcohol use: Yes    Comment: Occasional  . Drug use: No    Review of Systems  Constitutional: No fever/chills Eyes: No visual changes.  ENT: No sore throat. Cardiovascular: Denies chest pain. Respiratory: Denies shortness of breath. Gastrointestinal: As above Genitourinary: Negative for dysuria.  No hematuria Musculoskeletal: Negative for back pain. Skin: Negative for rash. Neurological: Negative for headaches or weakness   ____________________________________________   PHYSICAL EXAM:  VITAL SIGNS: ED Triage Vitals  Enc Vitals Group     BP 01/04/20 0923 126/77     Pulse Rate 01/04/20 0923 61     Resp 01/04/20 0923 16     Temp 01/04/20 0923 98.6 F (37 C)     Temp Source 01/04/20 0923 Oral     SpO2 01/04/20 0923 99 %     Weight 01/04/20 0933 107 kg (235 lb 14.3 oz)     Height 01/04/20 0933 1.727 m (5\' 8" )     Head Circumference --      Peak Flow --      Pain Score 01/04/20 0933 7     Pain Loc --      Pain Edu? --      Excl. in GC? --     Constitutional: Alert and oriented.   Head: Atraumatic. Nose: No congestion/rhinnorhea. Mouth/Throat: Mucous membranes are moist.    Cardiovascular: Normal rate, regular rhythm. Grossly normal heart sounds.  Good peripheral circulation. Respiratory: Normal respiratory effort.  No retractions. Lungs CTAB. Gastrointestinal: Soft and nontender. No distention.  No CVA tenderness. Musculoskeletal:  Warm and well perfused Neurologic:  Normal speech and language. No gross focal neurologic deficits are appreciated.  Skin:  Skin is warm, dry and intact. No rash noted. Psychiatric: Mood and affect are normal. Speech and behavior are normal.  ____________________________________________   LABS (all labs ordered are listed, but only abnormal results are displayed)  Labs Reviewed  COMPREHENSIVE  METABOLIC PANEL - Abnormal; Notable for the following components:      Result Value   Sodium 134 (*)    Total Bilirubin 1.4 (*)    All other components within normal limits  CBC - Abnormal; Notable for the following components:   WBC 11.9 (*)    RDW 11.0 (*)    All other components within normal limits  URINALYSIS, COMPLETE (UACMP) WITH MICROSCOPIC - Abnormal; Notable for the following components:   Color, Urine YELLOW (*)    APPearance CLEAR (*)    All other components within normal limits  LIPASE, BLOOD   ____________________________________________  EKG  None ____________________________________________  RADIOLOGY  CT scan demonstrates mildly decreased inflammation ____________________________________________   PROCEDURES  Procedure(s) performed: No  Procedures   Critical Care performed: No ____________________________________________   INITIAL IMPRESSION / ASSESSMENT AND PLAN / ED COURSE  Pertinent labs & imaging results that were available during my care of the patient  were reviewed by me and considered in my medical decision making (see chart for details).  Patient presents with lower abdominal pain which he reports is similar to when he had diverticulitis earlier in the month.  Strongly suspect that he has recurrence of diverticulitis or perhaps is not fully healed.  I doubt the patient had prostatitis, I suspect he improved after initial antibiotics for diverticulitis but not fully and then had a mild recurrence.  He was on Bactrim for prostatitis which would potentially improved but not healed diverticulitis.  Obtain CT scan to make sure no abscess or rupture.  Gave IV morphine and IV Zofran  CT is reassuring, there continues to be evidence of active inflammation although somewhat decreased.  Will place the patient on Augmentin, have follow-up with surgery, return precautions discussed    ____________________________________________   FINAL CLINICAL  IMPRESSION(S) / ED DIAGNOSES  Final diagnoses:  Diverticulitis        Note:  This document was prepared using Dragon voice recognition software and may include unintentional dictation errors.   Jene Every, MD 01/04/20 1335

## 2020-01-04 NOTE — ED Notes (Signed)
Pt alert and oriented X 4, stable for discharge. RR even and unlabored, color WNL. Discussed discharge instructions and follow up when appropriate. Instructed to follow up with ER for any life threatening symptoms or concerns that patient or family of patient may have  

## 2020-01-04 NOTE — Telephone Encounter (Signed)
I saw the ER note.  Please get update on patient tomorrow.  Thanks.

## 2020-01-04 NOTE — Telephone Encounter (Signed)
[facility] - Client TELEPHONE ADVICE RECORD [facility] Patient Name: Frank Strickland Gender: Male DOB: Nov 11, 1965 Age: 54 Y 65 M 2 D Return Phone Number: [phone] (Primary) Address: City/State/Zip: [place] [address] Client [facility] - Client Client Site [facility] Physician Renford Dills - MD Contact Type Call Who Is Calling Patient / Member / Family / Caregiver Call Type Triage / Clinical Relationship To Patient Self Return Phone Number [phone] (Primary) Chief Complaint Abdominal Pain Reason for Call Symptomatic / Request for [place] states the patient is having sharp abdominal pains and was seen recently for diverticulitis. Caller states he is hurting in the front of the abdomin near his groin area wrapping around to his lower back and states he is was going to the bathroom alot but now he is not going at all. Caller states he is taking the medication prescribed but it is no longer helping. Translation No Nurse Assessment Nurse: Laurena Bering, RN, Helene Kelp Date/Time Eilene Ghazi Time): 01/04/2020 8:31:29 AM Confirm and document reason for call. If symptomatic, describe symptoms. ---Caller states the patient is having sharp abdominal pains and was seen recently for diverticulitis. Caller states he is hurting in the front of the abdominal near his groin area wrapping around to his lower back and states he is was going to the bathroom a lot but now he is not going at all. Caller states he is taking the medication prescribed but it is no longer helping. Abdomen is bloated. Frequent BM during night. Caller rates as 6/10. Was seen last week and was treated for inflamed prostate Has the patient had close contact with a person known or suspected to have the novel coronavirus illness OR traveled / lives in area with major community spread (including international travel)  in the last 14 days from the onset of symptoms? * If Asymptomatic, screen for exposure and travel within the last 14 days. ---No Does the patient have any new or worsening symptoms? ---Yes Will a triage be completed? ---Yes Related visit to physician within the last 2 weeks? ---Yes Does the PT have any chronic conditions? (i.e. diabetes, asthma, this includes High risk factors for pregnancy, etc.) ---Yes List chronic conditions. ---HTN, depression Is this a behavioral health or substance abuse call? ---No PLEASE NOTE: All timestamps contained within this report are represented as [place] Standard Time. CONFIDENTIALTY NOTICE: This fax transmission is intended only for the addressee. It contains information that is legally privileged, confidential or otherwise protected from use or disclosure. If you are not the intended recipient, you are strictly prohibited from reviewing, disclosing, copying using or disseminating any of this information or taking any action in reliance on or regarding this information. If you have received this fax in error, please notify us immediately by telephone so that we can arrange for its return to [place]. Phone: [phone], Toll-Free: [phone], Fax: [phone] Page: 2 of 2 Call Id: [id number] Guidelines Guideline Title Affirmed Question Affirmed Notes Nurse Date/Time Eilene Ghazi Time) Abdominal Pain - Male [1] MILD-MODERATE pain AND [2] constant AND [3] present > 2 hours Milton Ferguson 01/04/2020 8:34:01 AM Disp. Time Eilene Ghazi Time) Disposition Final User 01/04/2020 8:35:08 AM See HCP within 4 Hours (or PCP triage) Yes Laurena Bering, RN, [place] Artist Disagree/Comply Comply Caller Understands Yes PreDisposition Call Doctor Care Advice Given Per Guideline SEE HCP WITHIN 4 HOURS (OR PCP TRIAGE): NOTHING BY MOUTH: * Do not eat or drink anything for now.  REST: * Lie down. * Rest until seen. CALL BACK IF: * You become worse. CARE ADVICE given per Abdominal Pain,  Male (Adult) guideline. Comments User: Criss Rosales, RN Date/Time Lamount Cohen Time): 01/04/2020 8:41:13 AM No available appointments. Office instructed nurse to send to [facility] Referrals Warm transfer to backline

## 2020-01-05 ENCOUNTER — Encounter: Payer: Self-pay | Admitting: Surgery

## 2020-01-05 ENCOUNTER — Other Ambulatory Visit: Payer: Self-pay

## 2020-01-05 ENCOUNTER — Ambulatory Visit (INDEPENDENT_AMBULATORY_CARE_PROVIDER_SITE_OTHER): Payer: Managed Care, Other (non HMO) | Admitting: Surgery

## 2020-01-05 VITALS — BP 119/77 | HR 54 | Temp 97.3°F | Resp 12 | Ht 68.0 in | Wt 231.8 lb

## 2020-01-05 DIAGNOSIS — K5792 Diverticulitis of intestine, part unspecified, without perforation or abscess without bleeding: Secondary | ICD-10-CM

## 2020-01-05 MED ORDER — AMOXICILLIN-POT CLAVULANATE 875-125 MG PO TABS
1.0000 | ORAL_TABLET | Freq: Two times a day (BID) | ORAL | 0 refills | Status: AC
Start: 2020-01-05 — End: 2020-01-12

## 2020-01-05 NOTE — Patient Instructions (Addendum)
Please continue to finish the course of antibiotics that was prescribed to you by the ER. Pick up your prescription at your local pharmacy.   Start on a liquid diet over this weekend and on Monday you may slowly start eating solids.   We will see you for a follow up. See your appointment below.  Call the office if you have any questions or concerns.  Diverticulitis  Diverticulitis is infection or inflammation of small pouches (diverticula) in the colon that form due to a condition called diverticulosis. Diverticula can trap stool (feces) and bacteria, causing infection and inflammation. Diverticulitis may cause severe stomach pain and diarrhea. It may lead to tissue damage in the colon that causes bleeding. The diverticula may also burst (rupture) and cause infected stool to enter other areas of the abdomen. Complications of diverticulitis can include:  Bleeding.  Severe infection.  Severe pain.  Rupture (perforation) of the colon.  Blockage (obstruction) of the colon. What are the causes? This condition is caused by stool becoming trapped in the diverticula, which allows bacteria to grow in the diverticula. This leads to inflammation and infection. What increases the risk? You are more likely to develop this condition if:  You have diverticulosis. The risk for diverticulosis increases if: ? You are overweight or obese. ? You use tobacco products. ? You do not get enough exercise.  You eat a diet that does not include enough fiber. High-fiber foods include fruits, vegetables, beans, nuts, and whole grains. What are the signs or symptoms? Symptoms of this condition may include:  Pain and tenderness in the abdomen. The pain is normally located on the left side of the abdomen, but it may occur in other areas.  Fever and chills.  Bloating.  Cramping.  Nausea.  Vomiting.  Changes in bowel routines.  Blood in your stool. How is this diagnosed? This condition is  diagnosed based on:  Your medical history.  A physical exam.  Tests to make sure there is nothing else causing your condition. These tests may include: ? Blood tests. ? Urine tests. ? Imaging tests of the abdomen, including X-rays, ultrasounds, MRIs, or CT scans. How is this treated? Most cases of this condition are mild and can be treated at home. Treatment may include:  Taking over-the-counter pain medicines.  Following a clear liquid diet.  Taking antibiotic medicines by mouth.  Rest. More severe cases may need to be treated at a hospital. Treatment may include:  Not eating or drinking.  Taking prescription pain medicine.  Receiving antibiotic medicines through an IV tube.  Receiving fluids and nutrition through an IV tube.  Surgery. When your condition is under control, your health care provider may recommend that you have a colonoscopy. This is an exam to look at the entire large intestine. During the exam, a lubricated, bendable tube is inserted into the anus and then passed into the rectum, colon, and other parts of the large intestine. A colonoscopy can show how severe your diverticula are and whether something else may be causing your symptoms. Follow these instructions at home: Medicines  Take over-the-counter and prescription medicines only as told by your health care provider. These include fiber supplements, probiotics, and stool softeners.  If you were prescribed an antibiotic medicine, take it as told by your health care provider. Do not stop taking the antibiotic even if you start to feel better.  Do not drive or use heavy machinery while taking prescription pain medicine. General instructions   Follow a  full liquid diet or another diet as directed by your health care provider. After your symptoms improve, your health care provider may tell you to change your diet. He or she may recommend that you eat a diet that contains at least 25 g (25 grams) of fiber  daily. Fiber makes it easier to pass stool. Healthy sources of fiber include: ? Berries. One cup contains 4-8 grams of fiber. ? Beans or lentils. One half cup contains 5-8 grams of fiber. ? Green vegetables. One cup contains 4 grams of fiber.  Exercise for at least 30 minutes, 3 times each week. You should exercise hard enough to raise your heart rate and break a sweat.  Keep all follow-up visits as told by your health care provider. This is important. You may need a colonoscopy. Contact a health care provider if:  Your pain does not improve.  You have a hard time drinking or eating food.  Your bowel movements do not return to normal. Get help right away if:  Your pain gets worse.  Your symptoms do not get better with treatment.  Your symptoms suddenly get worse.  You have a fever.  You vomit more than one time.  You have stools that are bloody, black, or tarry. Summary  Diverticulitis is infection or inflammation of small pouches (diverticula) in the colon that form due to a condition called diverticulosis. Diverticula can trap stool (feces) and bacteria, causing infection and inflammation.  You are at higher risk for this condition if you have diverticulosis and you eat a diet that does not include enough fiber.  Most cases of this condition are mild and can be treated at home. More severe cases may need to be treated at a hospital.  When your condition is under control, your health care provider may recommend that you have an exam called a colonoscopy. This exam can show how severe your diverticula are and whether something else may be causing your symptoms. This information is not intended to replace advice given to you by your health care provider. Make sure you discuss any questions you have with your health care provider. Document Revised: 07/16/2017 Document Reviewed: 09/05/2016 Elsevier Patient Education  [place] Liquid Diet, Adult A clear liquid  diet is a diet that includes only liquids and semi-liquids that you can see through. You do not eat any food on this diet. Most people need to follow this diet for only a short period of time. You may need to follow a clear liquid diet if:  You develop a medical condition right before or after you have surgery.  You were not able to eat food for a long period of time.  You had a condition that gave you nausea, vomiting, or diarrhea.  You are going to have a procedure or exam to look at parts of your digestive system.  You are going to have bowel surgery. The usual goals of this diet are:  To rest the stomach and digestive system as much as possible.  To help you clear the digestive system before a procedure or exam.  To keep you hydrated.  To make sure you get some calories for energy.  To help you return to your normal eating routine. What are tips for following this plan?  A clear liquid is a liquid or semi-liquid, such as gelatin, that you can see through when you hold it up to a light.  A clear liquid diet does not provide all the  nutrients that you need. It is important to choose a variety of the liquids or semi-liquids that are allowed on this diet. That way, you will get as many nutrients as possible.  If you are not sure whether you can have certain items, ask your health care provider. If you have difficulty swallowing thin liquids, you will need to thicken them to prevent breathing in (aspiration). What foods should I eat?   Water and flavored water.  Fruit juices that do not have pulp, such as cranberry juice and apple juice.  Tea and coffee without milk or cream.  Clear bouillon or broth.  Broth-based soups that have been strained.  Flavored gelatin.  Honey.  Sugar water.  Ice or frozen ice pops that do not contain milk, yogurt, fruit pieces, or fruit pulp.  Clear sodas.  Clear sports drinks. The items listed above may not be a complete list of  recommended foods and beverages. Contact a dietitian for more information. What food should I avoid?  Juices that have pulp.  Milk.  Cream or cream-based soups.  Yogurt.  Regular foods that are not clear liquids or semi-liquids. The items listed above may not be a complete list of foods and beverages to avoid. Contact a dietitian for more information. Questions to ask your health care provider:  How long do I need to follow this diet?  Are there any medicines that I should change while on this diet? Summary  A clear liquid diet is a diet that includes liquids and semi-liquids that you can see through.  The goal of this diet is to help you recover by resting your digestive system, keeping you hydrated, and providing nutrients.  Avoid liquids with milk, cream, or pulp while on this diet. This information is not intended to replace advice given to you by your health care provider. Make sure you discuss any questions you have with your health care provider. Document Revised: 01/24/2018 Document Reviewed: 01/24/2018 Elsevier Patient Education  2020 [employer].

## 2020-01-05 NOTE — Progress Notes (Signed)
01/05/2020  Reason for Visit:  Acute diverticulitis  History of Present Illness: Frank Strickland is a 54 y.o. male presenting for evaluation of persistent diverticulitis.  Patient initially presented to the ED on 12/16/19 after having 3 days of abdominal pain in the lower abdomen, associated with sharp stabbing sensation and nausea.  In the ED, he had labwork showing a WBC of 11.3, and CT scan showing uncomplicated acute diverticulitis of the sigmoid colon with stranding.  He was given a 10 day course of Cipro/Flagyl.  The patient reports that he had side effects from the Flagyl, and he had diarrhea with the antibiotics.  However, his abdominal pain did improve.  After the antibiotic course was completed, he started having peri-rectal pain.  He saw his PCP and there was concern for possible prostatitis, and was started on Bactrim.  However, he does not really think his symptoms improved much.  He started having the same type of lower abdominal pain and stabbing sensation a few days ago, and he decided to come back to the ED for further evaluation yesterday 5/20.  His WBC was at 11.9, and repeat CT scan showed again uncomplicated acute diverticulitis, though with improved inflammation/stranding around the sigmoid colon compared to his CT scan from 5/1.  He was started on Augmentin for 7 day course, and was referred to [place] for further evaluation.    Today, he reports he's taken 3 doses of the Augmentin, and he feels some improvement in the symptoms.  Denies any fevers, chills, chest pain, shortness of breath.  Denies any nausea today and denies diarrhea at this point.  Past Medical History: Past Medical History:  Diagnosis Date  . Allergy, unspecified not elsewhere classified   . Anxiety   . Duodenal ulcer    2014  . Hyperlipidemia   . Hypertension      Past Surgical History: Past Surgical History:  Procedure Laterality Date  . KIDNEY STONE SURGERY  2002   X 2 cystoscopy with stent    Home  Medications: Prior to Admission medications   Medication Sig Start Date End Date Taking? Authorizing Provider  amoxicillin-clavulanate (AUGMENTIN) 875-125 MG tablet Take 1 tablet by mouth 2 (two) times daily for 7 days. 01/04/20 01/11/20 Yes Jene Every, MD  Cholecalciferol 1000 units TBDP Take 1 tablet by mouth daily. 07/01/16  Yes Joaquim Nam, MD  FLUoxetine (PROZAC) 20 MG capsule Take 1 capsule (20 mg total) by mouth daily. 10/23/19  Yes Joaquim Nam, MD  fluticasone [facility]) 50 MCG/ACT nasal spray Place 2 sprays into both nostrils daily. 07/07/17  Yes Fisher, Roselyn Bering, PA-C  hydrochlorothiazide (HYDRODIURIL) 25 MG tablet Take 1 tablet (25 mg total) by mouth daily. 10/23/19  Yes Joaquim Nam, MD  HYDROcodone-acetaminophen (NORCO/VICODIN) 5-325 MG tablet Take 1 tablet by mouth every 4 (four) hours as needed for moderate pain. 01/04/20  Yes Jene Every, MD  Multiple Vitamin (MULTIVITAMIN) tablet Take 1 tablet by mouth daily.   Yes [provider]  ondansetron (ZOFRAN ODT) 4 MG disintegrating tablet Take 1 tablet (4 mg total) by mouth every 6 (six) hours as needed for nausea or vomiting. 12/16/19  Yes Sharyn Creamer, MD  amoxicillin-clavulanate (AUGMENTIN) 875-125 MG tablet Take 1 tablet by mouth 2 (two) times daily for 7 days. 01/05/20 01/12/20  Henrene Dodge, MD    Allergies: Allergies  Allergen Reactions  . Ace Inhibitors   . Angiotensin Receptor Blockers   . Lisinopril Other (See Comments)    Presumed cause  of lip swelling  . Amlodipine Other (See Comments)    intolerant  . Codeine Sulfate     REACTION: headache  . Crestor [Rosuvastatin Calcium]     Aches with 10mg  daily  . Ketorolac Tromethamine     REACTION: severe headache  . Omeprazole Other (See Comments)    Muscle aches per patient   . Pravastatin     aches  . Tessalon [Benzonatate] Other (See Comments)    headache    Social History:  reports that he has quit smoking. His smoking use included cigarettes.  He has never used smokeless tobacco. He reports current alcohol use. He reports that he does not use drugs.   Family History: Family History  Problem Relation Age of Onset  . Allergies Mother   . Cancer Father        lung-smoker  . Heart disease Sister   . Hypertension Sister   . Colon cancer Neg Hx   . Prostate cancer Neg Hx     Review of Systems: Review of Systems  Constitutional: Negative for chills and fever.  HENT: Negative for hearing loss.   Respiratory: Negative for shortness of breath.   Cardiovascular: Negative for chest pain.  Gastrointestinal: Positive for abdominal pain. Negative for constipation, diarrhea, nausea and vomiting.  Genitourinary: Negative for dysuria.  Musculoskeletal: Positive for back pain. Negative for myalgias.  Skin: Negative for rash.  Neurological: Negative for dizziness.  Psychiatric/Behavioral: Negative for depression.    Physical Exam BP 119/77   Pulse (!) 54   Temp (!) 97.3 F (36.3 C)   Resp 12   Ht 5\' 8"  (1.727 m)   Wt 231 lb 12.8 oz (105.1 kg)   SpO2 97%   BMI 35.25 kg/m  CONSTITUTIONAL: No acute distress HEENT:  Normocephalic, atraumatic, extraocular motion intact. NECK: Trachea is midline, and there is no jugular venous distension.  RESPIRATORY:  Lungs are clear, and breath sounds are equal bilaterally. Normal respiratory effort without pathologic use of accessory muscles. CARDIOVASCULAR: Heart is regular without murmurs, gallops, or rubs. GI: The abdomen is soft, non-distended, with tenderness to palpation in the lower abdomen.  No peritonitis. MUSCULOSKELETAL:  Normal muscle strength and tone in all four extremities.  No peripheral edema or cyanosis. SKIN: Skin turgor is normal. There are no pathologic skin lesions.  NEUROLOGIC:  Motor and sensation is grossly normal.  Cranial nerves are grossly intact. PSYCH:  Alert and oriented to person, place and time. Affect is normal.  Laboratory Analysis: Labs 01/04/20: Na 134, K  4.4, Cl 104, CO2 22, BUN 15, Cr 0.73.  Total bilirubin 1.4, AST 23, ALT 17, AlkPhos 50.  WBC 11.9, Hgb 14.5, Hct 41.2, Plt 190  Imaging: CT abdomen/pelvis 01/04/20: IMPRESSION: 1. Sigmoid diverticulitis with decreased but not resolved inflammation. 2. No new finding to explain right lower quadrant pain, including appendicitis.  Assessment and Plan: This is a 54 y.o. male with persistent, though improved acute diverticulitis.  --Discussed with the patient that it is unclear whether this is improving though persistent diverticulitis, vs a new round of diverticulitis.  I think more likely, the 10 day course of Cipro/Flagyl was not long enough for the severity of the inflammation on the CT scan and symptoms.  Now, the diverticulitis is showing up again.  I think the concern for prostatitis could have been related to the patient's hemorrhoids, which could have flared up with the diarrhea caused by the initial course of antibiotics.   --At this point, I discussed with  the patient that we should continue with this second course of antibiotic for his diverticulitis.  However, I think we should do a longer course.  I will give an additional 7 days of Augmentin, so he can complete a total 2 week course. --Also discussed with the patient that for the next 2-3 days, he should keep a clear liquid diet, to allow the diverticulitis to improve without further irritating the sigmoid.  After that, he can advance his diet. --He will follow up in 4 weeks to evaluate his progress.  Return precautions given.  If his symptoms worsen despite of the Augmentin, he is aware that he should call the office/weekend provider and likely present back to the hospital for IV antibiotic management.  Discussed with him that although unlikely, he may require surgery if his symptoms do not improve or if there is failure with medical management.  Face-to-face time spent with the patient and care providers was 45 minutes, with more than 50%  of the time spent counseling, educating, and coordinating care of the patient.     Howie Ill, MD [facility]

## 2020-01-05 NOTE — Telephone Encounter (Signed)
Left detailed message on voicemail to return call with update.  DPR 

## 2020-01-08 ENCOUNTER — Telehealth: Payer: Self-pay | Admitting: *Deleted

## 2020-01-08 ENCOUNTER — Encounter: Payer: Self-pay | Admitting: Surgery

## 2020-01-08 ENCOUNTER — Telehealth: Payer: Self-pay

## 2020-01-08 ENCOUNTER — Other Ambulatory Visit: Payer: Self-pay

## 2020-01-08 ENCOUNTER — Ambulatory Visit (INDEPENDENT_AMBULATORY_CARE_PROVIDER_SITE_OTHER): Payer: Managed Care, Other (non HMO) | Admitting: Surgery

## 2020-01-08 VITALS — BP 117/73 | HR 60 | Temp 97.7°F | Resp 15 | Ht 68.0 in | Wt 229.8 lb

## 2020-01-08 DIAGNOSIS — K5792 Diverticulitis of intestine, part unspecified, without perforation or abscess without bleeding: Secondary | ICD-10-CM

## 2020-01-08 NOTE — Telephone Encounter (Signed)
[facility] Night - Client Nonclinical Telephone Record  [facility] Client [facility] Night - Client Client Site [facility] - Night Physician Duncan, Shaw - MD Contact Type Call Who Is Calling Patient / Member / Family / Caregiver Caller Name Frank Strickland Caller Phone Number [phone] Call Type Message Only Information Provided Reason for Call Returning a Call from the Office Initial Comment Caller states that he had to go to the hospital due to his colon. The doctor called him to check on him today. He wanted to let the doctor know that they changed his medication and put him on another dose of antibiotics. He also went to see a surgeon today and they told him he just had an infection. Additional Comment Disp. Time Disposition Final User 01/05/2020 8:02:54 PM General Information Provided Yes Terry, Letisha Call Closed By: Letisha Terry Transaction Date/Time: 01/05/2020 8:00:06 PM (ET) 

## 2020-01-08 NOTE — Telephone Encounter (Signed)
Patient called and stated that he was seen for diverticulitis and is having constant pain in the lower abdomen and around to his back. He denies vomiting and nausea. He described the pain being constant but more in his back. Patient is worried that he should of been getting better now since he has been on the antibiotics.

## 2020-01-08 NOTE — Telephone Encounter (Signed)
[facility] Night - Client Nonclinical Telephone Record  [facility] Client [facility] Night - Client Client Site  [facility] - Night Physician Raechel Ache - MD Contact Type Call Who Is Calling Patient / Member / Family / Caregiver Caller Name Frank Strickland Caller Phone Number [phone] Call Type Message Only Information Provided Reason for Call Returning a Call from the Office Initial Comment Caller states that he had to go to the hospital due to his colon. The doctor called him to check on him today. He wanted to let the doctor know that they changed his medication and put him on another dose of antibiotics. He also went to see a surgeon today and they told him he just had an infection. Additional Comment Disp. Time Disposition Final User 01/05/2020 8:02:54 PM General Information Provided Yes Edmonia Lynch Call Closed By: Edmonia Lynch Transaction Date/Time: 01/05/2020 8:00:06 PM (ET)

## 2020-01-08 NOTE — Patient Instructions (Addendum)
You may go ahead and start taking 600-800 mg of Ibuprofen every 8 hours as needed. These may help with the residual discomfort.  Let us know if the pain starts becoming sharp, worsening, or if you develop a fever.   Follow up here next Wednesday. You may cancel this if you are feeling much better.   Otherwise we will see you on June 21st as scheduled.

## 2020-01-09 ENCOUNTER — Encounter: Payer: Self-pay | Admitting: Surgery

## 2020-01-09 NOTE — Progress Notes (Signed)
01/08/2020  History of Present Illness: Frank Strickland is a 54 y.o. male with recent acute diverticulitis, presenting for follow up.  He was started on Augmentin on 5/20 after going back to ED for abdominal pain.  CT scan showed acute diverticulitis, uncomplicated, with improving stranding.  He called the office because he was still having some discomfort in his lower abdomen, associated with chills.  Overall, since his ED visit, the patient reports that his sharp lower abominal pain has resolved and only has some discomfort/burning sensation.  Denies any further nausea, and is having normal bowel movements.  Past Medical History: Past Medical History:  Diagnosis Date  . Allergy, unspecified not elsewhere classified   . Anxiety   . Duodenal ulcer    2014  . Hyperlipidemia   . Hypertension      Past Surgical History: Past Surgical History:  Procedure Laterality Date  . KIDNEY STONE SURGERY  2002   X 2 cystoscopy with stent    Home Medications: Prior to Admission medications   Medication Sig Start Date End Date Taking? Authorizing Provider  amoxicillin-clavulanate (AUGMENTIN) 875-125 MG tablet Take 1 tablet by mouth 2 (two) times daily for 7 days. 01/05/20 01/12/20 Yes Gerard Bonus, Jacqulyn Bath, MD  Cholecalciferol 1000 units TBDP Take 1 tablet by mouth daily. 07/01/16  Yes Tonia Ghent, MD  FLUoxetine (PROZAC) 20 MG capsule Take 1 capsule (20 mg total) by mouth daily. 10/23/19  Yes Tonia Ghent, MD  fluticasone [facility]) 50 MCG/ACT nasal spray Place 2 sprays into both nostrils daily. 07/07/17  Yes Fisher, Linden Dolin, PA-C  hydrochlorothiazide (HYDRODIURIL) 25 MG tablet Take 1 tablet (25 mg total) by mouth daily. 10/23/19  Yes Tonia Ghent, MD  Multiple Vitamin (MULTIVITAMIN) tablet Take 1 tablet by mouth daily.   Yes [provider]    Allergies: Allergies  Allergen Reactions  . Ace Inhibitors   . Angiotensin Receptor Blockers   . Lisinopril Other (See Comments)    Presumed  cause of lip swelling  . Amlodipine Other (See Comments)    intolerant  . Codeine Sulfate     REACTION: headache  . Crestor [Rosuvastatin Calcium]     Aches with 10mg  daily  . Ketorolac Tromethamine     REACTION: severe headache  . Omeprazole Other (See Comments)    Muscle aches per patient   . Pravastatin     aches  . Tessalon [Benzonatate] Other (See Comments)    headache    Review of Systems: Review of Systems  Constitutional: Positive for chills. Negative for fever.  Respiratory: Negative for shortness of breath.   Cardiovascular: Negative for chest pain.  Gastrointestinal: Positive for abdominal pain. Negative for constipation, diarrhea, nausea and vomiting.  Skin: Negative for rash.    Physical Exam BP 117/73   Pulse 60   Temp 97.7 F (36.5 C)   Resp 15   Ht 5\' 8"  (1.727 m)   Wt 229 lb 12.8 oz (104.2 kg)   SpO2 98%   BMI 34.94 kg/m  CONSTITUTIONAL: No acute distress HEENT:  Normocephalic, atraumatic, extraocular motion intact. RESPIRATORY:  Lungs are clear, and breath sounds are equal bilaterally. Normal respiratory effort without pathologic use of accessory muscles. CARDIOVASCULAR: Heart is regular without murmurs, gallops, or rubs. GI: The abdomen is soft, non-distended, with some mild tenderness to palpation over lower abdomen.  This is improved compared to our visit on 5/21. NEUROLOGIC:  Motor and sensation is grossly normal.  Cranial nerves are grossly intact. PSYCH:  Alert and oriented to person, place and time. Affect is normal.  Labs/Imaging: None new  Assessment and Plan: This is a 54 y.o. male with acute diverticulitis, with improving symptoms, but also some residual discomfort.  --Discussed with the patient that given that the worst of the symptoms have improved, at this point I don't think we need to repeat CT scans or obtain new labwork.  This may be residual discomfort from the inflammation from his diverticulitis.  However, also discussed with  him that if there is any worsening, we would repeat CT scan to evaluate for abscess.  Depending on the degree of worsening, could potentially have to admit for IV abx.  At this point he will follow up closely next week.  Face-to-face time spent with the patient and care providers was 15 minutes, with more than 50% of the time spent counseling, educating, and coordinating care of the patient.     Howie Ill, MD [facility]

## 2020-01-10 NOTE — Telephone Encounter (Signed)
Called to check on patient.  I had gotten his consult notes.  He is improved but not back to baseline and he has follow-up pending with surgery.  He still on antibiotics.  He is still out of work.  I told him if he needs a work note to please let me know.  He will update me as needed.  He thanked me for the call and I appreciated him talking to me.  I appreciate the help of all involved.

## 2020-01-17 ENCOUNTER — Ambulatory Visit: Payer: Managed Care, Other (non HMO) | Admitting: Surgery

## 2020-01-19 ENCOUNTER — Encounter: Payer: Self-pay | Admitting: Surgery

## 2020-01-19 ENCOUNTER — Ambulatory Visit (INDEPENDENT_AMBULATORY_CARE_PROVIDER_SITE_OTHER): Payer: Managed Care, Other (non HMO) | Admitting: Surgery

## 2020-01-19 ENCOUNTER — Other Ambulatory Visit: Payer: Self-pay

## 2020-01-19 VITALS — BP 116/78 | HR 55 | Temp 97.7°F | Resp 12 | Ht 68.0 in | Wt 231.0 lb

## 2020-01-19 DIAGNOSIS — K5792 Diverticulitis of intestine, part unspecified, without perforation or abscess without bleeding: Secondary | ICD-10-CM

## 2020-01-19 NOTE — Patient Instructions (Addendum)
Please call with any questions or concerns.   Referral has been sent to [facility] for you to have a Colonoscopy.   Someone from their office will contact you to schedule the appointment. If you have not heard from their office within 7-10 days please call our office so we can check on this for you.  Please see your follow up appointment listed below.

## 2020-01-19 NOTE — Progress Notes (Signed)
01/19/2020  History of Present Illness: Frank Strickland is a 54 y.o. male presenting for follow up of acute diverticulitis.  He was last seen on 5/24 during which time he reported some persistent soreness, but that the pain had improved compared to his second ER visit.  He continued his antibiotic regimen and recently completed the course.  Today, he reports that things continue to improve and there is only some minimal soreness left.  Denies any worsening pain, nausea, vomiting, fevers, or chills.    Past Medical History: Past Medical History:  Diagnosis Date   Allergy, unspecified not elsewhere classified    Anxiety    Duodenal ulcer    2014   Hyperlipidemia    Hypertension      Past Surgical History: Past Surgical History:  Procedure Laterality Date   KIDNEY STONE SURGERY  2002   X 2 cystoscopy with stent    Home Medications: Prior to Admission medications   Medication Sig Start Date End Date Taking? Authorizing Provider  Cholecalciferol 1000 units TBDP Take 1 tablet by mouth daily. 07/01/16  Yes Tonia Ghent, MD  FLUoxetine (PROZAC) 20 MG capsule Take 1 capsule (20 mg total) by mouth daily. 10/23/19  Yes Tonia Ghent, MD  fluticasone [facility]) 50 MCG/ACT nasal spray Place 2 sprays into both nostrils daily. 07/07/17  Yes Fisher, Linden Dolin, PA-C  hydrochlorothiazide (HYDRODIURIL) 25 MG tablet Take 1 tablet (25 mg total) by mouth daily. 10/23/19  Yes Tonia Ghent, MD  Multiple Vitamin (MULTIVITAMIN) tablet Take 1 tablet by mouth daily.   Yes [provider]    Allergies: Allergies  Allergen Reactions   Ace Inhibitors    Angiotensin Receptor Blockers    Lisinopril Other (See Comments)    Presumed cause of lip swelling   Amlodipine Other (See Comments)    intolerant   Codeine Sulfate     REACTION: headache   Crestor [Rosuvastatin Calcium]     Aches with 10mg  daily   Ketorolac Tromethamine     REACTION: severe headache   Omeprazole Other (See  Comments)    Muscle aches per patient    Pravastatin     aches   Tessalon [Benzonatate] Other (See Comments)    headache    Review of Systems: Review of Systems  Constitutional: Negative for chills and fever.  Respiratory: Negative for shortness of breath.   Cardiovascular: Negative for chest pain.  Gastrointestinal: Positive for abdominal pain (soreness). Negative for constipation, diarrhea, nausea and vomiting.    Physical Exam BP 116/78    Pulse (!) 55    Temp 97.7 F (36.5 C) (Oral)    Resp 12    Ht 5\' 8"  (1.727 m)    Wt 231 lb (104.8 kg)    SpO2 97%    BMI 35.12 kg/m  CONSTITUTIONAL: No acute distress HEENT:  Normocephalic, atraumatic, extraocular motion intact. RESPIRATORY:  Lungs are clear, and breath sounds are equal bilaterally. Normal respiratory effort without pathologic use of accessory muscles. CARDIOVASCULAR: Heart is regular without murmurs, gallops, or rubs. GI: The abdomen is soft, non-distended, non-tender.  Much improved exam compared to prior. NEUROLOGIC:  Motor and sensation is grossly normal.  Cranial nerves are grossly intact. PSYCH:  Alert and oriented to person, place and time. Affect is normal.  Labs/Imaging: None recently  Assessment and Plan: This is a 54 y.o. male with hx of recent acute diverticulitis.  Discussed with the patient that it seems to me that his symptoms continue to  improve, particularly after the second antibiotic course regimen with Augmentin.  Discussed with him that some residual soreness is not uncommon due to the inflammation or scarring that happens after these episodes.  What is reassuring is that his pain is not worsening, and he no longer has nausea or fevers/chills.  With this in mind, I think it's appropriate to send a GI referral today to set up colonoscopy towards the end of July.  He will follow up with me afterwards to discuss results and to discuss possible option for surgery.  Although this episode was uncomplicated,  it has lasted about a month and required two different antibiotic courses, so I think it is worth the discussion in the future about surgery vs watchful waiting.  Face-to-face time spent with the patient and care providers was 25 minutes, with more than 50% of the time spent counseling, educating, and coordinating care of the patient.     Howie Ill, MD [facility]

## 2020-01-22 ENCOUNTER — Encounter: Payer: Self-pay | Admitting: Surgery

## 2020-02-01 ENCOUNTER — Other Ambulatory Visit: Payer: Self-pay

## 2020-02-01 ENCOUNTER — Telehealth (INDEPENDENT_AMBULATORY_CARE_PROVIDER_SITE_OTHER): Payer: Self-pay | Admitting: Gastroenterology

## 2020-02-01 DIAGNOSIS — K5792 Diverticulitis of intestine, part unspecified, without perforation or abscess without bleeding: Secondary | ICD-10-CM

## 2020-02-01 DIAGNOSIS — K5732 Diverticulitis of large intestine without perforation or abscess without bleeding: Secondary | ICD-10-CM

## 2020-02-01 NOTE — Progress Notes (Signed)
Gastroenterology Pre-Procedure Review  Request Date: 03/19/20 Requesting Physician: Dr. Servando Snare  PATIENT REVIEW QUESTIONS: The patient responded to the following health history questions as indicated:    1. Are you having any GI issues? no 2. Do you have a personal history of Polyps? no 3. Do you have a family history of Colon Cancer or Polyps? no 4. Diabetes Mellitus? no 5. Joint replacements in the past 12 months?no 6. Major health problems in the past 3 months?no 7. Any artificial heart valves, MVP, or defibrillator?no    MEDICATIONS & ALLERGIES:    Patient reports the following regarding taking any anticoagulation/antiplatelet therapy:   Plavix, Coumadin, Eliquis, Xarelto, Lovenox, Pradaxa, Brilinta, or Effient? no Aspirin? no  Patient confirms/reports the following medications:  Current Outpatient Medications  Medication Sig Dispense Refill  . Cholecalciferol 1000 units TBDP Take 1 tablet by mouth daily.    Marland Kitchen FLUoxetine (PROZAC) 20 MG capsule Take 1 capsule (20 mg total) by mouth daily. 90 capsule 3  . fluticasone (FLONASE) 50 MCG/ACT nasal spray Place 2 sprays into both nostrils daily. 16 g 6  . hydrochlorothiazide (HYDRODIURIL) 25 MG tablet Take 1 tablet (25 mg total) by mouth daily. 90 tablet 3  . Multiple Vitamin (MULTIVITAMIN) tablet Take 1 tablet by mouth daily.     No current facility-administered medications for this visit.    Patient confirms/reports the following allergies:  Allergies  Allergen Reactions  . Ace Inhibitors   . Angiotensin Receptor Blockers   . Lisinopril Other (See Comments)    Presumed cause of lip swelling  . Amlodipine Other (See Comments)    intolerant  . Codeine Sulfate     REACTION: headache  . Crestor [Rosuvastatin Calcium]     Aches with 10mg  daily  . Ketorolac Tromethamine     REACTION: severe headache  . Omeprazole Other (See Comments)    Muscle aches per patient   . Pravastatin     aches  . Tessalon [Benzonatate] Other (See  Comments)    headache    No orders of the defined types were placed in this encounter.   AUTHORIZATION INFORMATION Primary Insurance: 1D#: Group #:  Secondary Insurance: 1D#: Group #:  SCHEDULE INFORMATION: Date: Tuesday 03/19/20 Time: Location:[facility]

## 2020-02-05 ENCOUNTER — Ambulatory Visit: Payer: Self-pay | Admitting: Surgery

## 2020-03-15 ENCOUNTER — Other Ambulatory Visit: Payer: Self-pay

## 2020-03-15 ENCOUNTER — Other Ambulatory Visit
Admission: RE | Admit: 2020-03-15 | Discharge: 2020-03-15 | Disposition: A | Discharge status: Home / Self Care | Payer: Managed Care, Other (non HMO) | Source: Ambulatory Visit | Attending: Gastroenterology | Admitting: Gastroenterology

## 2020-03-15 DIAGNOSIS — Z01812 Encounter for preprocedural laboratory examination: Secondary | ICD-10-CM | POA: Diagnosis present

## 2020-03-15 DIAGNOSIS — Z20822 Contact with and (suspected) exposure to covid-19: Secondary | ICD-10-CM | POA: Diagnosis not present

## 2020-03-15 LAB — SARS CORONAVIRUS 2 (TAT 6-24 HRS): SARS Coronavirus 2: NEGATIVE

## 2020-03-18 ENCOUNTER — Encounter: Payer: Self-pay | Admitting: Gastroenterology

## 2020-03-19 ENCOUNTER — Ambulatory Visit: Payer: Managed Care, Other (non HMO) | Admitting: Anesthesiology

## 2020-03-19 ENCOUNTER — Other Ambulatory Visit: Payer: Self-pay

## 2020-03-19 ENCOUNTER — Encounter
Admission: RE | Disposition: A | Discharge status: Home / Self Care | Payer: Self-pay | Source: Home / Self Care | Attending: Gastroenterology

## 2020-03-19 ENCOUNTER — Ambulatory Visit
Admission: RE | Admit: 2020-03-19 | Discharge: 2020-03-19 | Disposition: A | Discharge status: Home / Self Care | Payer: Managed Care, Other (non HMO) | Attending: Gastroenterology | Admitting: Gastroenterology

## 2020-03-19 DIAGNOSIS — Z1211 Encounter for screening for malignant neoplasm of colon: Secondary | ICD-10-CM

## 2020-03-19 DIAGNOSIS — Z79899 Other long term (current) drug therapy: Secondary | ICD-10-CM | POA: Diagnosis not present

## 2020-03-19 DIAGNOSIS — Z87891 Personal history of nicotine dependence: Secondary | ICD-10-CM | POA: Diagnosis not present

## 2020-03-19 DIAGNOSIS — I10 Essential (primary) hypertension: Secondary | ICD-10-CM | POA: Diagnosis not present

## 2020-03-19 DIAGNOSIS — F419 Anxiety disorder, unspecified: Secondary | ICD-10-CM | POA: Diagnosis not present

## 2020-03-19 DIAGNOSIS — K5792 Diverticulitis of intestine, part unspecified, without perforation or abscess without bleeding: Secondary | ICD-10-CM

## 2020-03-19 DIAGNOSIS — K640 First degree hemorrhoids: Secondary | ICD-10-CM | POA: Diagnosis not present

## 2020-03-19 HISTORY — PX: COLONOSCOPY WITH PROPOFOL: SHX5780

## 2020-03-19 SURGERY — COLONOSCOPY WITH PROPOFOL
Anesthesia: General

## 2020-03-19 MED ORDER — PROPOFOL 500 MG/50ML IV EMUL
INTRAVENOUS | Status: AC
  Filled 2020-03-19: qty 50

## 2020-03-19 MED ORDER — LIDOCAINE HCL (PF) 2 % IJ SOLN
INTRAMUSCULAR | Status: AC
  Filled 2020-03-19: qty 35

## 2020-03-19 MED ORDER — PROPOFOL 500 MG/50ML IV EMUL
INTRAVENOUS | Status: AC
  Filled 2020-03-19: qty 450

## 2020-03-19 MED ORDER — PROPOFOL 10 MG/ML IV BOLUS
INTRAVENOUS | Status: AC
  Filled 2020-03-19: qty 40

## 2020-03-19 MED ORDER — SODIUM CHLORIDE 0.9 % IV SOLN
INTRAVENOUS | Status: DC
  Administered 2020-03-19: 1000 mL via INTRAVENOUS

## 2020-03-19 MED ORDER — PROPOFOL 500 MG/50ML IV EMUL
INTRAVENOUS | Status: DC | PRN
  Administered 2020-03-19: 175 ug/kg/min via INTRAVENOUS

## 2020-03-19 MED ORDER — MIDAZOLAM HCL 2 MG/2ML IJ SOLN
INTRAMUSCULAR | Status: AC
  Filled 2020-03-19: qty 2

## 2020-03-19 MED ORDER — PROPOFOL 10 MG/ML IV BOLUS
INTRAVENOUS | Status: DC | PRN
  Administered 2020-03-19: 10 mg via INTRAVENOUS
  Administered 2020-03-19: 60 mg via INTRAVENOUS

## 2020-03-19 MED ORDER — LIDOCAINE HCL (CARDIAC) PF 100 MG/5ML IV SOSY
PREFILLED_SYRINGE | INTRAVENOUS | Status: DC | PRN
  Administered 2020-03-19: 100 mg via INTRAVENOUS

## 2020-03-19 NOTE — Op Note (Signed)
[facility] Gastroenterology Patient Name: Frank Strickland Procedure Date: 03/19/2020 8:30 AM MRN: [id number] Account #: [id number] Date of Birth: 07-12-1966 Admit Type: Outpatient Age: 54 Room: [facility] ENDO ROOM 4 Gender: Male Note Status: Finalized Procedure:             Colonoscopy Indications:           Screening for colorectal malignant neoplasm Providers:             Midge Minium MD, MD Referring MD:          Dwana Curd. Para March, MD (Referring MD) Medicines:             Propofol per Anesthesia Complications:         No immediate complications. Procedure:             Pre-Anesthesia Assessment:                        - Prior to the procedure, a History and Physical was                         performed, and patient medications and allergies were                         reviewed. The patient's tolerance of previous                         anesthesia was also reviewed. The risks and benefits                         of the procedure and the sedation options and risks                         were discussed with the patient. All questions were                         answered, and informed consent was obtained. Prior                         Anticoagulants: The patient has taken no previous                         anticoagulant or antiplatelet agents. ASA Grade                         Assessment: II - A patient with mild systemic disease.                         After reviewing the risks and benefits, the patient                         was deemed in satisfactory condition to undergo the                         procedure.                        After obtaining informed consent, the colonoscope was  passed under direct vision. Throughout the procedure,                         the patient's blood pressure, pulse, and oxygen                         saturations were monitored continuously. The                         Colonoscope was introduced through the  anus and                         advanced to the the cecum, identified by appendiceal                         orifice and ileocecal valve. The colonoscopy was                         performed without difficulty. The patient tolerated                         the procedure well. The quality of the bowel                         preparation was excellent. Findings:      The perianal and digital rectal examinations were normal.      Non-bleeding internal hemorrhoids were found during retroflexion. The       hemorrhoids were Grade I (internal hemorrhoids that do not prolapse). Impression:            - Non-bleeding internal hemorrhoids.                        - No specimens collected. Recommendation:        - Discharge patient to home.                        - Resume previous diet.                        - Continue present medications.                        - Repeat colonoscopy in 10 years for screening unless                         any change in family history or lower GI problems. Procedure Code(s):     --- Professional ---                        [phone], Colonoscopy, flexible; diagnostic, including                         collection of specimen(s) by brushing or washing, when                         performed (separate procedure) Diagnosis Code(s):     --- Professional ---                        Z12.11, Encounter for  screening for malignant neoplasm                         of colon CPT copyright 2019 American Medical Association. All rights reserved. The codes documented in this report are preliminary and upon coder review may  be revised to meet current compliance requirements. Midge Minium MD, MD 03/19/2020 8:45:14 AM This report has been signed electronically. Number of Addenda: 0 Note Initiated On: 03/19/2020 8:30 AM Scope Withdrawal Time: 0 hours 7 minutes 51 seconds  Total Procedure Duration: 0 hours 9 minutes 28 seconds  Estimated Blood Loss:  Estimated blood loss: none.       [facility]

## 2020-03-19 NOTE — Anesthesia Procedure Notes (Signed)
Procedure Name: General with mask airway Performed by: Fletcher-Harrison, Cadin Luka, CRNA Pre-anesthesia Checklist: Patient identified, Emergency Drugs available, Suction available and Patient being monitored Patient Re-evaluated:Patient Re-evaluated prior to induction Oxygen Delivery Method: Simple face mask Induction Type: IV induction Placement Confirmation: positive ETCO2 and CO2 detector Dental Injury: Teeth and Oropharynx as per pre-operative assessment        

## 2020-03-19 NOTE — Anesthesia Preprocedure Evaluation (Signed)
Anesthesia Evaluation  Patient identified by MRN, date of birth, ID band Patient awake    Reviewed: Allergy & Precautions, H&P , NPO status , Patient's Chart, lab work & pertinent test results, reviewed documented beta blocker date and time   Airway Mallampati: II   Neck ROM: full    Dental  (+) Poor Dentition   Pulmonary neg pulmonary ROS, former smoker,    Pulmonary exam normal        Cardiovascular Exercise Tolerance: Good hypertension, On Medications + CAD  negative cardio ROS Normal cardiovascular exam Rhythm:regular Rate:Normal     Neuro/Psych PSYCHIATRIC DISORDERS Anxiety Depression negative neurological ROS  negative psych ROS   GI/Hepatic negative GI ROS, Neg liver ROS, PUD, GERD  Medicated,  Endo/Other  negative endocrine ROS  Renal/GU negative Renal ROS  negative genitourinary   Musculoskeletal   Abdominal   Peds  Hematology negative hematology ROS (+)   Anesthesia Other Findings Past Medical History: No date: Allergy, unspecified not elsewhere classified No date: Anxiety No date: Duodenal ulcer     Comment:  2014 No date: Hyperlipidemia No date: Hypertension Past Surgical History: 2002: KIDNEY STONE SURGERY     Comment:  X 2 cystoscopy with stent BMI    Body Mass Index: 34.97 kg/m     Reproductive/Obstetrics negative OB ROS                             Anesthesia Physical Anesthesia Plan  ASA: III  Anesthesia Plan: General   Post-op Pain Management:    Induction:   PONV Risk Score and Plan:   Airway Management Planned:   Additional Equipment:   Intra-op Plan:   Post-operative Plan:   Informed Consent: I have reviewed the patients History and Physical, chart, labs and discussed the procedure including the risks, benefits and alternatives for the proposed anesthesia with the patient or authorized representative who has indicated his/her understanding and  acceptance.     Dental Advisory Given  Plan Discussed with: CRNA  Anesthesia Plan Comments:         Anesthesia Quick Evaluation

## 2020-03-19 NOTE — Transfer of Care (Signed)
Immediate Anesthesia Transfer of Care Note  Patient: Marden Noble  Procedure(s) Performed: COLONOSCOPY WITH PROPOFOL (N/A )  Patient Location: Endoscopy Unit  Anesthesia Type:General  Level of Consciousness: drowsy and responds to stimulation  Airway & Oxygen Therapy: Patient Spontanous Breathing and Patient connected to face mask oxygen  Post-op Assessment: Report given to RN and Post -op Vital signs reviewed and stable  Post vital signs: Reviewed and stable  Last Vitals:  Vitals Value Taken Time  BP 110/68 03/19/20 0847  Temp    Pulse 62 03/19/20 0847  Resp 11 03/19/20 0847  SpO2 100 % 03/19/20 0847  Vitals shown include unvalidated device data.  Last Pain:  Vitals:   03/19/20 0742  TempSrc: Temporal  PainSc: 0-No pain         Complications: No complications documented.

## 2020-03-19 NOTE — Progress Notes (Signed)
   03/19/20 0800  Clinical Encounter Type  Visited With Family  Visit Type Initial  Referral From Chaplain  Consult/Referral To Chaplain  While rounding SDS waiting area chaplain spoke with patient's wife.Patient's wife said that people are praying for her husband. Patient's wife talked about a prayer group at her job where several ladies get together and have devotion and pray 15 minutes before they start work. She said that she wants to see everyone through the eyes of Christ. Chaplain wished her and her husband well.

## 2020-03-19 NOTE — H&P (Signed)
Midge Minium, MD [facility] [address] [place], [place] [address] Phone: [phone] Fax : [phone]  Primary Care Physician:  Joaquim Nam, MD Primary Gastroenterologist:  Dr. Servando Snare  Pre-Procedure History & Physical: HPI:  Frank Strickland is a 54 y.o. male is here for a screening colonoscopy.   Past Medical History:  Diagnosis Date  . Allergy, unspecified not elsewhere classified   . Anxiety   . Duodenal ulcer    2014  . Hyperlipidemia   . Hypertension     Past Surgical History:  Procedure Laterality Date  . KIDNEY STONE SURGERY  2002   X 2 cystoscopy with stent    Prior to Admission medications   Medication Sig Start Date End Date Taking? Authorizing Provider  Cholecalciferol 1000 units TBDP Take 1 tablet by mouth daily. 07/01/16  Yes Joaquim Nam, MD  FLUoxetine (PROZAC) 20 MG capsule Take 1 capsule (20 mg total) by mouth daily. 10/23/19  Yes Joaquim Nam, MD  fluticasone [facility]) 50 MCG/ACT nasal spray Place 2 sprays into both nostrils daily. 07/07/17  Yes Fisher, Roselyn Bering, PA-C  hydrochlorothiazide (HYDRODIURIL) 25 MG tablet Take 1 tablet (25 mg total) by mouth daily. 10/23/19  Yes Joaquim Nam, MD  Multiple Vitamin (MULTIVITAMIN) tablet Take 1 tablet by mouth daily.   Yes [provider]    Allergies as of 02/01/2020 - Review Complete 02/01/2020  Allergen Reaction Noted  . Ace inhibitors  01/08/2016  . Angiotensin receptor blockers  01/08/2016  . Lisinopril Other (See Comments) 01/08/2016  . Amlodipine Other (See Comments) 01/16/2016  . Codeine sulfate  02/22/2007  . Crestor [rosuvastatin calcium]  09/02/2018  . Ketorolac tromethamine  02/23/2007  . Omeprazole Other (See Comments) 04/22/2018  . Pravastatin  12/27/2018  . Tessalon [benzonatate] Other (See Comments) 09/17/2015    Family History  Problem Relation Age of Onset  . Allergies Mother   . Cancer Father        lung-smoker  . Heart disease Sister   . Hypertension  Sister   . Colon cancer Neg Hx   . Prostate cancer Neg Hx     Social History   Socioeconomic History  . Marital status: Married    Spouse name: Not on file  . Number of children: 3  . Years of education: Not on file  . Highest education level: Not on file  Occupational History    Employer: [employer]  Tobacco Use  . Smoking status: Former Smoker    Types: Cigarettes  . Smokeless tobacco: Never Used  . Tobacco comment: quit 1997  Substance and Sexual Activity  . Alcohol use: Yes    Comment: Occasional  . Drug use: No  . Sexual activity: Not on file  Other Topics Concern  . Not on file  Social History Narrative   remarried, 1991   1 biological child, 2 step kids   Works for [employer], Music therapist   Social Determinants of Corporate investment banker Strain:   . Difficulty of Paying Living Expenses:   Food Insecurity:   . Worried About Programme researcher, broadcasting/film/video in the Last Year:   . Barista in the Last Year:   Transportation Needs:   . Freight forwarder (Medical):   Marland Kitchen Lack of Transportation (Non-Medical):   Physical Activity:   . Days of Exercise per Week:   . Minutes of Exercise per Session:   Stress:   . Feeling of Stress :  Social Connections:   . Frequency of Communication with Friends and Family:   . Frequency of Social Gatherings with Friends and Family:   . Attends Religious Services:   . Active Member of Clubs or Organizations:   . Attends Banker Meetings:   Marland Kitchen Marital Status:   Intimate Partner Violence:   . Fear of Current or Ex-Partner:   . Emotionally Abused:   Marland Kitchen Physically Abused:   . Sexually Abused:     Review of Systems: See HPI, otherwise negative ROS  Physical Exam: BP (!) 154/123   Pulse 60   Temp (!) 96.3 F (35.7 C) (Temporal)   Resp 20   Ht 5\' 8"  (1.727 m)   Wt 104.3 kg   SpO2 99%   BMI 34.97 kg/m  General:   Alert,  pleasant and cooperative in NAD Head:  Normocephalic and atraumatic. Neck:   Supple; no masses or thyromegaly. Lungs:  Clear throughout to auscultation.    Heart:  Regular rate and rhythm. Abdomen:  Soft, nontender and nondistended. Normal bowel sounds, without guarding, and without rebound.   Neurologic:  Alert and  oriented x4;  grossly normal neurologically.  Impression/Plan: Frank Strickland is now here to undergo a screening colonoscopy.  Risks, benefits, and alternatives regarding colonoscopy have been reviewed with the patient.  Questions have been answered.  All parties agreeable.

## 2020-03-20 ENCOUNTER — Other Ambulatory Visit: Payer: Self-pay | Admitting: Family Medicine

## 2020-03-20 ENCOUNTER — Encounter: Payer: Self-pay | Admitting: Gastroenterology

## 2020-03-20 NOTE — Anesthesia Postprocedure Evaluation (Signed)
Anesthesia Post Note  Patient: Frank Strickland  Procedure(s) Performed: COLONOSCOPY WITH PROPOFOL (N/A )  Patient location during evaluation: PACU Anesthesia Type: General Level of consciousness: awake and alert Pain management: pain level controlled Vital Signs Assessment: post-procedure vital signs reviewed and stable Respiratory status: spontaneous breathing, nonlabored ventilation, respiratory function stable and patient connected to nasal cannula oxygen Cardiovascular status: blood pressure returned to baseline and stable Postop Assessment: no apparent nausea or vomiting Anesthetic complications: no   No complications documented.   Last Vitals:  Vitals:   03/19/20 0857 03/19/20 0907  BP: 110/76 118/81  Pulse: (!) 55 (!) 54  Resp: 11 11  Temp:    SpO2: 98% 98%    Last Pain:  Vitals:   03/19/20 0907  TempSrc:   PainSc: 0-No pain                 Yevette Edwards

## 2020-04-03 ENCOUNTER — Other Ambulatory Visit: Payer: Self-pay

## 2020-04-03 ENCOUNTER — Encounter: Payer: Self-pay | Admitting: Surgery

## 2020-04-03 ENCOUNTER — Ambulatory Visit (INDEPENDENT_AMBULATORY_CARE_PROVIDER_SITE_OTHER): Payer: Managed Care, Other (non HMO) | Admitting: Surgery

## 2020-04-03 VITALS — BP 138/87 | HR 64 | Temp 98.8°F | Ht 68.0 in | Wt 236.4 lb

## 2020-04-03 DIAGNOSIS — K5792 Diverticulitis of intestine, part unspecified, without perforation or abscess without bleeding: Secondary | ICD-10-CM

## 2020-04-03 NOTE — Progress Notes (Signed)
04/03/2020  History of Present Illness: Frank Strickland is a 54 y.o. male presenting for follow up of diverticulitis.  He had presented to the ED on 12/16/19 and had CT scan showing non-complicated sigmoid diverticulitis and was started on Cipro/Flagyl.  He presented to his PCP later on with pain and was diagnosed with probable prostatitis and was given Bactrim.  He presented to the ED again on 5/20 with abdominal pain and CT scan showed improved but persistent diverticulitis.  He was started on Augmentin.  He eventually started improving and by early June was feeling much better.  He had a colonoscopy on 8/3 which did not reveal any masses.  Today, he reports that he's been doing very well.  He has changed his diet drastically and is doing a high fiber diet and eating much healthier.  He feels like he has a lot more energy and is starting to work out as well.  He says he has not felt this good in probably two years.  Past Medical History: Past Medical History:  Diagnosis Date  . Allergy, unspecified not elsewhere classified   . Anxiety   . Duodenal ulcer    2014  . Hyperlipidemia   . Hypertension      Past Surgical History: Past Surgical History:  Procedure Laterality Date  . COLONOSCOPY WITH PROPOFOL N/A 03/19/2020   Procedure: COLONOSCOPY WITH PROPOFOL;  Surgeon: Midge Minium, MD;  Location: [facility] ENDOSCOPY;  Service: Endoscopy;  Laterality: N/A;  . KIDNEY STONE SURGERY  2002   X 2 cystoscopy with stent    Home Medications: Prior to Admission medications   Medication Sig Start Date End Date Taking? Authorizing Provider  Cholecalciferol 1000 units TBDP Take 1 tablet by mouth daily. 07/01/16  Yes Joaquim Nam, MD  FLUoxetine (PROZAC) 20 MG capsule Take 1 capsule (20 mg total) by mouth daily. 10/23/19  Yes Joaquim Nam, MD  fluticasone [facility]) 50 MCG/ACT nasal spray Place 2 sprays into both nostrils daily. 07/07/17  Yes Fisher, Roselyn Bering, PA-C  hydrochlorothiazide (HYDRODIURIL) 25 MG  tablet TAKE 1 TABLET(25 MG) BY MOUTH DAILY 03/20/20  Yes Joaquim Nam, MD  Multiple Vitamin (MULTIVITAMIN) tablet Take 1 tablet by mouth daily.   Yes [provider]    Allergies: Allergies  Allergen Reactions  . Ace Inhibitors   . Angiotensin Receptor Blockers   . Lisinopril Other (See Comments)    Presumed cause of lip swelling  . Amlodipine Other (See Comments)    intolerant  . Codeine Sulfate     REACTION: headache  . Crestor [Rosuvastatin Calcium]     Aches with 10mg  daily  . Ketorolac Tromethamine     REACTION: severe headache  . Omeprazole Other (See Comments)    Muscle aches per patient   . Pravastatin     aches  . Tessalon [Benzonatate] Other (See Comments)    headache    Review of Systems: Review of Systems  Constitutional: Negative for chills and fever.  Respiratory: Negative for shortness of breath.   Cardiovascular: Negative for chest pain.  Gastrointestinal: Negative for abdominal pain, constipation, diarrhea, nausea and vomiting.    Physical Exam BP 138/87   Pulse 64   Temp 98.8 F (37.1 C) (Oral)   Ht 5\' 8"  (1.727 m)   Wt 236 lb 6.4 oz (107.2 kg)   SpO2 98%   BMI 35.94 kg/m  CONSTITUTIONAL: No acute distress HEENT:  Normocephalic, atraumatic, extraocular motion intact. RESPIRATORY:  Lungs are clear, and breath  sounds are equal bilaterally. Normal respiratory effort without pathologic use of accessory muscles. CARDIOVASCULAR: Heart is regular without murmurs, gallops, or rubs. GI: The abdomen is soft, non-distended, non-tender to palpation in any areas.  NEUROLOGIC:  Motor and sensation is grossly normal.  Cranial nerves are grossly intact. PSYCH:  Alert and oriented to person, place and time. Affect is normal.  Labs/Imaging: None recently  Assessment and Plan: This is a 54 y.o. male with history of acute diverticulitis.  --The patient had a prolonged course of his diverticulitis before finally improving, but it was uncomplicated  nonetheless.  He feels a lot better now and has made drastic lifestyle changes. --Discussed with him that my only recommendation for surgery would be based on the long time it took for the diverticulitis to improve.  Looking at prior CT scans, he has one episode from 2014 of very low grade diverticulitis, and has not had any complicated episodes.  He feels so much better now that he is very hesitant to proceed with any surgery and would like to continue this course of conservative management.  I think that's reasonable at this point and I do not see a need for urgent surgical intervention.  Discussed with him the symptoms to look out for in the future and that he can always call us if any issues or concerns in the future. --Follow up prn  Face-to-face time spent with the patient and care providers was 25 minutes, with more than 50% of the time spent counseling, educating, and coordinating care of the patient.     Howie Ill, MD [facility]

## 2020-04-03 NOTE — Patient Instructions (Addendum)
Dr.Piscoya discussed with patient to give our office a call back if he notices symptoms worsen or he would like to proceed with surgical treatment. Follow-up with our office as needed.  Please call and ask to speak with a nurse if you develop questions or concerns.  Diverticulitis  Diverticulitis is when small pockets in your large intestine (colon) get infected or swollen. This causes stomach pain and watery poop (diarrhea). These pouches are called diverticula. They form in people who have a condition called diverticulosis. Follow these instructions at home: Medicines  Take over-the-counter and prescription medicines only as told by your doctor. These include: ? Antibiotics. ? Pain medicines. ? Fiber pills. ? Probiotics. ? Stool softeners.  Do not drive or use heavy machinery while taking prescription pain medicine.  If you were prescribed an antibiotic, take it as told. Do not stop taking it even if you feel better. General instructions   Follow a diet as told by your doctor.  When you feel better, your doctor may tell you to change your diet. You may need to eat a lot of fiber. Fiber makes it easier to poop (have bowel movements). Healthy foods with fiber include: ? Berries. ? Beans. ? Lentils. ? Green vegetables.  Exercise 3 or more times a week. Aim for 30 minutes each time. Exercise enough to sweat and make your heart beat faster.  Keep all follow-up visits as told. This is important. You may need to have an exam of the large intestine. This is called a colonoscopy. Contact a doctor if:  Your pain does not get better.  You have a hard time eating or drinking.  You are not pooping like normal. Get help right away if:  Your pain gets worse.  Your problems do not get better.  Your problems get worse very fast.  You have a fever.  You throw up (vomit) more than one time.  You have poop that is: ? Bloody. ? Black. ? Tarry. Summary  Diverticulitis is when  small pockets in your large intestine (colon) get infected or swollen.  Take medicines only as told by your doctor.  Follow a diet as told by your doctor. This information is not intended to replace advice given to you by your health care provider. Make sure you discuss any questions you have with your health care provider. Document Revised: 07/16/2017 Document Reviewed: 08/20/2016 Elsevier Patient Education  2020 [employer].

## 2020-07-16 NOTE — Progress Notes (Signed)
    Ulah Olmo T. Aleksander Edmiston, MD, CAQ Sports Medicine  Primary Care and Sports Medicine [facility] [address] [place], [address]  Phone: [phone]  FAX: [phone]  KAMAU WEATHERALL - 54 y.o. male  MRN [id number]  Date of Birth: Oct 24, 1965  Date: 07/17/2020  PCP: Joaquim Nam, MD  Referral: Joaquim Nam, MD  Chief Complaint  Patient presents with  . Knee Pain    Bilateral-Left is worse    This visit occurred during the SARS-CoV-2 public health emergency.  Safety protocols were in place, including screening questions prior to the visit, additional usage of staff PPE, and extensive cleaning of exam room while observing appropriate contact time as indicated for disinfecting solutions.   Subjective:   ARLISS FRISINA is a 54 y.o. very pleasant male patient with Body mass index is 36.87 kg/m. who presents with the following:  He presents today in follow-up for bilateral osteoarthritis.  He would like to have some bilateral knee injections today.  Symptoms have flared.  He did have a prior knee arthroscopy with partial medial menisectomy by Dr. Mack Guise in the past.   Procedures only.  Aspiration/Injection Procedure Note FRED FRANZEN 1965-10-12 Date of procedure: 07/17/2020  Procedure: Large Joint Joint Aspiration / Injection of the Right Knee Indications: Pain  Procedure Details Patient verbally consented to procedure. Risks, benefits, and alternatives explained. Sterilely prepped with Chloraprep. Ethyl cholride used for anesthesia. 9 cc Lidocaine 1% mixed with 1 mL Kenalog 40 mg injected using the anteromedial approach without difficulty. No complications with procedure and tolerated well. Patient had decreased pain post-injection.  Medication: 1 mL of Kenalog 40 mg  Aspiration/Injection Procedure Note JEREMIAH CURCI [birth year] Date of procedure: 07/17/2020  Procedure: Large Joint Aspiration / Injection of the Left  Knee Indications: Pain  Procedure Details Patient verbally consented to procedure. Risks, benefits, and alternatives explained. Sterilely prepped with Chloraprep. Ethyl cholride used for anesthesia. 9 cc Lidocaine 1% mixed with 1 mL Kenalog 40 mg injected using the anteromedial approach without difficulty. No complications with procedure and tolerated well. Patient had decreased pain post-injection.  Medication: 1 mL of Kenalog 40 mg  Signed,  Allis Quirarte T. Tykerria Mccubbins, MD

## 2020-07-17 ENCOUNTER — Encounter: Payer: Self-pay | Admitting: Family Medicine

## 2020-07-17 ENCOUNTER — Ambulatory Visit: Payer: Managed Care, Other (non HMO) | Admitting: Family Medicine

## 2020-07-17 ENCOUNTER — Other Ambulatory Visit: Payer: Self-pay

## 2020-07-17 VITALS — BP 140/90 | HR 58 | Temp 97.1°F | Ht 68.0 in | Wt 242.5 lb

## 2020-07-17 DIAGNOSIS — M170 Bilateral primary osteoarthritis of knee: Secondary | ICD-10-CM

## 2020-07-17 MED ORDER — TRIAMCINOLONE ACETONIDE 40 MG/ML IJ SUSP
40.0000 mg | Freq: Once | INTRAMUSCULAR | Status: AC
  Administered 2020-07-17: 40 mg via INTRA_ARTICULAR

## 2020-07-17 NOTE — Addendum Note (Signed)
Addended by: Damita Lack on: 07/17/2020 09:39 AM   Modules accepted: Orders

## 2020-07-18 ENCOUNTER — Telehealth: Payer: Self-pay

## 2020-07-18 ENCOUNTER — Encounter: Payer: Self-pay | Admitting: Family Medicine

## 2020-07-18 NOTE — Telephone Encounter (Signed)
[facility] - Client TELEPHONE ADVICE RECORD [facility] Patient Name: Frank Strickland Gender: Male DOB: 09/08/1965 Age: 54 Y 11 M 14 D Return Phone Number: [phone] (Secondary) Address: City/State/Zip: [place] [address] Client [facility] - Client Client Site [facility] - Day Physician Raechel Ache - MD Contact Type Call Who Is Calling Patient / Member / Family / Caregiver Call Type Triage / Clinical Caller Name Woodrow Drab Relationship To Patient Spouse Return Phone Number [phone] (Secondary) Chief Complaint BREATHING - shortness of breath or sounds breathless Reason for Call Symptomatic / Request for Health Information Initial Comment Caller states her husband had 2 steroid injections yesterday and he seems to have an allergic reaction. Sx include redness in the face, nausea, hiccups, headache, breathless. Translation No Nurse Assessment Nurse: Stefano Gaul, RN, Vera Date/Time (Eastern Time): 07/18/2020 12:54:20 PM Confirm and document reason for call. If symptomatic, describe symptoms. ---Caller states spouse had 2 steroid injections yesterday. One in each knee. has a headache. has hiccups. Has abd pain. Face is red. he is swallowing ok. Does the patient have any new or worsening symptoms? ---Yes Will a triage be completed? ---Yes Related visit to physician within the last 2 weeks? ---No Does the PT have any chronic conditions? (i.e. diabetes, asthma, this includes High risk factors for pregnancy, etc.) ---Yes List chronic conditions. ---arthritis; HTN Is this a behavioral health or substance abuse call? ---No Guidelines Guideline Title Affirmed Question Affirmed Notes Nurse Date/Time (Eastern Time) Breathing Difficulty [1] MILD difficulty breathing (e.g., minimal/ no SOB at rest, SOB with walking, pulse <100) AND [2] NEW-onset or WORSE than normal Stefano Gaul, Charity fundraiser, Dwana Curd 07/18/2020  12:56:37 PM Disp. Time Lamount Cohen Time) Disposition Final User 07/18/2020 12:51:24 PM Send to Urgent Queue Carley Hammed PLEASE NOTE: All timestamps contained within this report are represented as [place] Standard Time. CONFIDENTIALTY NOTICE: This fax transmission is intended only for the addressee. It contains information that is legally privileged, confidential or otherwise protected from use or disclosure. If you are not the intended recipient, you are strictly prohibited from reviewing, disclosing, copying using or disseminating any of this information or taking any action in reliance on or regarding this information. If you have received this fax in error, please notify us immediately by telephone so that we can arrange for its return to [place]. Phone: [phone], Toll-Free: [phone], Fax: [phone] Page: 2 of 2 Call Id: [id number] 07/18/2020 1:05:11 PM Go to ED Now (or PCP triage) Yes Stefano Gaul, RN, Dwana Curd Disposition Overriden: See HCP within 4 Hours (or PCP triage) Override Reason: Patient's symptoms need a higher level of care Caller Disagree/Comply Comply Caller Understands Yes PreDisposition Call Doctor Care Advice Given Per Guideline GO TO ED NOW (OR PCP TRIAGE): * IF NO PCP (PRIMARY CARE PROVIDER) SECOND-LEVEL TRIAGE: You need to be seen within the next hour. Go to the ED/UCC at _____________ Hospital. Leave as soon as you can. * It is better and safer if another adult drives instead of you. ANOTHER ADULT SHOULD DRIVE: Comments User: Art Buff, RN Date/Time Lamount Cohen Time): 07/18/2020 1:03:52 PM Called back line and gave report that pt has had hiccups for 3 hrs and triage outcome of go to ER now (or PCP triage). Wants to know if any appts are available. States no appts are available. Advised spouse that no appts are available and she states she will take him to urgent care. User: Art Buff, RN Date/Time Lamount Cohen Time): 07/18/2020 1:05:04 PM triage outcome  upgraded to go to  ER now (or PCP triage) as pt has been hiccuping for 3 hrs and has SOB. had 2 steroid injections yesterday and his face is red. User: Art Buff, RN Date/Time Lamount Cohen Time): 07/18/2020 1:06:17 PM pt states he is SOB. Referrals GO TO FACILITY UNDECIDED

## 2020-07-18 NOTE — Telephone Encounter (Addendum)
Left v/m requesting Mrs Donnetta Hail (DPR signed) to call [facility]. Mrs Walston called back and I gave Mrs Worrel Dr [place]lands comments and she voiced understanding; Mrs Iqbal said pt is already at [facility] in [place]; pt is hiccuping continuously, his face is red like he has a bad sunburn, and  Pt is breathing OK now; no distress in breathing. Pt is going to be seen at [facility] but Mrs Benish is appreciative of all that we have done for her husband. FYI to Dr Para March as PCP and Dr Patsy Lager. Mrs Schurman will call [facility] back if anything further needed from [place].

## 2020-07-18 NOTE — Telephone Encounter (Signed)
Vm left at triage.  Pt's wife, Aram Beecham (on dpr), states pt was seen by Dr. Patsy Lager yesterday for steroid inj in each knee.  Says pt has had hiccups this morning since 10:00.  Pt also, c/o stomach irritation and HA since 1:00 yesterday.  Concerned sxs may be reaction to knee injections.  Plz advise pt/pt's wife, Aram Beecham at [phone].

## 2020-07-18 NOTE — Telephone Encounter (Signed)
Please see phone note from 07/18/20.

## 2020-07-18 NOTE — Telephone Encounter (Signed)
I hope he starts feeling better soon.    Stomach irritation:  you could get some nausea. Does not happen often  2.   Hiccups:  When I looked this up, there has been one reported case thought to be from a steroid injection. That is maybe 1 in a billion or less.  3.  A headache is possible - not common, but it is possible  Since the steroids enter the knee joint, systemic steroids should be out of system within 3-4 days.  Usually side effects would get better quickly.  Side effects from steroids are much more common when they are taken longer orally or when injected into the spine.

## 2020-07-18 NOTE — Telephone Encounter (Addendum)
Noted. Thanks. Will await UC notes.  

## 2020-07-22 ENCOUNTER — Emergency Department: Payer: Managed Care, Other (non HMO)

## 2020-07-22 ENCOUNTER — Other Ambulatory Visit: Payer: Self-pay

## 2020-07-22 ENCOUNTER — Emergency Department
Admission: EM | Admit: 2020-07-22 | Discharge: 2020-07-22 | Disposition: A | Discharge status: Home / Self Care | Payer: Managed Care, Other (non HMO) | Attending: Emergency Medicine | Admitting: Emergency Medicine

## 2020-07-22 DIAGNOSIS — I2510 Atherosclerotic heart disease of native coronary artery without angina pectoris: Secondary | ICD-10-CM | POA: Insufficient documentation

## 2020-07-22 DIAGNOSIS — Z8711 Personal history of peptic ulcer disease: Secondary | ICD-10-CM | POA: Insufficient documentation

## 2020-07-22 DIAGNOSIS — K859 Acute pancreatitis without necrosis or infection, unspecified: Secondary | ICD-10-CM | POA: Diagnosis not present

## 2020-07-22 DIAGNOSIS — Z87891 Personal history of nicotine dependence: Secondary | ICD-10-CM | POA: Insufficient documentation

## 2020-07-22 DIAGNOSIS — I10 Essential (primary) hypertension: Secondary | ICD-10-CM | POA: Insufficient documentation

## 2020-07-22 DIAGNOSIS — R079 Chest pain, unspecified: Secondary | ICD-10-CM | POA: Diagnosis not present

## 2020-07-22 DIAGNOSIS — R109 Unspecified abdominal pain: Secondary | ICD-10-CM | POA: Diagnosis present

## 2020-07-22 DIAGNOSIS — Z79899 Other long term (current) drug therapy: Secondary | ICD-10-CM | POA: Diagnosis not present

## 2020-07-22 LAB — CBC
HCT: 42.9 % (ref 39.0–52.0)
Hemoglobin: 15.4 g/dL (ref 13.0–17.0)
MCH: 32.9 pg (ref 26.0–34.0)
MCHC: 35.9 g/dL (ref 30.0–36.0)
MCV: 91.7 fL (ref 80.0–100.0)
Platelets: 181 10*3/uL (ref 150–400)
RBC: 4.68 MIL/uL (ref 4.22–5.81)
RDW: 11.1 % — ABNORMAL LOW (ref 11.5–15.5)
WBC: 11.4 10*3/uL — ABNORMAL HIGH (ref 4.0–10.5)
nRBC: 0 % (ref 0.0–0.2)

## 2020-07-22 LAB — HEPATIC FUNCTION PANEL
ALT: 13 U/L (ref 0–44)
AST: 18 U/L (ref 15–41)
Albumin: 3.9 g/dL (ref 3.5–5.0)
Alkaline Phosphatase: 53 U/L (ref 38–126)
Bilirubin, Direct: 0.1 mg/dL (ref 0.0–0.2)
Indirect Bilirubin: 1.1 mg/dL — ABNORMAL HIGH (ref 0.3–0.9)
Total Bilirubin: 1.2 mg/dL (ref 0.3–1.2)
Total Protein: 6.5 g/dL (ref 6.5–8.1)

## 2020-07-22 LAB — BASIC METABOLIC PANEL
Anion gap: 10 (ref 5–15)
BUN: 23 mg/dL — ABNORMAL HIGH (ref 6–20)
CO2: 23 mmol/L (ref 22–32)
Calcium: 8.5 mg/dL — ABNORMAL LOW (ref 8.9–10.3)
Chloride: 100 mmol/L (ref 98–111)
Creatinine, Ser: 0.77 mg/dL (ref 0.61–1.24)
GFR, Estimated: >60 mL/min (ref >60)
Glucose, Bld: 139 mg/dL — ABNORMAL HIGH (ref 70–99)
Potassium: 3.7 mmol/L (ref 3.5–5.1)
Sodium: 133 mmol/L — ABNORMAL LOW (ref 135–145)

## 2020-07-22 LAB — TROPONIN I (HIGH SENSITIVITY)
Troponin I (High Sensitivity): 4 ng/L (ref <18)
Troponin I (High Sensitivity): 4 ng/L (ref <18)

## 2020-07-22 LAB — LIPASE, BLOOD: Lipase: 83 U/L — ABNORMAL HIGH (ref 11–51)

## 2020-07-22 MED ORDER — LACTATED RINGERS IV BOLUS
1000.0000 mL | Freq: Once | INTRAVENOUS | Status: AC
  Administered 2020-07-22: 1000 mL via INTRAVENOUS

## 2020-07-22 MED ORDER — IOHEXOL 350 MG/ML SOLN
100.0000 mL | Freq: Once | INTRAVENOUS | Status: AC | PRN
  Administered 2020-07-22: 100 mL via INTRAVENOUS

## 2020-07-22 MED ORDER — ONDANSETRON HCL 4 MG/2ML IJ SOLN
4.0000 mg | Freq: Once | INTRAMUSCULAR | Status: AC
  Administered 2020-07-22: 4 mg via INTRAVENOUS
  Filled 2020-07-22: qty 2

## 2020-07-22 MED ORDER — PANTOPRAZOLE SODIUM 20 MG PO TBEC: 20.0000 mg | DELAYED_RELEASE_TABLET | Freq: Every day | ORAL | 1 refills | Status: DC

## 2020-07-22 MED ORDER — OXYCODONE-ACETAMINOPHEN 5-325 MG PO TABS
1.0000 | ORAL_TABLET | ORAL | 0 refills | Status: DC | PRN
Start: 2020-07-22 — End: 2020-10-10

## 2020-07-22 MED ORDER — ONDANSETRON 4 MG PO TBDP: 4.0000 mg | ORAL_TABLET | Freq: Three times a day (TID) | ORAL | 0 refills | Status: DC | PRN

## 2020-07-22 MED ORDER — MORPHINE SULFATE (PF) 4 MG/ML IV SOLN
4.0000 mg | Freq: Once | INTRAVENOUS | Status: AC
  Administered 2020-07-22: 4 mg via INTRAVENOUS
  Filled 2020-07-22: qty 1

## 2020-07-22 NOTE — ED Notes (Signed)
E-signature not working at this time. Pt verbalized understanding of D/C instructions, prescriptions and follow up care with no further questions at this time. Pt in NAD and wheeled to lobby at time of D/C.  

## 2020-07-22 NOTE — ED Provider Notes (Signed)
[facility] Emergency Department Provider Note   ____________________________________________   First MD Initiated Contact with Patient 07/22/20 [phone]     (approximate)  I have reviewed the triage vital signs and the nursing notes.   HISTORY  Chief Complaint Medication Reaction    HPI Frank COLALUCA is a 54 y.o. male with past medical history of hypertension, hyperlipidemia, and peptic ulcer disease who presents to the ED complaining of chest and abdominal pain.  Patient reports he has been dealing with pain in his epigastrium and behind his sternum for the past couple of days, which seem to come on after receiving steroid injection in both knees 5 days ago.  Pain has been constant and gradually worsening, is now sharp and severe.  He has had occasional nausea but denies any vomiting, does report a poor appetite.  He has not had any fevers, cough, or shortness of breath and denies any pain or swelling in his legs.  He reports a history of peptic ulcer disease but denies similar symptoms in the past.  He drinks alcohol occasionally but denies any recent heavy drinking.        Past Medical History:  Diagnosis Date  . Allergy, unspecified not elsewhere classified   . Anxiety   . Duodenal ulcer    2014  . Hyperlipidemia   . Hypertension     Patient Active Problem List   Diagnosis Date Noted  . Special screening for malignant neoplasms, colon   . Knee pain 10/25/2019  . 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 Coronary artery calcification seen on CAT scan 08/01/2018  . 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 Advance care planning 12/10/2017  . Wrist pain 10/06/2017  . Positive ANA (antinuclear antibody) 02/05/2017  . Rheumatoid factor positive 02/05/2017  . Positive anti-CCP test 02/05/2017  . Vitamin D deficiency 07/02/2016  . Erectile dysfunction 07/02/2016  . GERD (gastroesophageal reflux disease) 07/02/2016  . Prostatitis 01/17/2016  .  Pulmonary nodule 09/18/2015  . Routine general medical examination at a health care facility 08/17/2012  . HLD (hyperlipidemia) 10/12/2007  . ESSENTIAL HYPERTENSION, BENIGN 10/10/2007  . ANXIETY DEPRESSION 02/22/2007  . Allergy 02/22/2007    Past Surgical History:  Procedure Laterality Date  . COLONOSCOPY WITH PROPOFOL N/A 03/19/2020   Procedure: COLONOSCOPY WITH PROPOFOL;  Surgeon: Midge Minium, MD;  Location: [facility] ENDOSCOPY;  Service: Endoscopy;  Laterality: N/A;  . KIDNEY STONE SURGERY  2002   X 2 cystoscopy with stent    Prior to Admission medications   Medication Sig Start Date End Date Taking? Authorizing Provider  Cholecalciferol 1000 units TBDP Take 1 tablet by mouth daily. 07/01/16   Joaquim Nam, MD  FLUoxetine (PROZAC) 20 MG capsule Take 1 capsule (20 mg total) by mouth daily. 10/23/19   Joaquim Nam, MD  fluticasone Aleda Grana) 50 MCG/ACT nasal spray Place 2 sprays into both nostrils daily. 07/07/17   Fisher, Roselyn Bering, PA-C  hydrochlorothiazide (HYDRODIURIL) 25 MG tablet TAKE 1 TABLET(25 MG) BY MOUTH DAILY 03/20/20   Joaquim Nam, MD  Multiple Vitamin (MULTIVITAMIN) tablet Take 1 tablet by mouth daily.    [provider]  ondansetron (ZOFRAN ODT) 4 MG disintegrating tablet Take 1 tablet (4 mg total) by mouth every 8 (eight) hours as needed for nausea or vomiting. 07/22/20   Chesley Noon, MD  oxyCODONE-acetaminophen (PERCOCET) 5-325 MG tablet Take 1 tablet by mouth every 4 (four) hours as needed for severe pain. 07/22/20 07/22/21  Chesley Noon, MD  pantoprazole (PROTONIX) 20 MG tablet Take 1 tablet (20 mg total) by mouth daily. 07/22/20 07/22/21  Chesley Noon, MD    Allergies Ace inhibitors, Angiotensin receptor blockers, Lisinopril, Amlodipine, Codeine sulfate, Crestor [rosuvastatin calcium], Ketorolac tromethamine, Omeprazole, Pravastatin, and Tessalon [benzonatate]  Family History  Problem Relation Age of Onset  . Allergies Mother   . Cancer Father         lung-smoker  . Heart disease Sister   . Hypertension Sister   . Colon cancer Neg Hx   . Prostate cancer Neg Hx     Social History Social History   Tobacco Use  . Smoking status: Former Smoker    Types: Cigarettes  . Smokeless tobacco: Never Used  . Tobacco comment: quit 1997  Substance Use Topics  . Alcohol use: Yes    Comment: Occasional  . Drug use: No    Review of Systems  Constitutional: No fever/chills Eyes: No visual changes. ENT: No sore throat. Cardiovascular: Positive for chest pain. Respiratory: Denies shortness of breath. Gastrointestinal: Positive for abdominal pain.  Positive for nausea, no vomiting.  No diarrhea.  No constipation. Genitourinary: Negative for dysuria. Musculoskeletal: Negative for back pain. Skin: Negative for rash. Neurological: Negative for headaches, focal weakness or numbness.  ____________________________________________   PHYSICAL EXAM:  VITAL SIGNS: ED Triage Vitals [07/22/20 0758]  Enc Vitals Group     BP 133/85     Pulse Rate 74     Resp 17     Temp 98 F (36.7 C)     Temp Source Oral     SpO2 97 %     Weight 237 lb (107.5 kg)     Height 5\' 8"  (1.727 m)     Head Circumference      Peak Flow      Pain Score 4     Pain Loc      Pain Edu?      Excl. in GC?     Constitutional: Alert and oriented. Eyes: Conjunctivae are normal. Head: Atraumatic. Nose: No congestion/rhinnorhea. Mouth/Throat: Mucous membranes are moist. Neck: Normal ROM Cardiovascular: Normal rate, regular rhythm. Grossly normal heart sounds.  2+ radial and DP pulses bilaterally. Respiratory: Normal respiratory effort.  No retractions. Lungs CTAB. Gastrointestinal: Soft and tender to palpation in the epigastrium with no rebound or guarding. No distention. Genitourinary: deferred Musculoskeletal: No lower extremity tenderness nor edema. Neurologic:  Normal speech and language. No gross focal neurologic deficits are appreciated. Skin:  Skin is  warm, dry and intact. No rash noted. Psychiatric: Mood and affect are normal. Speech and behavior are normal.  ____________________________________________   LABS (all labs ordered are listed, but only abnormal results are displayed)  Labs Reviewed  BASIC METABOLIC PANEL - Abnormal; Notable for the following components:      Result Value   Sodium 133 (*)    Glucose, Bld 139 (*)    BUN 23 (*)    Calcium 8.5 (*)    All other components within normal limits  CBC - Abnormal; Notable for the following components:   WBC 11.4 (*)    RDW 11.1 (*)    All other components within normal limits  HEPATIC FUNCTION PANEL - Abnormal; Notable for the following components:   Indirect Bilirubin 1.1 (*)    All other components within normal limits  LIPASE, BLOOD - Abnormal; Notable for the following components:   Lipase 83 (*)    All other components within normal limits  TROPONIN I (HIGH SENSITIVITY)  TROPONIN I (HIGH SENSITIVITY)   ____________________________________________  EKG  ED ECG REPORT I, Chesley Noon, the attending physician, personally viewed and interpreted this ECG.   Date: 07/22/2020  EKG Time: 8:01  Rate: 68  Rhythm: normal sinus rhythm  Axis: Normal  Intervals:none  ST&T Change: None   PROCEDURES  Procedure(s) performed (including Critical Care):  Procedures   ____________________________________________   INITIAL IMPRESSION / ASSESSMENT AND PLAN / ED COURSE       54 year old male with past medical history of hypertension, hyperlipidemia, and peptic ulcer disease who presents to the ED complaining of constant worsening pain in his epigastrium and center of his chest for the past 2 days.  Pain will occasionally radiate to his back and we will further assess for dissection with CTA but patient with equal pulses in all 4 extremities at this time.  EKG shows no evidence of arrhythmia or ischemia and initial troponin is negative, lower suspicion for ACS.  Chest  x-ray reviewed by me and shows no infiltrate, edema, or effusion.  We will also add on LFTs and lipase.  CTA is negative for aortic emergency, does show mild stranding in the area of the pancreas, which would explain patient's symptoms.  He is also noted to have a mild elevation in lipase at 81, labs otherwise unremarkable.  Patient reports he is feeling better following IV morphine and Zofran, he was now able to tolerate p.o. challenge without difficulty.  Patient reports minimal alcohol intake and no evidence of biliary disease on CT scan or labs.  Source of his pancreatitis is unclear but he is appropriate for discharge home with GI follow-up.  He was counseled to avoid alcohol and to return to the ED for new worsening symptoms.  Patient agrees with plan.      ____________________________________________   FINAL CLINICAL IMPRESSION(S) / ED DIAGNOSES  Final diagnoses:  Acute pancreatitis without infection or necrosis, unspecified pancreatitis type     ED Discharge Orders         Ordered    pantoprazole (PROTONIX) 20 MG tablet  Daily        07/22/20 1340    ondansetron (ZOFRAN ODT) 4 MG disintegrating tablet  Every 8 hours PRN        07/22/20 1340    oxyCODONE-acetaminophen (PERCOCET) 5-325 MG tablet  Every 4 hours PRN        07/22/20 1340           Note:  This document was prepared using Dragon voice recognition software and may include unintentional dictation errors.   Chesley Noon, MD 07/22/20 [phone]

## 2020-07-22 NOTE — ED Notes (Signed)
Pt given crackers and water to PO trial

## 2020-07-22 NOTE — ED Triage Notes (Signed)
Pt states he got 2 steroid injections in BL knees last Wednesday and started with a HA a couple ours later and the next day had hiccups for 10hrs straight. Pt points to epigastric area with pain and right shoulder pain, states he went to the urgent care last week and was told he was having an allergic reaction to the steroid injections. Pt is in NAD . ambulatory with a steady gait and no respiratory distress  Noted.

## 2020-07-22 NOTE — ED Notes (Addendum)
Pt able to tolerate crackers and water. MD Jessup aware.

## 2020-09-12 ENCOUNTER — Ambulatory Visit (INDEPENDENT_AMBULATORY_CARE_PROVIDER_SITE_OTHER): Payer: Managed Care, Other (non HMO) | Admitting: Gastroenterology

## 2020-09-12 ENCOUNTER — Encounter: Payer: Self-pay | Admitting: Gastroenterology

## 2020-09-12 ENCOUNTER — Other Ambulatory Visit: Payer: Self-pay

## 2020-09-12 VITALS — BP 136/78 | HR 66 | Ht 68.0 in | Wt 235.0 lb

## 2020-09-12 DIAGNOSIS — K852 Alcohol induced acute pancreatitis without necrosis or infection: Secondary | ICD-10-CM

## 2020-09-12 NOTE — Progress Notes (Signed)
Primary Care Physician: Joaquim Nam, MD  Primary Gastroenterologist:  Dr. Midge Minium  Chief Complaint  Patient presents with  . Pancreatitis    HPI: Frank Strickland is a 55 y.o. male here the patient reports that approximately a year and a half ago he started drinking liquor and was drinking significant amounts.  He then switched over to beer and was drinking beer with 8 beers a day 3 days a week at least.  The patient states he has not had any drinking of beer since then.  The patient reports that he has had no further abdominal pain and he denies any nausea vomiting fevers or chills.  The patient had done a lot of reading on his own and understands that the alcohol was likely the cause of his problems.  Past Medical History:  Diagnosis Date  . Allergy, unspecified not elsewhere classified   . Anxiety   . Duodenal ulcer    2014  . Hyperlipidemia   . Hypertension     Current Outpatient Medications  Medication Sig Dispense Refill  . Cholecalciferol 1000 units TBDP Take 1 tablet by mouth daily.    Marland Kitchen FLUoxetine (PROZAC) 20 MG capsule Take 1 capsule (20 mg total) by mouth daily. 90 capsule 3  . fluticasone (FLONASE) 50 MCG/ACT nasal spray Place 2 sprays into both nostrils daily. 16 g 6  . hydrochlorothiazide (HYDRODIURIL) 25 MG tablet TAKE 1 TABLET(25 MG) BY MOUTH DAILY 90 tablet 1  . Multiple Vitamin (MULTIVITAMIN) tablet Take 1 tablet by mouth daily.    . ondansetron (ZOFRAN ODT) 4 MG disintegrating tablet Take 1 tablet (4 mg total) by mouth every 8 (eight) hours as needed for nausea or vomiting. 12 tablet 0  . oxyCODONE-acetaminophen (PERCOCET) 5-325 MG tablet Take 1 tablet by mouth every 4 (four) hours as needed for severe pain. 12 tablet 0  . pantoprazole (PROTONIX) 20 MG tablet Take 1 tablet (20 mg total) by mouth daily. 30 tablet 1   No current facility-administered medications for this visit.    Allergies as of 09/12/2020 - Review Complete 09/12/2020  Allergen  Reaction Noted  . Ace inhibitors  01/08/2016  . Angiotensin receptor blockers  01/08/2016  . Lisinopril Other (See Comments) 01/08/2016  . Amlodipine Other (See Comments) 01/16/2016  . Codeine sulfate  02/22/2007  . Crestor [rosuvastatin calcium]  09/02/2018  . Ketorolac tromethamine  02/23/2007  . Omeprazole Other (See Comments) 04/22/2018  . Pravastatin  12/27/2018  . Tessalon [benzonatate] Other (See Comments) 09/17/2015    ROS:  General: Negative for anorexia, weight loss, fever, chills, fatigue, weakness. ENT: Negative for hoarseness, difficulty swallowing , nasal congestion. CV: Negative for chest pain, angina, palpitations, dyspnea on exertion, peripheral edema.  Respiratory: Negative for dyspnea at rest, dyspnea on exertion, cough, sputum, wheezing.  GI: See history of present illness. GU:  Negative for dysuria, hematuria, urinary incontinence, urinary frequency, nocturnal urination.  Endo: Negative for unusual weight change.    Physical Examination:   BP 136/78   Pulse 66   Ht 5\' 8"  (1.727 m)   Wt 235 lb (106.6 kg)   BMI 35.73 kg/m   General: Well-nourished, well-developed in no acute distress.  Eyes: No icterus. Conjunctivae pink. Lungs: Clear to auscultation bilaterally. Non-labored. Heart: Regular rate and rhythm, no murmurs rubs or gallops.  Abdomen: Bowel sounds are normal, nontender, nondistended, no hepatosplenomegaly or masses, no abdominal bruits or hernia , no rebound or guarding.   Extremities: No lower extremity edema. No  clubbing or deformities. Neuro: Alert and oriented x 3.  Grossly intact. Skin: Warm and dry, no jaundice.   Psych: Alert and cooperative, normal mood and affect.  Labs:    Imaging Studies: No results found.  Assessment and Plan:   Frank Strickland is a 55 y.o. y/o male Who comes in today with a history of abdominal pain after about a year and a half of excessive drinking.  The patient has stopped drinking and the abdominal pain had  gone away.  The patient was evaluated for the abdominal pain and was found to have an elevated Lipase and a CT scan showing mild PEG has.  The patient is now pain-free and having no issues related to pancreatitis without any steatorrhea or unexplained weight loss.  The patient has been told to continue his abstinence from alcohol and follow-up with me as needed.  The patient has The plan and agrees with it.     Midge Minium, MD. Clementeen Graham    Note: This dictation was prepared with Dragon dictation along with smaller phrase technology. Any transcriptional errors that result from this process are unintentional.

## 2020-10-10 ENCOUNTER — Ambulatory Visit (INDEPENDENT_AMBULATORY_CARE_PROVIDER_SITE_OTHER)
Admission: RE | Admit: 2020-10-10 | Discharge: 2020-10-10 | Disposition: A | Discharge status: Home / Self Care | Payer: Managed Care, Other (non HMO) | Source: Ambulatory Visit | Attending: Family Medicine | Admitting: Family Medicine

## 2020-10-10 ENCOUNTER — Ambulatory Visit: Payer: Managed Care, Other (non HMO) | Admitting: Family Medicine

## 2020-10-10 ENCOUNTER — Other Ambulatory Visit: Payer: Self-pay

## 2020-10-10 ENCOUNTER — Encounter: Payer: Self-pay | Admitting: Family Medicine

## 2020-10-10 VITALS — BP 120/84 | HR 60 | Temp 97.7°F | Ht 68.0 in | Wt 236.2 lb

## 2020-10-10 DIAGNOSIS — M25562 Pain in left knee: Secondary | ICD-10-CM | POA: Diagnosis not present

## 2020-10-10 DIAGNOSIS — G8929 Other chronic pain: Secondary | ICD-10-CM

## 2020-10-10 DIAGNOSIS — M1712 Unilateral primary osteoarthritis, left knee: Secondary | ICD-10-CM | POA: Diagnosis not present

## 2020-10-10 MED ORDER — TRIAMCINOLONE ACETONIDE 40 MG/ML IJ SUSP
40.0000 mg | Freq: Once | INTRAMUSCULAR | Status: AC
  Administered 2020-10-10: 40 mg via INTRA_ARTICULAR

## 2020-10-10 NOTE — Progress Notes (Signed)
Nissan Frazzini T. Dymin Dingledine, MD, CAQ Sports Medicine  Primary Care and Sports Medicine [facility] [address] [place], [address]  Phone: [phone]  FAX: [phone]  Frank Strickland - 55 y.o. male  MRN [id number]  Date of Birth: Jan 12, 1966  Date: 10/10/2020  PCP: Joaquim Nam, MD  Referral: Joaquim Nam, MD  Chief Complaint  Patient presents with  . Knee Pain    Left    This visit occurred during the SARS-CoV-2 public health emergency.  Safety protocols were in place, including screening questions prior to the visit, additional usage of staff PPE, and extensive cleaning of exam room while observing appropriate contact time as indicated for disinfecting solutions.   Subjective:   Frank Strickland is a 55 y.o. very pleasant male patient with Body mass index is 35.92 kg/m. who presents with the following:  L knee: bone on bone OA, worsening over time  I recall Frank Strickland well.  He has a known history of left-sided degenerative joint disease.  He does have a history of having a arthroscopy distantly with a partial meniscectomy.  Right now he does have some pain in the knee and it bothers him from a functional standpoint and he has lost motion as well.  He does have intermittent effusions.  He has medial and lateral joint pain with the worst pain on the medial compartment.  He is tried many things including multiple over-the-counter medications oral NSAIDs, Tylenol, intra-articular injections, and he has done a round of hyaluronic acid injections previously.  He is very physical and active usually.  Review of Systems is noted in the HPI, as appropriate   Objective:   BP 120/84   Pulse 60   Temp 97.7 F (36.5 C) (Temporal)   Ht 5\' 8"  (1.727 m)   Wt 236 lb 4 oz (107.2 kg)   SpO2 98%   BMI 35.92 kg/m   GEN: No acute distress; alert,appropriate. PULM: Breathing comfortably in no respiratory distress PSYCH: Normally interactive.     Left knee: Full extension.  Flexion to 110.  Stable to varus and valgus stress.  No significant effusion.  Does have pain with loading the medial and lateral patellar facets.  Quite extensive medial and lateral joint line tenderness.  ACL and PCL are intact.  Any form of forced flexion or extension causes significant pain.  This includes any form of bounce home testing, flexion pinch testing or McMurray's without mechanical symptoms.  Relative diffuse pain with any manipulation of the knee.  Radiology: No results found.   Assessment and Plan:     ICD-10-CM   1. Primary osteoarthritis of left knee  M17.12 triamcinolone acetonide (KENALOG-40) injection 40 mg  2. Chronic pain of left knee  M25.562 DG Knee 4 Views W/Patella Left   G89.29    Progressive knee osteoarthritis worsening over time based on his prior radiographs.  At this point he does have end-stage degenerative joint disease in the medial compartment with global tricompartmental osteoarthritis.  We reviewed different treatment options, and at this point doing an intra-articular injection of some steroids seems appropriate.  Depending on his response, another round of hyaluronic acid would be reasonable and appropriate prior to think of any form of surgery.  Aspiration/Injection Procedure Note Frank Strickland [birth year] Date of procedure: 10/10/2020  Procedure: Large Joint Aspiration / Injection of Knee, L Indications: Pain  Procedure Details Patient verbally consented to procedure. Risks, benefits, and alternatives explained. Sterilely prepped  with Chloraprep. Ethyl cholride used for anesthesia. 9 cc Lidocaine 1% mixed with 1 mL of Kenalog 40 mg injected using the anteromedial approach without difficulty. No complications with procedure and tolerated well. Patient had decreased pain post-injection. Medication: 1 mL of Kenalog 40 mg  Meds ordered this encounter  Medications  . triamcinolone acetonide (KENALOG-40) injection 40  mg   Medications Discontinued During This Encounter  Medication Reason  . oxyCODONE-acetaminophen (PERCOCET) 5-325 MG tablet No longer needed (for PRN medications)   Orders Placed This Encounter  Procedures  . DG Knee 4 Views W/Patella Left    Follow-up: No follow-ups on file.  Signed,  Elpidio Galea. Hamdi Kley, MD   Outpatient Encounter Medications as of 10/10/2020  Medication Sig  . Cholecalciferol 1000 units TBDP Take 1 tablet by mouth daily.  Marland Kitchen FLUoxetine (PROZAC) 20 MG capsule Take 1 capsule (20 mg total) by mouth daily.  . hydrochlorothiazide (HYDRODIURIL) 25 MG tablet TAKE 1 TABLET(25 MG) BY MOUTH DAILY  . Multiple Vitamin (MULTIVITAMIN) tablet Take 1 tablet by mouth daily.  . ondansetron (ZOFRAN ODT) 4 MG disintegrating tablet Take 1 tablet (4 mg total) by mouth every 8 (eight) hours as needed for nausea or vomiting.  . pantoprazole (PROTONIX) 20 MG tablet Take 1 tablet (20 mg total) by mouth daily.  . [DISCONTINUED] fluticasone (FLONASE) 50 MCG/ACT nasal spray Place 2 sprays into both nostrils daily.  . [DISCONTINUED] oxyCODONE-acetaminophen (PERCOCET) 5-325 MG tablet Take 1 tablet by mouth every 4 (four) hours as needed for severe pain.  . [EXPIRED] triamcinolone acetonide (KENALOG-40) injection 40 mg    No facility-administered encounter medications on file as of 10/10/2020.

## 2020-10-11 ENCOUNTER — Telehealth: Payer: Managed Care, Other (non HMO) | Admitting: Nurse Practitioner

## 2020-10-11 ENCOUNTER — Encounter: Payer: Self-pay | Admitting: Nurse Practitioner

## 2020-10-11 DIAGNOSIS — J209 Acute bronchitis, unspecified: Secondary | ICD-10-CM | POA: Diagnosis not present

## 2020-10-11 MED ORDER — PROMETHAZINE-DM 6.25-15 MG/5ML PO SYRP: 5.0000 mL | ORAL_SOLUTION | Freq: Four times a day (QID) | ORAL | 0 refills | Status: DC | PRN

## 2020-10-11 MED ORDER — AZITHROMYCIN 250 MG PO TABS: ORAL_TABLET | ORAL | 0 refills | Status: DC

## 2020-10-11 MED ORDER — FLUTICASONE PROPIONATE 50 MCG/ACT NA SUSP: 2.0000 | Freq: Every day | NASAL | 6 refills | Status: AC

## 2020-10-11 NOTE — Patient Instructions (Signed)

## 2020-10-11 NOTE — Progress Notes (Signed)
Virtual Visit via Video Note  I connected with Frank Strickland on 10/11/20 at  9:30 AM EST by a video enabled telemedicine application and verified that I am speaking with the correct person using two identifiers.  Location: Patient: home Provider: [facility]   I discussed the limitations of evaluation and management by telemedicine. I discussed that due to the clinic being inside the hospital and patient's symptoms availability of in person appointment would not be possible due to Covid symptom restrictions. The patient expressed understanding and agreed to proceed.  History of Present Illness: Frank Strickland is a 55 y.o. male with c/o productive cough, yellow, ear pressure,congestion and sinus pressure and frontal headache that started 2 days ago. The only OTC medication he is taking is Tylenol for aching. Patient reports he has had his Covid Vaccines but did not get his flu vaccine this year. He has a hx of hypertension that he reports is well controlled. He denies fever but reports some chills. No one at home is sick and he does not remember being around anyone that was sick. Patient denies abdominal pain, n/v/d. He denies SOB or chest pain.    Past Medical History:  Diagnosis Date  . Allergy, unspecified not elsewhere classified   . Anxiety   . Duodenal ulcer    2014  . Hyperlipidemia   . Hypertension    Past Surgical History:  Procedure Laterality Date  . COLONOSCOPY WITH PROPOFOL N/A 03/19/2020   Procedure: COLONOSCOPY WITH PROPOFOL;  Surgeon: Midge Minium, MD;  Location: [facility] ENDOSCOPY;  Service: Endoscopy;  Laterality: N/A;  . KIDNEY STONE SURGERY  2002   X 2 cystoscopy with stent   Current Outpatient Medications on File Prior to Visit  Medication Sig Dispense Refill  . Cholecalciferol 1000 units TBDP Take 1 tablet by mouth daily.    Marland Kitchen FLUoxetine (PROZAC) 20 MG capsule Take 1 capsule (20 mg total) by mouth daily. 90 capsule 3  . fluticasone (FLONASE) 50 MCG/ACT nasal spray Place  2 sprays into both nostrils daily. 16 g 6  . hydrochlorothiazide (HYDRODIURIL) 25 MG tablet TAKE 1 TABLET(25 MG) BY MOUTH DAILY 90 tablet 1  . Multiple Vitamin (MULTIVITAMIN) tablet Take 1 tablet by mouth daily.    . ondansetron (ZOFRAN ODT) 4 MG disintegrating tablet Take 1 tablet (4 mg total) by mouth every 8 (eight) hours as needed for nausea or vomiting. 12 tablet 0  . pantoprazole (PROTONIX) 20 MG tablet Take 1 tablet (20 mg total) by mouth daily. 30 tablet 1   No current facility-administered medications on file prior to visit.   Allergies  Allergen Reactions  . Ace Inhibitors   . Angiotensin Receptor Blockers   . Lisinopril Other (See Comments)    Presumed cause of lip swelling  . Amlodipine Other (See Comments)    intolerant  . Codeine Sulfate     REACTION: headache  . Crestor [Rosuvastatin Calcium]     Aches with 10mg  daily  . Ketorolac Tromethamine     REACTION: severe headache  . Omeprazole Other (See Comments)    Muscle aches per patient   . Pravastatin     aches  . Tessalon [Benzonatate] Other (See Comments)    headache     Observations/Objective: Patient reports no fever and normal BP. He is alert and oriented and in no acute distress. He is able to speak in full sentences. He does not appear short of breath. Sclera is clear and normal occular movement. He is moving  his neck without pain. I did not hear him cough during over virtual visit, however, he reports that when he does cough it is productive with yellow sputum and is worse at night. No wheezing heard. Patient reports he has had same in the patient that responded to treatment with z-pak.   Assessment and Plan: 55 y.o. male with 2 day hx of productive cough and congestion. Hx of same in the past with bronchitis. Will Rx Z-pak, Phenergan expectorant, and Flonase.   Follow Up Instructions:   I discussed the assessment and treatment plan with the patient. The patient was provided an opportunity to ask questions  and all were answered. The patient agreed with the plan and demonstrated an understanding of the instructions.   The patient was advised to call back or seek an in-person evaluation if the symptoms worsen or if the condition fails to improve as anticipated.  I provided 15 minutes of non-face-to-face time during this encounter.   Kerrie Buffalo, NP

## 2020-10-19 ENCOUNTER — Other Ambulatory Visit: Payer: Self-pay | Admitting: Family Medicine

## 2020-10-19 DIAGNOSIS — E559 Vitamin D deficiency, unspecified: Secondary | ICD-10-CM

## 2020-10-19 DIAGNOSIS — I10 Essential (primary) hypertension: Secondary | ICD-10-CM

## 2020-10-28 ENCOUNTER — Other Ambulatory Visit (INDEPENDENT_AMBULATORY_CARE_PROVIDER_SITE_OTHER): Payer: Managed Care, Other (non HMO)

## 2020-10-28 ENCOUNTER — Other Ambulatory Visit: Payer: Self-pay

## 2020-10-28 DIAGNOSIS — I10 Essential (primary) hypertension: Secondary | ICD-10-CM

## 2020-10-28 DIAGNOSIS — E559 Vitamin D deficiency, unspecified: Secondary | ICD-10-CM

## 2020-10-28 LAB — COMPREHENSIVE METABOLIC PANEL
ALT: 12 U/L (ref 0–53)
AST: 22 U/L (ref 0–37)
Albumin: 4.1 g/dL (ref 3.5–5.2)
Alkaline Phosphatase: 58 U/L (ref 39–117)
BUN: 20 mg/dL (ref 6–23)
CO2: 32 mEq/L (ref 19–32)
Calcium: 9.3 mg/dL (ref 8.4–10.5)
Chloride: 101 mEq/L (ref 96–112)
Creatinine, Ser: 0.81 mg/dL (ref 0.40–1.50)
GFR: 98.75 mL/min (ref >60.00)
Glucose, Bld: 86 mg/dL (ref 70–99)
Potassium: 4.3 mEq/L (ref 3.5–5.1)
Sodium: 138 mEq/L (ref 135–145)
Total Bilirubin: 1.8 mg/dL — ABNORMAL HIGH (ref 0.2–1.2)
Total Protein: 6.7 g/dL (ref 6.0–8.3)

## 2020-10-28 LAB — CBC WITH DIFFERENTIAL/PLATELET
Basophils Absolute: 0.1 10*3/uL (ref 0.0–0.1)
Basophils Relative: 0.8 % (ref 0.0–3.0)
Eosinophils Absolute: 0.1 10*3/uL (ref 0.0–0.7)
Eosinophils Relative: 1.7 % (ref 0.0–5.0)
HCT: 43.4 % (ref 39.0–52.0)
Hemoglobin: 15.1 g/dL (ref 13.0–17.0)
Lymphocytes Relative: 36.3 % (ref 12.0–46.0)
Lymphs Abs: 2.3 10*3/uL (ref 0.7–4.0)
MCHC: 34.8 g/dL (ref 30.0–36.0)
MCV: 95.4 fl (ref 78.0–100.0)
Monocytes Absolute: 0.5 10*3/uL (ref 0.1–1.0)
Monocytes Relative: 8.4 % (ref 3.0–12.0)
Neutro Abs: 3.4 10*3/uL (ref 1.4–7.7)
Neutrophils Relative %: 52.8 % (ref 43.0–77.0)
Platelets: 181 10*3/uL (ref 150.0–400.0)
RBC: 4.55 Mil/uL (ref 4.22–5.81)
RDW: 12.2 % (ref 11.5–15.5)
WBC: 6.5 10*3/uL (ref 4.0–10.5)

## 2020-10-28 LAB — LIPID PANEL
Cholesterol: 239 mg/dL — ABNORMAL HIGH (ref 0–200)
HDL: 54.4 mg/dL (ref >39.00)
LDL Cholesterol: 164 mg/dL — ABNORMAL HIGH (ref 0–99)
NonHDL: 185.06
Total CHOL/HDL Ratio: 4
Triglycerides: 105 mg/dL (ref 0.0–149.0)
VLDL: 21 mg/dL (ref 0.0–40.0)

## 2020-10-28 LAB — VITAMIN D 25 HYDROXY (VIT D DEFICIENCY, FRACTURES): VITD: 49.02 ng/mL (ref 30.00–100.00)

## 2020-11-04 ENCOUNTER — Encounter: Payer: Self-pay | Admitting: Family Medicine

## 2020-11-04 ENCOUNTER — Other Ambulatory Visit: Payer: Self-pay

## 2020-11-04 ENCOUNTER — Ambulatory Visit (INDEPENDENT_AMBULATORY_CARE_PROVIDER_SITE_OTHER): Payer: Managed Care, Other (non HMO) | Admitting: Family Medicine

## 2020-11-04 VITALS — BP 124/76 | HR 52 | Temp 97.9°F | Ht 68.0 in | Wt 230.0 lb

## 2020-11-04 DIAGNOSIS — Z7189 Other specified counseling: Secondary | ICD-10-CM

## 2020-11-04 DIAGNOSIS — Z23 Encounter for immunization: Secondary | ICD-10-CM

## 2020-11-04 DIAGNOSIS — I10 Essential (primary) hypertension: Secondary | ICD-10-CM

## 2020-11-04 DIAGNOSIS — F419 Anxiety disorder, unspecified: Secondary | ICD-10-CM

## 2020-11-04 DIAGNOSIS — E785 Hyperlipidemia, unspecified: Secondary | ICD-10-CM

## 2020-11-04 DIAGNOSIS — R110 Nausea: Secondary | ICD-10-CM

## 2020-11-04 DIAGNOSIS — Z0000 Encounter for general adult medical examination without abnormal findings: Secondary | ICD-10-CM | POA: Diagnosis not present

## 2020-11-04 DIAGNOSIS — K649 Unspecified hemorrhoids: Secondary | ICD-10-CM

## 2020-11-04 DIAGNOSIS — N419 Inflammatory disease of prostate, unspecified: Secondary | ICD-10-CM

## 2020-11-04 MED ORDER — HYDROCORTISONE ACETATE 30 MG RE SUPP: RECTAL | 4 refills | Status: DC

## 2020-11-04 MED ORDER — HYDROCHLOROTHIAZIDE 25 MG PO TABS
ORAL_TABLET | ORAL | 3 refills | Status: DC
Start: 2020-11-04 — End: 2021-11-12

## 2020-11-04 MED ORDER — ONDANSETRON 4 MG PO TBDP
4.0000 mg | ORAL_TABLET | Freq: Three times a day (TID) | ORAL | 2 refills | Status: DC | PRN
Start: 2020-11-04 — End: 2022-03-10

## 2020-11-04 MED ORDER — FLUOXETINE HCL 20 MG PO CAPS
20.0000 mg | ORAL_CAPSULE | Freq: Every day | ORAL | 3 refills | Status: DC
Start: 2020-11-04 — End: 2021-11-07

## 2020-11-04 NOTE — Patient Instructions (Addendum)
Repeat shingles shot in at least 2 months at a nurse visit.  I put in the referral to the lipid clinic.  Take care.  Glad to see you.

## 2020-11-04 NOTE — Progress Notes (Signed)
This visit occurred during the SARS-CoV-2 public health emergency.  Safety protocols were in place, including screening questions prior to the visit, additional usage of staff PPE, and extensive cleaning of exam room while observing appropriate contact time as indicated for disinfecting solutions.  CPE- See plan.  Routine anticipatory guidance given to patient.  See health maintenance.  The possibility exists that previously documented standard health maintenance information may have been brought forward from a previous encounter into this note.  If needed, that same information has been updated to reflect the current situation based on today's encounter.    Tetanus shot done 2020 Flu done yearly PNA not due d/w pt.  Shingles d/w pt.  covid vaccine done Living will d/w pt. Would have his wife designated if patient were incapacitated.  Prostate cancer screening and PSA options(with potential risks and benefits of testing vs not testing) were discussed along with recent recs/guidelines. He declined testing PSAat this point. Colonoscopy 2021 Diet and exercise d/w pt. Labs d/w pt.  HAV and HBV vaccine prev done through work.  H/o prostatitis.  No burning with urination.  He is better now.  We talked about not checking a PSA because I did not want to get a false positive.  Hypertension:    Using medication without problems or lightheadedness: yes Chest pain with exertion:no Edema:no Short of breath:no  HLD.  Statin intolerant.  Lipids still up in spite of diet changes.  We talked about options and lipid clinic referral.  Referral placed.  Anxiety.  Improved with prozac.  No ADE on med.  Compliant.  He failed taper from med.  He felt better on med.    D/w pt about prev diverticulitis.  He needed refill on zofran (used rarely/prn with relief) and suppositories for hemorrhoids.  He is off PPI.  No abd pain now.    PMH and SH reviewed  Meds, vitals, and allergies reviewed.   ROS: Per  HPI.  Unless specifically indicated otherwise in HPI, the patient denies:  General: fever. Eyes: acute vision changes ENT: sore throat Cardiovascular: chest pain Respiratory: SOB GI: vomiting GU: dysuria Musculoskeletal: acute back pain Derm: acute rash Neuro: acute motor dysfunction Psych: worsening mood Endocrine: polydipsia Heme: bleeding Allergy: hayfever  GEN: nad, alert and oriented HEENT: NCAT NECK: supple w/o LA CV: rrr. PULM: ctab, no inc wob ABD: soft, +bs, not tender. EXT: no edema SKIN: no acute rash

## 2020-11-06 DIAGNOSIS — K649 Unspecified hemorrhoids: Secondary | ICD-10-CM | POA: Insufficient documentation

## 2020-11-06 DIAGNOSIS — R110 Nausea: Secondary | ICD-10-CM | POA: Insufficient documentation

## 2020-11-06 NOTE — Assessment & Plan Note (Signed)
Rare need for Zofran, use as needed and update me as needed.  He agrees.

## 2020-11-06 NOTE — Assessment & Plan Note (Signed)
Living will d/w pt.   Would have his wife designated if patient were incapacitated.  

## 2020-11-06 NOTE — Assessment & Plan Note (Signed)
Continue work on diet and exercise.  Continue hydrochlorothiazide.  Labs discussed with patient.

## 2020-11-06 NOTE — Assessment & Plan Note (Signed)
No symptoms now.  I will defer.  He agrees.

## 2020-11-06 NOTE — Assessment & Plan Note (Signed)
Can you suppositories as needed.

## 2020-11-06 NOTE — Assessment & Plan Note (Signed)
Improved with prozac.  No ADE on med.  Compliant.  He failed taper from med.  He felt better on med.   Would continue Prozac.

## 2020-11-06 NOTE — Assessment & Plan Note (Signed)
Tetanus shot done 2020 Flu done yearly PNA not due d/w pt.  Shingles d/w pt.  covid vaccine done Living will d/w pt. Would have his wife designated if patient were incapacitated.  Prostate cancer screening and PSA options(with potential risks and benefits of testing vs not testing) were discussed along with recent recs/guidelines. He declined testing PSAat this point. Colonoscopy 2021 Diet and exercise d/w pt. Labs d/w pt.  HAV and HBV vaccine prev done through work.

## 2020-11-06 NOTE — Assessment & Plan Note (Signed)
He had a working on diet.  Statin intolerant.  Refer to lipid clinic.

## 2020-12-13 ENCOUNTER — Ambulatory Visit: Payer: Managed Care, Other (non HMO) | Admitting: Family Medicine

## 2020-12-13 ENCOUNTER — Encounter: Payer: Self-pay | Admitting: Family Medicine

## 2020-12-13 ENCOUNTER — Other Ambulatory Visit: Payer: Self-pay

## 2020-12-13 DIAGNOSIS — M25569 Pain in unspecified knee: Secondary | ICD-10-CM

## 2020-12-13 MED ORDER — MELOXICAM 15 MG PO TABS
7.5000 mg | ORAL_TABLET | Freq: Every day | ORAL | 3 refills | Status: DC | PRN
Start: 2020-12-13 — End: 2021-11-17

## 2020-12-13 NOTE — Patient Instructions (Addendum)
No charge for visit.  Take care.  Glad to see you. Ask the front about seeing Dr. Patsy Lager specifically for injection.   Try meloxicam but not with aleve.

## 2020-12-13 NOTE — Progress Notes (Signed)
This visit occurred during the SARS-CoV-2 public health emergency.  Safety protocols were in place, including screening questions prior to the visit, additional usage of staff PPE, and extensive cleaning of exam room while observing appropriate contact time as indicated for disinfecting solutions.  I apologized to patient about the situation re: scheduling.  I don't typically do injections in joints, d/w pt.  He understood and accepted my explanation.    Knee pain d/w pt.  He is putting up with pain.  Injections help temporarily.  He usually had injections done about 1-2 times per year.  He is taking aleve vs tylenol some of the time, alternating, with some relief.  Medial L knee pain with squatting/twisting.  xrays d/w pt.  Worse with weather change.  He is icing.  He has a sleeve to use.  More pain walking steps.    He has tolerated aleve- nsaid cautions d/w pt.  I expect him to be able to tolerate meloxicam.    Meds, vitals, and allergies reviewed.   ROS: Per HPI unless specifically indicated in ROS section   GEN: nad, alert and oriented Left knee tender to palpation medially.  I did not put him through an extensive exam because his knee was already tender and it did not make sense to increase his level of discomfort.  He agreed.

## 2020-12-15 NOTE — Assessment & Plan Note (Signed)
Reasonable to try meloxicam with food with routine NSAID cautions discussed with patient.  Stop other NSAIDs.  He will ask the front about scheduling a visit with Dr. Patsy Lager.  No charge for visit.  He agrees with plan.

## 2021-01-07 ENCOUNTER — Ambulatory Visit: Payer: Managed Care, Other (non HMO)

## 2021-01-15 ENCOUNTER — Encounter: Payer: Self-pay | Admitting: Surgery

## 2021-01-15 ENCOUNTER — Other Ambulatory Visit: Payer: Self-pay

## 2021-01-15 ENCOUNTER — Encounter: Payer: Self-pay | Admitting: Emergency Medicine

## 2021-01-15 ENCOUNTER — Emergency Department
Admission: EM | Admit: 2021-01-15 | Discharge: 2021-01-15 | Disposition: A | Discharge status: Home / Self Care | Payer: Managed Care, Other (non HMO) | Attending: Emergency Medicine | Admitting: Emergency Medicine

## 2021-01-15 ENCOUNTER — Emergency Department: Payer: Managed Care, Other (non HMO)

## 2021-01-15 DIAGNOSIS — R300 Dysuria: Secondary | ICD-10-CM | POA: Diagnosis not present

## 2021-01-15 DIAGNOSIS — I10 Essential (primary) hypertension: Secondary | ICD-10-CM | POA: Diagnosis not present

## 2021-01-15 DIAGNOSIS — R1032 Left lower quadrant pain: Secondary | ICD-10-CM | POA: Diagnosis present

## 2021-01-15 DIAGNOSIS — Z87891 Personal history of nicotine dependence: Secondary | ICD-10-CM | POA: Insufficient documentation

## 2021-01-15 DIAGNOSIS — K219 Gastro-esophageal reflux disease without esophagitis: Secondary | ICD-10-CM | POA: Insufficient documentation

## 2021-01-15 DIAGNOSIS — Z79899 Other long term (current) drug therapy: Secondary | ICD-10-CM | POA: Insufficient documentation

## 2021-01-15 DIAGNOSIS — K5792 Diverticulitis of intestine, part unspecified, without perforation or abscess without bleeding: Secondary | ICD-10-CM | POA: Diagnosis not present

## 2021-01-15 LAB — URINALYSIS, COMPLETE (UACMP) WITH MICROSCOPIC
Bacteria, UA: NONE SEEN
Bilirubin Urine: NEGATIVE
Glucose, UA: NEGATIVE mg/dL
Hgb urine dipstick: NEGATIVE
Ketones, ur: NEGATIVE mg/dL
Leukocytes,Ua: NEGATIVE
Nitrite: NEGATIVE
Protein, ur: NEGATIVE mg/dL
Specific Gravity, Urine: 1.017 (ref 1.005–1.030)
Squamous Epithelial / HPF: NONE SEEN (ref 0–5)
pH: 7 (ref 5.0–8.0)

## 2021-01-15 LAB — LIPASE, BLOOD: Lipase: 28 U/L (ref 11–51)

## 2021-01-15 LAB — COMPREHENSIVE METABOLIC PANEL
ALT: 12 U/L (ref 0–44)
AST: 20 U/L (ref 15–41)
Albumin: 3.9 g/dL (ref 3.5–5.0)
Alkaline Phosphatase: 53 U/L (ref 38–126)
Anion gap: 8 (ref 5–15)
BUN: 18 mg/dL (ref 6–20)
CO2: 27 mmol/L (ref 22–32)
Calcium: 8.9 mg/dL (ref 8.9–10.3)
Chloride: 103 mmol/L (ref 98–111)
Creatinine, Ser: 0.71 mg/dL (ref 0.61–1.24)
GFR, Estimated: >60 mL/min (ref >60)
Glucose, Bld: 97 mg/dL (ref 70–99)
Potassium: 4.3 mmol/L (ref 3.5–5.1)
Sodium: 138 mmol/L (ref 135–145)
Total Bilirubin: 2 mg/dL — ABNORMAL HIGH (ref 0.3–1.2)
Total Protein: 6.8 g/dL (ref 6.5–8.1)

## 2021-01-15 LAB — CBC
HCT: 39.7 % (ref 39.0–52.0)
Hemoglobin: 14.5 g/dL (ref 13.0–17.0)
MCH: 33.8 pg (ref 26.0–34.0)
MCHC: 36.5 g/dL — ABNORMAL HIGH (ref 30.0–36.0)
MCV: 92.5 fL (ref 80.0–100.0)
Platelets: 159 10*3/uL (ref 150–400)
RBC: 4.29 MIL/uL (ref 4.22–5.81)
RDW: 11.3 % — ABNORMAL LOW (ref 11.5–15.5)
WBC: 9.3 10*3/uL (ref 4.0–10.5)
nRBC: 0 % (ref 0.0–0.2)

## 2021-01-15 MED ORDER — TRAMADOL HCL 50 MG PO TABS
50.0000 mg | ORAL_TABLET | Freq: Four times a day (QID) | ORAL | 0 refills | Status: DC | PRN
Start: 2021-01-15 — End: 2021-04-04

## 2021-01-15 MED ORDER — AMOXICILLIN-POT CLAVULANATE 875-125 MG PO TABS
1.0000 | ORAL_TABLET | Freq: Once | ORAL | Status: AC
  Administered 2021-01-15: 1 via ORAL
  Filled 2021-01-15: qty 1

## 2021-01-15 MED ORDER — TRAMADOL HCL 50 MG PO TABS
50.0000 mg | ORAL_TABLET | Freq: Once | ORAL | Status: AC
  Administered 2021-01-15: 50 mg via ORAL
  Filled 2021-01-15: qty 1

## 2021-01-15 MED ORDER — AMOXICILLIN-POT CLAVULANATE 875-125 MG PO TABS: 1.0000 | ORAL_TABLET | Freq: Two times a day (BID) | ORAL | 0 refills | Status: DC

## 2021-01-15 NOTE — ED Triage Notes (Signed)
Pt comes into the ED via POV c/o lower abdominal pain.  Pt states he has a h/o diverticulitis but he also feels like he is retaining his urine.  Pt states the pain has been ongoing x 3 days.  Pt states last full void was 3:00am this morning.  Pt described the pain as sharp and radiating through to the right flank.  Pt denies any vomiting or diarrhea, but states he does have some nausea.  Pt ambulatory to triage at this time and in NAD.

## 2021-01-15 NOTE — ED Notes (Signed)
See triage note, pt to ED for lower abd pain x3 days. Hx diverticulitis. +nausea. Denies V/D.  Pt to CT

## 2021-01-15 NOTE — ED Provider Notes (Addendum)
[facility] Emergency Department Provider Note  Time seen: 8:12 AM  I have reviewed the triage vital signs and the nursing notes.   HISTORY  Chief Complaint Abdominal Pain and Urinary Retention   HPI Frank Strickland is a 55 y.o. male with a past medical history of diverticulitis, hypertension, hyperlipidemia, presents emergency department for left lower quadrant abdominal pain.  According to the patient over the past 2 weeks or so he has been experiencing intermittent dull pain across the lower abdomen states it is getting worse and now he feels bloated.  Also states mild dysuria on review of systems.  Denies any black or bloody stools.  States nausea but no vomiting.  No fever.  Describes abdominal pain as moderate dull pain.   Past Medical History:  Diagnosis Date  . Allergy, unspecified not elsewhere classified   . Anxiety   . Duodenal ulcer    2014  . Hyperlipidemia   . Hypertension     Patient Active Problem List   Diagnosis Date Noted  . Hemorrhoids 11/06/2020  . Nausea 11/06/2020  . Special screening for malignant neoplasms, colon   . Knee pain 10/25/2019  . 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 Coronary artery calcification seen on CAT scan 08/01/2018  . 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 Advance care planning 12/10/2017  . Wrist pain 10/06/2017  . Positive ANA (antinuclear antibody) 02/05/2017  . Rheumatoid factor positive 02/05/2017  . Positive anti-CCP test 02/05/2017  . Vitamin D deficiency 07/02/2016  . Erectile dysfunction 07/02/2016  . GERD (gastroesophageal reflux disease) 07/02/2016  . Prostatitis 01/17/2016  . Pulmonary nodule 09/18/2015  . Routine general medical examination at a health care facility 08/17/2012  . HLD (hyperlipidemia) 10/12/2007  . ESSENTIAL HYPERTENSION, BENIGN 10/10/2007  . Anxiety 02/22/2007  . Allergy 02/22/2007    Past Surgical History:  Procedure Laterality Date  . COLONOSCOPY WITH PROPOFOL N/A  03/19/2020   Procedure: COLONOSCOPY WITH PROPOFOL;  Surgeon: Midge Minium, MD;  Location: [facility] ENDOSCOPY;  Service: Endoscopy;  Laterality: N/A;  . KIDNEY STONE SURGERY  2002   X 2 cystoscopy with stent    Prior to Admission medications   Medication Sig Start Date End Date Taking? Authorizing Provider  Cholecalciferol 1000 units TBDP Take 1 tablet by mouth daily. 07/01/16   Joaquim Nam, MD  FLUoxetine (PROZAC) 20 MG capsule Take 1 capsule (20 mg total) by mouth daily. 11/04/20   Joaquim Nam, MD  fluticasone Aleda Grana) 50 MCG/ACT nasal spray Place 2 sprays into both nostrils daily. 10/11/20   Janne Napoleon, NP  hydrochlorothiazide (HYDRODIURIL) 25 MG tablet TAKE 1 TABLET(25 MG) BY MOUTH DAILY 11/04/20   Joaquim Nam, MD  HYDROCORTISONE ACE, RECTAL, 30 MG SUPP Use twice daily if needed. 11/04/20   Joaquim Nam, MD  meloxicam (MOBIC) 15 MG tablet Take 0.5-1 tablets (7.5-15 mg total) by mouth daily as needed for pain. With food.  For pain. 12/13/20   Joaquim Nam, MD  Multiple Vitamin (MULTIVITAMIN) tablet Take 1 tablet by mouth daily.    [provider]  ondansetron (ZOFRAN ODT) 4 MG disintegrating tablet Take 1 tablet (4 mg total) by mouth every 8 (eight) hours as needed for nausea or vomiting. 11/04/20   Joaquim Nam, MD    Allergies  Allergen Reactions  . Ace Inhibitors   . Angiotensin Receptor Blockers   . Lisinopril Other (See Comments)    Presumed cause of lip swelling  . Amlodipine Other (  See Comments)    intolerant  . Codeine Sulfate     REACTION: headache  . Crestor [Rosuvastatin Calcium]     Aches with 10mg  daily  . Ketorolac Tromethamine     REACTION: severe headache  . Omeprazole Other (See Comments)    Muscle aches per patient   . Pravastatin     aches  . Tessalon [Benzonatate] Other (See Comments)    headache    Family History  Problem Relation Age of Onset  . Allergies Mother   . Cancer Father        lung-smoker  . Heart disease  Sister   . Hypertension Sister   . Colon cancer Neg Hx   . Prostate cancer Neg Hx     Social History Social History   Tobacco Use  . Smoking status: Former Smoker    Types: Cigarettes  . Smokeless tobacco: Never Used  . Tobacco comment: quit 1997  Substance Use Topics  . Alcohol use: Yes    Comment: Occasional  . Drug use: No    Review of Systems Constitutional: Negative for fever. Cardiovascular: Negative for chest pain. Respiratory: Negative for shortness of breath. Gastrointestinal: Moderate dull lower abdominal pain.  Positive for nausea negative for vomiting or diarrhea. Genitourinary: Negative for urinary compaints Musculoskeletal: Negative for musculoskeletal complaints Neurological: Negative for headache All other ROS negative  ____________________________________________   PHYSICAL EXAM:  VITAL SIGNS: ED Triage Vitals  Enc Vitals Group     BP 01/15/21 0712 133/86     Pulse Rate 01/15/21 0712 (!) 53     Resp 01/15/21 0712 18     Temp 01/15/21 0712 97.8 F (36.6 C)     Temp Source 01/15/21 0712 Oral     SpO2 01/15/21 0712 99 %     Weight 01/15/21 0710 230 lb (104.3 kg)     Height 01/15/21 0710 5\' 8"  (1.727 m)     Head Circumference --      Peak Flow --      Pain Score 01/15/21 0710 8     Pain Loc --      Pain Edu? --      Excl. in GC? --    Constitutional: Alert and oriented. Well appearing and in no distress. Eyes: Normal exam ENT      Head: Normocephalic and atraumatic.      Mouth/Throat: Mucous membranes are moist. Cardiovascular: Normal rate, regular rhythm.  Respiratory: Normal respiratory effort without tachypnea nor retractions. Breath sounds are clear Gastrointestinal: Soft, mild to moderate lower abdominal tenderness palpation without rebound guarding or distention. Musculoskeletal: Nontender with normal range of motion in all extremities.  Neurologic:  Normal speech and language. No gross focal neurologic deficits Skin:  Skin is warm,  dry and intact.  Psychiatric: Mood and affect are normal.   ____________________________________________   RADIOLOGY  CT consistent with uncomplicated diverticulitis of the sigmoid colon.  ____________________________________________   INITIAL IMPRESSION / ASSESSMENT AND PLAN / ED COURSE  Pertinent labs & imaging results that were available during my care of the patient were reviewed by me and considered in my medical decision making (see chart for details).   Patient presents emergency department for lower abdominal pain intermittent over the past several weeks but now more constant dull aching type pain.  Overall the patient appears well, no distress.  Does have mild to moderate tenderness on palpation.  Lab work is reassuring including a normal white blood cell count and reassuring urinalysis.  We will  obtain a CT scan to further evaluate.  Differential would still include diverticulitis, colitis, intestinal type pain, constipation, other intra-abdominal pathology.  CT consistent uncomplicated diverticulitis of the sigmoid colon.  Patient states he follows up with Dr. Daleen Squibb of GI, last had a colonoscopy 6 months ago.  This is the patient's third episode of diverticulitis.  Discussed with the patient referral to surgery to discuss possible partial colectomy after the patient has recovered.  We will place the patient on Augmentin, Toradol to be used for discomfort given his multiple medication allergies.  Patient is allergic to ketorolac.  Patient states he has taken tramadol in the past and did well with that.  We will discharge with a short course of tramadol.  NISHAWN ROTAN was evaluated in Emergency Department on 01/15/2021 for the symptoms described in the history of present illness. He was evaluated in the context of the global COVID-19 pandemic, which necessitated consideration that the patient might be at risk for infection with the SARS-CoV-2 virus that causes COVID-19. Institutional  protocols and algorithms that pertain to the evaluation of patients at risk for COVID-19 are in a state of rapid change based on information released by regulatory bodies including the CDC and federal and state organizations. These policies and algorithms were followed during the patient's care in the ED.  ____________________________________________   FINAL CLINICAL IMPRESSION(S) / ED DIAGNOSES  Abdominal pain Uncomplicated diverticulitis   Minna Antis, MD 01/15/21 [id number]    Minna Antis, MD 01/15/21 [phone]

## 2021-01-20 ENCOUNTER — Telehealth: Payer: Self-pay

## 2021-01-20 MED ORDER — AMOXICILLIN-POT CLAVULANATE 875-125 MG PO TABS: 1.0000 | ORAL_TABLET | Freq: Two times a day (BID) | ORAL | 0 refills | Status: DC

## 2021-01-20 NOTE — Telephone Encounter (Signed)
Patient called stating that he went to ER on 01/15/21 for Diverticulitis and was started on Augmentin for 10 days and was given Tramadol. He has follow up appointment to see Surgeon on Monday 01/27/21. Patient states he usually gets more than 10 days of antibiotics and worried that if he is not getting enough antibiotic that his flare up will come back over the weekend.  Patient asked for Dr Para March to review his ER notes and his suggestions.

## 2021-01-20 NOTE — Telephone Encounter (Signed)
I saw his notes.  I sent rx for 5 more days.  The main issue is about him getting better in the meantime.  If not getting better then let me know.  If he is totally better at the end of the 10 days, he wouldn't need to extend the abx.  Thanks.

## 2021-01-21 NOTE — Telephone Encounter (Signed)
Patient advised new rx for abx was sent to pharmacy. Advised if not feeling any better by end of 10 days then to let us know.

## 2021-01-27 ENCOUNTER — Other Ambulatory Visit: Payer: Self-pay

## 2021-01-27 ENCOUNTER — Ambulatory Visit: Payer: Managed Care, Other (non HMO) | Admitting: Surgery

## 2021-01-27 ENCOUNTER — Encounter: Payer: Self-pay | Admitting: Surgery

## 2021-01-27 VITALS — BP 146/87 | HR 56 | Temp 98.6°F | Ht 68.0 in | Wt 222.4 lb

## 2021-01-27 DIAGNOSIS — K5792 Diverticulitis of intestine, part unspecified, without perforation or abscess without bleeding: Secondary | ICD-10-CM

## 2021-01-27 NOTE — Patient Instructions (Addendum)
If you have any concerns or questions, please feel free to call our office.   Diverticulitis  Diverticulitis is when small pouches in your colon (large intestine) get infected or swollen. This causes pain in the belly (abdomen) and watery poop (diarrhea). These pouches are called diverticula. The pouches form in people who have acondition called diverticulosis. What are the causes? This condition may be caused by poop (stool) that gets trapped in the pouches in your colon. The poop lets germs (bacteria) grow in the pouches. This causes the infection. What increases the risk? You are more likely to get this condition if you have small pouches in your colon. The risk is higher if: You are overweight or very overweight (obese). You do not exercise enough. You drink alcohol. You smoke or use products with tobacco in them. You eat a diet that has a lot of red meat such as beef, pork, or lamb. You eat a diet that does not have enough fiber in it. You are older than 57 years of age. What are the signs or symptoms? Pain in the belly. Pain is often on the left side, but it may be in other areas. Fever and feeling cold. Feeling like you may vomit. Vomiting. Having cramps. Feeling full. Changes to how often you poop. Blood in your poop. How is this treated? Most cases are treated at home by: Taking over-the-counter pain medicines. Following a clear liquid diet. Taking antibiotic medicines. Resting. Very bad cases may need to be treated at a hospital. This may include: Not eating or drinking. Taking prescription pain medicine. Getting antibiotic medicines through an IV tube. Getting fluid and food through an IV tube. Having surgery. When you are feeling better, your doctor may tell you to have a test to check your colon (colonoscopy). Follow these instructions at home: Medicines Take over-the-counter and prescription medicines only as told by your doctor. These  include: Antibiotics. Pain medicines. Fiber pills. Probiotics. Stool softeners. If you were prescribed an antibiotic medicine, take it as told by your doctor. Do not stop taking the antibiotic even if you start to feel better. Ask your doctor if the medicine prescribed to you requires you to avoid driving or using machinery. Eating and drinking  Follow a diet as told by your doctor. When you feel better, your doctor may tell you to change your diet. You may need to eat a lot of fiber. Fiber makes it easier to poop (have a bowel movement). Foods with fiber include: Berries. Beans. Lentils. Green vegetables. Avoid eating red meat.  General instructions Do not use any products that contain nicotine or tobacco, such as cigarettes, e-cigarettes, and chewing tobacco. If you need help quitting, ask your doctor. Exercise 3 or more times a week. Try to get 30 minutes each time. Exercise enough to sweat and make your heart beat faster. Keep all follow-up visits as told by your doctor. This is important. Contact a doctor if: Your pain does not get better. You are not pooping like normal. Get help right away if: Your pain gets worse. Your symptoms do not get better. Your symptoms get worse very fast. You have a fever. You vomit more than one time. You have poop that is: Bloody. Black. Tarry. Summary This condition happens when small pouches in your colon get infected or swollen. Take medicines only as told by your doctor. Follow a diet as told by your doctor. Keep all follow-up visits as told by your doctor. This is important. This information  is not intended to replace advice given to you by your health care provider. Make sure you discuss any questions you have with your healthcare provider.  High-Fiber Eating Plan Fiber, also called dietary fiber, is a type of carbohydrate. It is found foods such as fruits, vegetables, whole grains, and beans. A high-fiber diet can have many health  benefits. Your health care provider may recommend a high-fiber diet to help: Prevent constipation. Fiber can make your bowel movements more regular. Lower your cholesterol. Relieve the following conditions: Inflammation of veins in the anus (hemorrhoids). Inflammation of specific areas of the digestive tract (uncomplicated diverticulosis). A problem of the large intestine, also called the colon, that sometimes causes pain and diarrhea (irritable bowel syndrome, or IBS). Prevent overeating as part of a weight-loss plan. Prevent heart disease, type 2 diabetes, and certain cancers. What are tips for following this plan? Reading food labels  Check the nutrition facts label on food products for the amount of dietary fiber. Choose foods that have 5 grams of fiber or more per serving. The goals for recommended daily fiber intake include: Men (age 74 or younger): 34-38 g. Men (over age 50): 28-34 g. Women (age 64 or younger): 25-28 g. Women (over age 40): 22-25 g. Your daily fiber goal is _____________ g. Shopping Choose whole fruits and vegetables instead of processed forms, such as apple juice or applesauce. Choose a wide variety of high-fiber foods such as avocados, lentils, oats, and kidney beans. Read the nutrition facts label of the foods you choose. Be aware of foods with added fiber. These foods often have high sugar and sodium amounts per serving. Cooking Use whole-grain flour for baking and cooking. Cook with brown rice instead of white rice. Meal planning Start the day with a breakfast that is high in fiber, such as a cereal that contains 5 g of fiber or more per serving. Eat breads and cereals that are made with whole-grain flour instead of refined flour or white flour. Eat brown rice, bulgur wheat, or millet instead of white rice. Use beans in place of meat in soups, salads, and pasta dishes. Be sure that half of the grains you eat each day are whole grains. General  information You can get the recommended daily intake of dietary fiber by: Eating a variety of fruits, vegetables, grains, nuts, and beans. Taking a fiber supplement if you are not able to take in enough fiber in your diet. It is better to get fiber through food than from a supplement. Gradually increase how much fiber you consume. If you increase your intake of dietary fiber too quickly, you may have bloating, cramping, or gas. Drink plenty of water to help you digest fiber. Choose high-fiber snacks, such as berries, raw vegetables, nuts, and popcorn. What foods should I eat? Fruits Berries. Pears. Apples. Oranges. Avocado. Prunes and raisins. Dried figs. Vegetables Sweet potatoes. Spinach. Kale. Artichokes. Cabbage. Broccoli. Cauliflower.Green peas. Carrots. Squash. Grains Whole-grain breads. Multigrain cereal. Oats and oatmeal. Brown rice. Barley.Bulgur wheat. Millet. Quinoa. Bran muffins. Popcorn. Rye wafer crackers. Meats and other proteins Navy beans, kidney beans, and pinto beans. Soybeans. Split peas. Lentils. Nutsand seeds. Dairy Fiber-fortified yogurt. Beverages Fiber-fortified soy milk. Fiber-fortified orange juice. Other foods Fiber bars. The items listed above may not be a complete list of recommended foods and beverages. Contact a dietitian for more information. What foods should I avoid? Fruits Fruit juice. Cooked, strained fruit. Vegetables Fried potatoes. Canned vegetables. Well-cooked vegetables. Grains White bread. Pasta made with refined  flour. White rice. Meats and other proteins Fatty cuts of meat. Fried chicken or fried fish. Dairy Milk. Yogurt. Cream cheese. Sour cream. Fats and oils Butters. Beverages Soft drinks. Other foods Cakes and pastries. The items listed above may not be a complete list of foods and beverages to avoid. Talk with your dietitian about what choices are best for you. Summary Fiber is a type of carbohydrate. It is found in foods  such as fruits, vegetables, whole grains, and beans. A high-fiber diet has many benefits. It can help to prevent constipation, lower blood cholesterol, aid weight loss, and reduce your risk of heart disease, diabetes, and certain cancers. Increase your intake of fiber gradually. Increasing fiber too quickly may cause cramping, bloating, and gas. Drink plenty of water while you increase the amount of fiber you consume. The best sources of fiber include whole fruits and vegetables, whole grains, nuts, seeds, and beans. This information is not intended to replace advice given to you by your health care provider. Make sure you discuss any questions you have with your healthcare provider. Document Revised: 12/07/2019 Document Reviewed: 12/07/2019 Elsevier Patient Education  2022 [employer].

## 2021-01-27 NOTE — Progress Notes (Signed)
01/27/2021  History of Present Illness: Frank Strickland is a 55 y.o. male with a history of acute diverticulitis on 12/16/2019 and a more recent episode on 01/15/2021, presenting for follow-up.  Return to the emergency room on 01/15/2021 with a new episode of uncomplicated diverticulitis he was given Augmentin for 10-day course.  This was extended by his PCP.  The patient reports that currently he is feeling much better with decreased pain and only some soreness in the lower abdomen.  Denies any chest pain, shortness of breath, fevers, chills.  She reports that after the first episode last year, he also quit drinking alcohol as this had contributed to issues with his pancreas and after that time he started feeling a lot better.  He more recently started drinking again and he is wondering if the alcohol is contributing to his diverticulitis.  Denies any issues with constipation but does not follow a particularly high-fiber diet.  Past Medical History: Past Medical History:  Diagnosis Date   Allergy, unspecified not elsewhere classified    Anxiety    Duodenal ulcer    2014   Hyperlipidemia    Hypertension      Past Surgical History: Past Surgical History:  Procedure Laterality Date   COLONOSCOPY WITH PROPOFOL N/A 03/19/2020   Procedure: COLONOSCOPY WITH PROPOFOL;  Surgeon: Midge Minium, MD;  Location: [facility] ENDOSCOPY;  Service: Endoscopy;  Laterality: N/A;   KIDNEY STONE SURGERY  2002   X 2 cystoscopy with stent    Home Medications: Prior to Admission medications   Medication Sig Start Date End Date Taking? Authorizing Provider  amoxicillin-clavulanate (AUGMENTIN) 875-125 MG tablet Take 1 tablet by mouth 2 (two) times daily. 01/20/21   Joaquim Nam, MD  Cholecalciferol 1000 units TBDP Take 1 tablet by mouth daily. 07/01/16   Joaquim Nam, MD  FLUoxetine (PROZAC) 20 MG capsule Take 1 capsule (20 mg total) by mouth daily. 11/04/20   Joaquim Nam, MD  fluticasone Aleda Grana) 50 MCG/ACT nasal  spray Place 2 sprays into both nostrils daily. 10/11/20   Janne Napoleon, NP  hydrochlorothiazide (HYDRODIURIL) 25 MG tablet TAKE 1 TABLET(25 MG) BY MOUTH DAILY 11/04/20   Joaquim Nam, MD  HYDROCORTISONE ACE, RECTAL, 30 MG SUPP Use twice daily if needed. 11/04/20   Joaquim Nam, MD  meloxicam (MOBIC) 15 MG tablet Take 0.5-1 tablets (7.5-15 mg total) by mouth daily as needed for pain. With food.  For pain. 12/13/20   Joaquim Nam, MD  Multiple Vitamin (MULTIVITAMIN) tablet Take 1 tablet by mouth daily.    [provider]  ondansetron (ZOFRAN ODT) 4 MG disintegrating tablet Take 1 tablet (4 mg total) by mouth every 8 (eight) hours as needed for nausea or vomiting. 11/04/20   Joaquim Nam, MD  traMADol (ULTRAM) 50 MG tablet Take 1 tablet (50 mg total) by mouth every 6 (six) hours as needed. 01/15/21   Minna Antis, MD    Allergies: Allergies  Allergen Reactions   Ace Inhibitors    Angiotensin Receptor Blockers    Lisinopril Other (See Comments)    Presumed cause of lip swelling   Amlodipine Other (See Comments)    intolerant   Codeine Sulfate     REACTION: headache   Crestor [Rosuvastatin Calcium]     Aches with 10mg  daily   Ketorolac Tromethamine     REACTION: severe headache   Omeprazole Other (See Comments)    Muscle aches per patient    Pravastatin  aches   Tessalon [Benzonatate] Other (See Comments)    headache    Review of Systems: Review of Systems  Constitutional:  Negative for chills and fever.  Respiratory:  Negative for shortness of breath.   Cardiovascular:  Negative for chest pain.  Gastrointestinal:  Positive for abdominal pain. Negative for constipation, diarrhea, nausea and vomiting.   Physical Exam BP (!) 146/87   Pulse (!) 56   Temp 98.6 F (37 C) (Oral)   Ht 5\' 8"  (1.727 m)   Wt 222 lb 6.4 oz (100.9 kg)   SpO2 97%   BMI 33.82 kg/m  CONSTITUTIONAL: No acute distress HEENT:  Normocephalic, atraumatic, extraocular motion  intact. RESPIRATORY:  Lungs are clear, and breath sounds are equal bilaterally. Normal respiratory effort without pathologic use of accessory muscles. CARDIOVASCULAR: Heart is regular without murmurs, gallops, or rubs. GI: The abdomen is soft, obese, nondistended, with some soreness to palpation in the lower abdomen.  No peritonitis.  NEUROLOGIC:  Motor and sensation is grossly normal.  Cranial nerves are grossly intact. PSYCH:  Alert and oriented to person, place and time. Affect is normal.  Labs/Imaging: Labs from 01/15/2021: Sodium 138, potassium 4.3, chloride 103, CO2 27, BUN 18, creatinine 0.71.  Total bilirubin 2.0, AST 20, ALT 12, alkaline phosphatase 53, lipase 28.  WBC 9.3, hemoglobin 14.5, hematocrit 39.7, platelet 159.  CT scan abdomen/pelvis on 01/15/2021: IMPRESSION: 1. Findings compatible with acute uncomplicated diverticulitis involving the sigmoid colon within the midline of the lower abdomen/pelvis. No evidence of perforation or definable/drainable fluid collection on this noncontrast examination. No evidence of enteric obstruction. If not recently performed, further evaluation with colonoscopy after the resolution of acute symptoms is advised to exclude the presence of an underlying lesion. 2. Nonobstructing left-sided nephrolithiasis. 3.  Aortic Atherosclerosis (ICD10-I70.0).  Assessment and Plan: This is a 55 y.o. male with a recent episode of acute diverticulitis.  - The patient has had 2 episodes of noncomplicated diverticulitis between last year and this year.  These have been noncomplicated without any abscess or significant inflammation.  And these have responded to antibiotics as outpatient treatment.  At this point, I do not think we need to push for surgical invention in the form of sigmoidectomy.  However I did discuss with the patient that if he were to have more frequent episodes or his quality of life is affected by these, then we can discuss further.  Also if he  were to have any complicated episodes that would also be an indication for surgery.  Although I am not familiar with where alcohol can contribute to diverticulitis, I did discuss with him that he can certainly continue to pancreatitis issues and I would also recommend quitting alcohol from that perspective.  I would also recommend starting a higher fiber diet and taking Metamucil or Benefiber daily. - Follow-up as needed.  Face-to-face time spent with the patient and care providers was 25 minutes, with more than 50% of the time spent counseling, educating, and coordinating care of the patient.     57, MD [facility]

## 2021-02-11 ENCOUNTER — Encounter: Payer: Self-pay | Admitting: Family Medicine

## 2021-02-12 MED ORDER — AMOXICILLIN-POT CLAVULANATE 875-125 MG PO TABS: 1.0000 | ORAL_TABLET | Freq: Two times a day (BID) | ORAL | 0 refills | Status: DC

## 2021-02-12 NOTE — Telephone Encounter (Signed)
Patient states he started having sx from his diverticulitis again Friday. Hes having pains in his right side and nausea since Friday evening and has since gotten worse. Patient states the GI doctor told him he could get more abx from him or our office if he needed more. He thinks he took his last abx last Friday the 24th. Patient is fine to come in and get checked if he needs to. Please advise.

## 2021-02-12 NOTE — Telephone Encounter (Signed)
See TE note; patient called this am and note was sent to Dr. Para March.

## 2021-02-12 NOTE — Addendum Note (Signed)
Addended by: Joaquim Nam on: 02/12/2021 03:43 PM   Modules accepted: Orders

## 2021-02-12 NOTE — Telephone Encounter (Signed)
Patient call in to check on states of medication refill please advise

## 2021-02-12 NOTE — Telephone Encounter (Signed)
I am out of the office today.  I sent the prescription for Augmentin in the meantime.  If he is not getting better with that then he needs to get rechecked.  Please advise him to start a clear liquid diet in the meantime.  Thanks.

## 2021-02-12 NOTE — Telephone Encounter (Signed)
Mr. Saran called in wanted to know about getting some more antibotics due to he is hurting again. And if he needed to come in he will but wanted to know if it something can be sent in for him due to he doesn't want to end up in the hospital.

## 2021-02-13 NOTE — Telephone Encounter (Signed)
Notified patient via mychart. Patient sent message yesterday.

## 2021-02-19 ENCOUNTER — Ambulatory Visit: Payer: Managed Care, Other (non HMO) | Admitting: Gastroenterology

## 2021-03-03 ENCOUNTER — Encounter: Payer: Self-pay | Admitting: Family Medicine

## 2021-04-04 ENCOUNTER — Other Ambulatory Visit: Payer: Self-pay

## 2021-04-04 ENCOUNTER — Encounter: Payer: Self-pay | Admitting: Emergency Medicine

## 2021-04-04 ENCOUNTER — Emergency Department
Admission: EM | Admit: 2021-04-04 | Discharge: 2021-04-04 | Disposition: A | Discharge status: Home / Self Care | Payer: Managed Care, Other (non HMO) | Attending: Emergency Medicine | Admitting: Emergency Medicine

## 2021-04-04 DIAGNOSIS — I10 Essential (primary) hypertension: Secondary | ICD-10-CM | POA: Insufficient documentation

## 2021-04-04 DIAGNOSIS — Z87891 Personal history of nicotine dependence: Secondary | ICD-10-CM | POA: Insufficient documentation

## 2021-04-04 DIAGNOSIS — K641 Second degree hemorrhoids: Secondary | ICD-10-CM | POA: Diagnosis not present

## 2021-04-04 DIAGNOSIS — Z79899 Other long term (current) drug therapy: Secondary | ICD-10-CM | POA: Diagnosis not present

## 2021-04-04 DIAGNOSIS — K649 Unspecified hemorrhoids: Secondary | ICD-10-CM | POA: Diagnosis present

## 2021-04-04 MED ORDER — TRAMADOL HCL 50 MG PO TABS
50.0000 mg | ORAL_TABLET | Freq: Once | ORAL | Status: AC
Start: 2021-04-04 — End: 2021-04-04
  Administered 2021-04-04: 50 mg via ORAL
  Filled 2021-04-04: qty 1

## 2021-04-04 MED ORDER — LIDOCAINE HCL URETHRAL/MUCOSAL 2 % EX GEL
1.0000 "application " | Freq: Once | CUTANEOUS | Status: AC
  Administered 2021-04-04: 1 via TOPICAL
  Filled 2021-04-04: qty 5

## 2021-04-04 MED ORDER — TRAMADOL HCL 50 MG PO TABS: 50.0000 mg | ORAL_TABLET | Freq: Two times a day (BID) | ORAL | 0 refills | Status: DC | PRN

## 2021-04-04 MED ORDER — HYDROCORTISONE ACETATE 25 MG RE SUPP: 25.0000 mg | Freq: Two times a day (BID) | RECTAL | 1 refills | Status: AC

## 2021-04-04 MED ORDER — LIDOCAINE-PRILOCAINE 2.5-2.5 % EX CREA: 1.0000 "application " | TOPICAL_CREAM | CUTANEOUS | 0 refills | Status: DC | PRN

## 2021-04-04 MED ORDER — DOCUSATE SODIUM 100 MG PO CAPS: 100.0000 mg | ORAL_CAPSULE | Freq: Every day | ORAL | 2 refills | Status: AC | PRN

## 2021-04-04 NOTE — Discharge Instructions (Addendum)
Read and follow discharge care instructions.  Take medication as directed.  Follow-up with schedule appointment.

## 2021-04-04 NOTE — ED Provider Notes (Signed)
[facility] Emergency Department Provider Note   ____________________________________________   Event Date/Time   First MD Initiated Contact with Patient 04/04/21 [phone]     (approximate)  I have reviewed the triage vital signs and the nursing notes.   HISTORY  Chief Complaint Hemorrhoids    HPI Frank Strickland is a 55 y.o. male patient with presents with rectal pain with a history of hemorrhoid.  Patient states using over-the-counter medication for relief.  Patient states pain only with bowel movements.  Denies any blood on tissue paper or in stool.  Patient also states he is constipated.  Rates his pain/discomfort as a 9/10.         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   Coronary artery calcification seen on CAT scan 08/01/2018   Aorta disorder (HCC) 06/12/2018   Advance care planning 12/10/2017   Wrist pain 10/06/2017   Positive ANA (antinuclear antibody) 02/05/2017   Rheumatoid factor positive 02/05/2017   Positive anti-CCP test 02/05/2017   Vitamin D deficiency 07/02/2016   Erectile dysfunction 07/02/2016   GERD (gastroesophageal reflux disease) 07/02/2016   Prostatitis 01/17/2016   Pulmonary nodule 09/18/2015   Routine general medical examination at a health care facility 08/17/2012   HLD (hyperlipidemia) 10/12/2007   ESSENTIAL HYPERTENSION, BENIGN 10/10/2007   Anxiety 02/22/2007   Allergy 02/22/2007    Past Surgical History:  Procedure Laterality Date   COLONOSCOPY WITH PROPOFOL N/A 03/19/2020   Procedure: COLONOSCOPY WITH PROPOFOL;  Surgeon: Midge Minium, MD;  Location: [facility] ENDOSCOPY;   Service: Endoscopy;  Laterality: N/A;   KIDNEY STONE SURGERY  2002   X 2 cystoscopy with stent    Prior to Admission medications   Medication Sig Start Date End Date Taking? Authorizing Provider  docusate sodium (COLACE) 100 MG capsule Take 1 capsule (100 mg total) by mouth daily as needed. 04/04/21 04/04/22 Yes Joni Reining, PA-C  hydrocortisone (ANUSOL-HC) 25 MG suppository Place 1 suppository (25 mg total) rectally every 12 (twelve) hours. 04/04/21 04/04/22 Yes Joni Reining, PA-C  lidocaine-prilocaine (EMLA) cream Apply 1 application topically as needed. 04/04/21  Yes Joni Reining, PA-C  traMADol (ULTRAM) 50 MG tablet Take 1 tablet (50 mg total) by mouth every 12 (twelve) hours as needed. 04/04/21  Yes Joni Reining, PA-C  Cholecalciferol 1000 units TBDP Take 1 tablet by mouth daily. 07/01/16   Joaquim Nam, MD  FLUoxetine (PROZAC) 20 MG capsule Take 1 capsule (20 mg total) by mouth daily. 11/04/20   Joaquim Nam, MD  fluticasone Aleda Grana) 50 MCG/ACT nasal spray Place 2 sprays into both nostrils daily. 10/11/20   Janne Napoleon, NP  hydrochlorothiazide (HYDRODIURIL) 25 MG tablet TAKE 1 TABLET(25 MG) BY MOUTH DAILY 11/04/20   Joaquim Nam, MD  HYDROCORTISONE ACE, RECTAL, 30 MG SUPP Use twice daily if needed. 11/04/20   Joaquim Nam, MD  meloxicam (MOBIC) 15 MG tablet Take 0.5-1 tablets (7.5-15 mg total) by mouth daily as needed for pain. With food.  For pain. 12/13/20   Joaquim Nam, MD  Multiple Vitamin (MULTIVITAMIN) tablet  Take 1 tablet by mouth daily.    [provider]  ondansetron (ZOFRAN ODT) 4 MG disintegrating tablet Take 1 tablet (4 mg total) by mouth every 8 (eight) hours as needed for nausea or vomiting. 11/04/20   Joaquim Nam, MD    Allergies Ace inhibitors, Angiotensin receptor blockers, Lisinopril, Amlodipine, Codeine sulfate, Crestor [rosuvastatin calcium], Ketorolac tromethamine, Omeprazole, Pravastatin, and Tessalon [benzonatate]  Family  History  Problem Relation Age of Onset   Allergies Mother    Cancer Father        lung-smoker   Heart disease Sister    Hypertension Sister    Colon cancer Neg Hx    Prostate cancer Neg Hx     Social History Social History   Tobacco Use   Smoking status: Former    Types: Cigarettes   Smokeless tobacco: Never   Tobacco comments:    quit 1997  Vaping Use   Vaping Use: Never used  Substance Use Topics   Alcohol use: Yes    Comment: Occasional   Drug use: No    Review of Systems Constitutional: No fever/chills Eyes: No visual changes. ENT: No sore throat. Cardiovascular: Denies chest pain. Respiratory: Denies shortness of breath. Gastrointestinal: No abdominal pain.  No nausea, no vomiting.  No diarrhea.   constipation.  Rectal pain hemorrhoids. Genitourinary: Negative for dysuria. Musculoskeletal: Negative for back pain. Skin: Negative for rash. Neurological: Negative for headaches, focal weakness or numbness. Psychiatric: Anxiety, Endocrine: Hyperlipidemia, hypertension Hematological/Lymphatic:  Allergic/Immunilogical: See extensive medication allergy list  ____________________________________________   PHYSICAL EXAM:  VITAL SIGNS: ED Triage Vitals  Enc Vitals Group     BP 04/04/21 0639 127/90     Pulse Rate 04/04/21 0639 (!) 55     Resp 04/04/21 0639 18     Temp 04/04/21 0639 98 F (36.7 C)     Temp Source 04/04/21 0639 Oral     SpO2 04/04/21 0639 99 %     Weight 04/04/21 0639 230 lb (104.3 kg)     Height 04/04/21 0639  (1.727 m)     Head Circumference --      Peak Flow --      Pain Score 04/04/21 0644 9     Pain Loc --      Pain Edu? --      Excl. in GC? --     Constitutional: Alert and oriented. Well appearing and in no acute distress. Cardiovascular: Normal rate, regular rhythm. Grossly normal heart sounds.  Good peripheral circulation. Respiratory: Normal respiratory effort.  No retractions. Lungs CTAB. Gastrointestinal: Soft and  nontender. No distention. No abdominal bruits. No CVA tenderness.  Nonthrombosed hemorrhoid visible at the 6 o'clock position. Genitourinary: Deferred Musculoskeletal: No lower extremity tenderness nor edema.  No joint effusions. Neurologic:  Normal speech and language. No gross focal neurologic deficits are appreciated. No gait instability. Skin:  Skin is warm, dry and intact. No rash noted. Psychiatric: Mood and affect are normal. Speech and behavior are normal.  ____________________________________________   LABS (all labs ordered are listed, but only abnormal results are displayed)  Labs Reviewed - No data to display ____________________________________________  EKG   ____________________________________________  RADIOLOGY I, Joni Reining, personally viewed and evaluated these images (plain radiographs) as part of my medical decision making, as well as reviewing the written report by the radiologist.  ED MD interpretation:    Official radiology report(s): No results found.  ____________________________________________   PROCEDURES  Procedure(s) performed (including Critical Care):  Procedures   ____________________________________________   INITIAL IMPRESSION / ASSESSMENT AND PLAN / ED COURSE  As part of my medical decision making, I reviewed the following data within the electronic MEDICAL RECORD NUMBER         Patient presents with several days of rectal pain secondary to hemorrhoids.  No rectal bleeding.  Patient given discharge care instruction prescription for Anusol and topical lidocaine.  Patient also given prescription for Colace.      ____________________________________________   FINAL CLINICAL IMPRESSION(S) / ED DIAGNOSES  Final diagnoses:  Grade II hemorrhoids     ED Discharge Orders          Ordered    lidocaine-prilocaine (EMLA) cream  As needed        04/04/21 0748    hydrocortisone (ANUSOL-HC) 25 MG suppository  Every 12 hours         04/04/21 0748    docusate sodium (COLACE) 100 MG capsule  Daily PRN        04/04/21 0748    traMADol (ULTRAM) 50 MG tablet  Every 12 hours PRN        04/04/21 0750             Note:  This document was prepared using Dragon voice recognition software and may include unintentional dictation errors.    Joni Reining, PA-C 04/04/21 [id number]    Chesley Noon, MD 04/08/21 [id number]

## 2021-04-04 NOTE — ED Triage Notes (Addendum)
Patient ambulatory to triage with steady gait, without difficulty or distress noted; pt reports hemorrhoidal pain unrelieved by OTC meds; denies any bowel c/o, only rectal pain with defecation; st hx of same with hemorrhoidectomy several yrs ago

## 2021-04-04 NOTE — ED Notes (Signed)
See triage note  Presents with rectal pain  Possible hemorrhoid pain  Provider in with pt on arrival

## 2021-06-13 ENCOUNTER — Ambulatory Visit: Payer: Managed Care, Other (non HMO) | Admitting: Family Medicine

## 2021-06-13 ENCOUNTER — Encounter: Payer: Self-pay | Admitting: Family Medicine

## 2021-06-13 ENCOUNTER — Other Ambulatory Visit: Payer: Self-pay

## 2021-07-14 NOTE — Telephone Encounter (Signed)
I did confirm that patient was given his [employer] back when he came in to the office.    Patient did not incur any other charges.

## 2021-07-16 ENCOUNTER — Other Ambulatory Visit: Payer: Self-pay

## 2021-07-16 ENCOUNTER — Telehealth: Payer: Self-pay | Admitting: *Deleted

## 2021-07-16 ENCOUNTER — Emergency Department
Admission: EM | Admit: 2021-07-16 | Discharge: 2021-07-16 | Disposition: A | Discharge status: Home / Self Care | Payer: Managed Care, Other (non HMO) | Attending: Emergency Medicine | Admitting: Emergency Medicine

## 2021-07-16 ENCOUNTER — Emergency Department: Payer: Managed Care, Other (non HMO)

## 2021-07-16 DIAGNOSIS — H538 Other visual disturbances: Secondary | ICD-10-CM | POA: Diagnosis not present

## 2021-07-16 DIAGNOSIS — Z87891 Personal history of nicotine dependence: Secondary | ICD-10-CM | POA: Diagnosis not present

## 2021-07-16 DIAGNOSIS — Z79899 Other long term (current) drug therapy: Secondary | ICD-10-CM | POA: Diagnosis not present

## 2021-07-16 DIAGNOSIS — R42 Dizziness and giddiness: Secondary | ICD-10-CM | POA: Diagnosis present

## 2021-07-16 DIAGNOSIS — I10 Essential (primary) hypertension: Secondary | ICD-10-CM | POA: Insufficient documentation

## 2021-07-16 LAB — BASIC METABOLIC PANEL
Anion gap: 5 (ref 5–15)
BUN: 20 mg/dL (ref 6–20)
CO2: 24 mmol/L (ref 22–32)
Calcium: 8.5 mg/dL — ABNORMAL LOW (ref 8.9–10.3)
Chloride: 105 mmol/L (ref 98–111)
Creatinine, Ser: 0.72 mg/dL (ref 0.61–1.24)
GFR, Estimated: >60 mL/min (ref >60)
Glucose, Bld: 121 mg/dL — ABNORMAL HIGH (ref 70–99)
Potassium: 3.3 mmol/L — ABNORMAL LOW (ref 3.5–5.1)
Sodium: 134 mmol/L — ABNORMAL LOW (ref 135–145)

## 2021-07-16 LAB — CBC
HCT: 42.7 % (ref 39.0–52.0)
Hemoglobin: 15.1 g/dL (ref 13.0–17.0)
MCH: 33.4 pg (ref 26.0–34.0)
MCHC: 35.4 g/dL (ref 30.0–36.0)
MCV: 94.5 fL (ref 80.0–100.0)
Platelets: 164 10*3/uL (ref 150–400)
RBC: 4.52 MIL/uL (ref 4.22–5.81)
RDW: 11.2 % — ABNORMAL LOW (ref 11.5–15.5)
WBC: 7.6 10*3/uL (ref 4.0–10.5)
nRBC: 0 % (ref 0.0–0.2)

## 2021-07-16 LAB — TROPONIN I (HIGH SENSITIVITY): Troponin I (High Sensitivity): <2 ng/L (ref <18)

## 2021-07-16 MED ORDER — POTASSIUM CHLORIDE CRYS ER 20 MEQ PO TBCR
40.0000 meq | EXTENDED_RELEASE_TABLET | Freq: Once | ORAL | Status: AC
  Administered 2021-07-16: 40 meq via ORAL
  Filled 2021-07-16: qty 2

## 2021-07-16 NOTE — Telephone Encounter (Signed)
Patient is currently at [facility]. °

## 2021-07-16 NOTE — ED Triage Notes (Addendum)
Pt to ED for seeing flashing lights in right eye this am when woke up at 0630 am, states know seeing floaters and states feels like looking through milk Reports through out day has started having dizziness and centralized chest tightness. Denies shob.  Last normal when went to bed last night

## 2021-07-16 NOTE — ED Provider Notes (Signed)
[facility] Emergency Department Provider Note   ____________________________________________   Event Date/Time   First MD Initiated Contact with Patient 07/16/21 1510     (approximate)  I have reviewed the triage vital signs and the nursing notes.   HISTORY  Chief Complaint Blurred Vision and Dizziness    HPI Frank Strickland is a 55 y.o. male history of hypertension hyperlipidemia  Patient woke up about 630 this morning no strange lights.  Felt like lights that he was seeing out of his right eye that were like car lights on the wall then he realized after closing his eyes he is still seeing them.  This correlates then sort of progressed to a red spots over an hour and then through the remainder of the day today he is felt like he is seeing small "gnats" with the right I.  He is able to discern things with the right eye clearly he is farsighted and reports he does not have any changes ability to read things but just seems like there is a lot of floaters in his eye which is new for him.  Denies loss of vision.  Had a very mild headache feeling that is gone away  Also throughout the day he felt the sense of a lightheadedness somewhat hard to describe.  No fevers or chills no trauma no recent illnesses.  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   Coronary artery calcification seen on CAT scan 08/01/2018   Aorta disorder (HCC) 06/12/2018   Advance care planning 12/10/2017   Wrist pain 10/06/2017   Positive ANA (antinuclear  antibody) 02/05/2017   Rheumatoid factor positive 02/05/2017   Positive anti-CCP test 02/05/2017   Vitamin D deficiency 07/02/2016   Erectile dysfunction 07/02/2016   GERD (gastroesophageal reflux disease) 07/02/2016   Prostatitis 01/17/2016   Pulmonary nodule 09/18/2015   Routine general medical examination at a health care facility 08/17/2012   HLD (hyperlipidemia) 10/12/2007   ESSENTIAL HYPERTENSION, BENIGN 10/10/2007   Anxiety 02/22/2007   Allergy 02/22/2007    Past Surgical History:  Procedure Laterality Date   COLONOSCOPY WITH PROPOFOL N/A 03/19/2020   Procedure: COLONOSCOPY WITH PROPOFOL;  Surgeon: Midge Minium, MD;  Location: [facility] ENDOSCOPY;  Service: Endoscopy;  Laterality: N/A;   KIDNEY STONE SURGERY  2002   X 2 cystoscopy with stent    Prior to Admission medications   Medication Sig Start Date End Date Taking? Authorizing Provider  Cholecalciferol 1000 units TBDP Take 1 tablet by mouth daily. 07/01/16   Joaquim Nam, MD  docusate sodium (COLACE) 100 MG capsule Take 1 capsule (100 mg total) by mouth daily as needed. 04/04/21 04/04/22  Joni Reining, PA-C  FLUoxetine (PROZAC) 20 MG capsule Take 1 capsule (20 mg total) by mouth daily. 11/04/20   Joaquim Nam, MD  fluticasone Aleda Grana) 50 MCG/ACT nasal spray Place 2 sprays into both nostrils daily. 10/11/20   Janne Napoleon, NP  hydrochlorothiazide (HYDRODIURIL) 25 MG tablet TAKE 1 TABLET(25 MG) BY MOUTH DAILY  11/04/20   Joaquim Nam, MD  hydrocortisone (ANUSOL-HC) 25 MG suppository Place 1 suppository (25 mg total) rectally every 12 (twelve) hours. 04/04/21 04/04/22  Joni Reining, PA-C  HYDROCORTISONE ACE, RECTAL, 30 MG SUPP Use twice daily if needed. 11/04/20   Joaquim Nam, MD  lidocaine-prilocaine (EMLA) cream Apply 1 application topically as needed. 04/04/21   Joni Reining, PA-C  meloxicam (MOBIC) 15 MG tablet Take 0.5-1 tablets (7.5-15 mg total) by mouth daily as needed for pain. With food.  For pain. 12/13/20    Joaquim Nam, MD  Multiple Vitamin (MULTIVITAMIN) tablet Take 1 tablet by mouth daily.    [provider]  ondansetron (ZOFRAN ODT) 4 MG disintegrating tablet Take 1 tablet (4 mg total) by mouth every 8 (eight) hours as needed for nausea or vomiting. 11/04/20   Joaquim Nam, MD  traMADol (ULTRAM) 50 MG tablet Take 1 tablet (50 mg total) by mouth every 12 (twelve) hours as needed. 04/04/21   Joni Reining, PA-C    Allergies Ace inhibitors, Angiotensin receptor blockers, Lisinopril, Amlodipine, Codeine sulfate, Crestor [rosuvastatin calcium], Ketorolac tromethamine, Omeprazole, Pravastatin, and Tessalon [benzonatate]  Family History  Problem Relation Age of Onset   Allergies Mother    Cancer Father        lung-smoker   Heart disease Sister    Hypertension Sister    Colon cancer Neg Hx    Prostate cancer Neg Hx     Social History Social History   Tobacco Use   Smoking status: Former    Types: Cigarettes   Smokeless tobacco: Never   Tobacco comments:    quit 1997  Vaping Use   Vaping Use: Never used  Substance Use Topics   Alcohol use: Yes    Comment: Occasional   Drug use: No    Review of Systems Constitutional: No fever/chills Eyes: No visual changes. Cardiovascular: See HPI respiratory: Denies shortness of breath. Gastrointestinal: No abdominal pain.   Musculoskeletal: Negative for back pain. Skin: Negative for rash. Neurological: Negative for areas of focal weakness or numbness.    ____________________________________________   PHYSICAL EXAM:  VITAL SIGNS: ED Triage Vitals  Enc Vitals Group     BP 07/16/21 1327 (!) 145/90     Pulse Rate 07/16/21 1327 (!) 57     Resp 07/16/21 1327 20     Temp 07/16/21 1327 98.2 F (36.8 C)     Temp Source 07/16/21 1327 Oral     SpO2 07/16/21 1327 98 %     Weight 07/16/21 1328 235 lb (106.6 kg)     Height 07/16/21 1328 5\' 8"  (1.727 m)     Head Circumference --      Peak Flow --      Pain Score  07/16/21 1327 6     Pain Loc --      Pain Edu? --      Excl. in GC? --     Constitutional: Alert and oriented. Well appearing and in no acute distress. Eyes: Conjunctivae are normal. Head: Atraumatic.  Normal extraocular movements.  Conjunctiva normal bilaterally.  Pupils equal round reactive to light bilaterally.  Ophthalmologic exam demonstrates clear crisp appearance of the vessels and posterior retina bilaterally.  No blood in thunder.    Visual Acuity  Right Eye Distance:   Left Eye Distance:   Bilateral Distance:    Right Eye Near:   Left Eye Near:    Bilateral Near:     Nurse, 07/18/21,  reports patient essentially 20/20 in the right eye, 20/25 in the left in isolation  Nose: No congestion/rhinnorhea. Mouth/Throat: Mucous membranes are moist. Neck: No stridor.  Cardiovascular: Normal rate, regular rhythm. Grossly normal heart sounds.  Good peripheral circulation. Respiratory: Normal respiratory effort.  No retractions. Lungs CTAB. Musculoskeletal: No lower extremity tenderness nor edema. Neurologic:  Normal speech and language. No gross focal neurologic deficits are appreciated.  Skin:  Skin is warm, dry and intact. No rash noted. Psychiatric: Mood and affect are normal. Speech and behavior are normal.  ____________________________________________   LABS (all labs ordered are listed, but only abnormal results are displayed)  Labs Reviewed  BASIC METABOLIC PANEL - Abnormal; Notable for the following components:      Result Value   Sodium 134 (*)    Potassium 3.3 (*)    Glucose, Bld 121 (*)    Calcium 8.5 (*)    All other components within normal limits  CBC - Abnormal; Notable for the following components:   RDW 11.2 (*)    All other components within normal limits  TROPONIN I (HIGH SENSITIVITY)  TROPONIN I (HIGH SENSITIVITY)   ____________________________________________  EKG  ED ECG REPORT I, Sharyn Creamer, the attending physician, personally viewed and  interpreted this ECG.  Date: 07/16/2021 EKG Time: 1335 Rate: 60 Rhythm: normal sinus rhythm QRS Axis: normal Intervals: normal ST/T Wave abnormalities: normal Narrative Interpretation: no evidence of acute ischemia  ____________________________________________  RADIOLOGY  DG Chest 2 View  Result Date: 07/16/2021 CLINICAL DATA:  Chest pain EXAM: CHEST - 2 VIEW COMPARISON:  07/22/2020 FINDINGS: The heart size and mediastinal contours are within normal limits. Both lungs are clear. The visualized skeletal structures are unremarkable. IMPRESSION: No active cardiopulmonary disease. Electronically Signed   By: Marlan Palau M.D.   On: 07/16/2021 14:14   MR BRAIN WO CONTRAST  Result Date: 07/16/2021 CLINICAL DATA:  Vision loss.  Rule out stroke EXAM: MRI HEAD WITHOUT CONTRAST TECHNIQUE: Multiplanar, multiecho pulse sequences of the brain and surrounding structures were obtained without intravenous contrast. COMPARISON:  None. FINDINGS: Brain: No acute infarction, hemorrhage, hydrocephalus, extra-axial collection or mass lesion. Normal white matter and brainstem Vascular: Normal arterial flow voids at the skull base Skull and upper cervical spine: No focal skeletal abnormality Sinuses/Orbits: Paranasal sinuses clear.  Normal orbit Other: None IMPRESSION: Normal MRI head without contrast. Electronically Signed   By: Marlan Palau M.D.   On: 07/16/2021 18:34     Chest x-ray normal.  MRI brain normal personally reviewed by me ____________________________________________   PROCEDURES  Procedure(s) performed: None  Procedures  Critical Care performed: No  ____________________________________________   INITIAL IMPRESSION / ASSESSMENT AND PLAN / ED COURSE  Pertinent labs & imaging results that were available during my care of the patient were reviewed by me and considered in my medical decision making (see chart for details).   Vision change right eye.  Reassuring clinical exam.  No  loss of vision and visual fields are normal.  Visual acuity reassuring as well.  MRI brain negative for stroke which was on the differential and has been excluded.  Discussed with ophthalmology, patient to follow-up tomorrow morning.  Patient had chest discomfort or feeling of fleeting abnormal sensation reports he thinks that might of come about after he was concerned he might had a stroke with his symptoms.  His EKG troponin chest x-ray very reassuring and he has no longer has any chest symptoms.  Doubt ACS clinically.  Clinical Course as of 07/16/21  1941  Wed Jul 16, 2021  1600 Discussed with Dr. Brooke Dare. Advises outpatient optho follow-up. Can see patient at [facility] tomorrow, [phone]. [MQ]    Clinical Course User Index [MQ] Sharyn Creamer, MD   ----------------------------------------- 7:41 PM on 07/16/2021 ----------------------------------------- We will replete for an incidentally noted slightly low potassium.  Do not find this to be clinically significant at this point.  Return precautions and treatment recommendations and follow-up discussed with the patient who is agreeable with the plan.  Patient's wife will be driving him home and to his appointment at 830 tomorrow morning at [facility].    ____________________________________________   FINAL CLINICAL IMPRESSION(S) / ED DIAGNOSES  Final diagnoses:  Blurred vision, right eye        Note:  This document was prepared using Dragon voice recognition software and may include unintentional dictation errors       Sharyn Creamer, MD 07/16/21 1941

## 2021-07-16 NOTE — Telephone Encounter (Signed)
I am not in clinic today.  I will await the ER notes. Thanks.

## 2021-07-16 NOTE — ED Notes (Signed)
Visual Acuity Left eye- 20/40 Right eye- 20/25 Both eyes- 20/20

## 2021-07-16 NOTE — Telephone Encounter (Signed)
PLEASE NOTE: All timestamps contained within this report are represented as [place] Standard Time. CONFIDENTIALTY NOTICE: This fax transmission is intended only for the addressee. It contains information that is legally privileged, confidential or otherwise protected from use or disclosure. If you are not the intended recipient, you are strictly prohibited from reviewing, disclosing, copying using or disseminating any of this information or taking any action in reliance on or regarding this information. If you have received this fax in error, please notify us immediately by telephone so that we can arrange for its return to [place]. Phone: [phone], Toll-Free: [phone], Fax: [phone] Page: 1 of 2 Call Id: [id number] [facility] Day - Client TELEPHONE ADVICE RECORD Access[employer] Patient Name: Frank Strickland Gender: Male DOB: 02-28-1966 Age: 55 Y 11 M 12 D Return Phone Number: [phone] (Primary), [phone] (Secondary) Address: City/ State/ Zip: [place]  [address] [facility] Day - Client Client Site  [facility] Day Provider Raechel Ache - MD Contact Type Call Who Is Calling Patient / Member / Family / Caregiver Call Type Triage / Clinical Relationship To Patient Self Return Phone Number [phone] (Primary) Chief Complaint Double Vision Reason for Call Symptomatic / Request for Health Information Initial Comment Caller trans from office no appt today Caller states he's having blurred vision since this morning/ its in his right eye and having flashes along with fuzzy vision Caller states his blood pressure was 160/95 after a while it dropped to 130/90 Translation No Nurse Assessment Nurse: Nunzio Cory, RN, Sherrie Date/Time (Eastern Time): 07/16/2021 11:56:50 AM Confirm and document reason for call. If symptomatic, describe symptoms. ---Caller states having blurred vision in right eye,  on top corner of right eye. When woke up this am kept seeing streaks of light flashes x 5 min in upper and lower vision fields and finally subsided. then started seeing spots in the same areas. Now up in top corner of right eye having foggy vision. When looks straight ahead right upper eyelid feels heavy and like film over eye. Having some soreness in the upper right corner of right eye. Blood pressure was 160/95 this morning, after a while it dropped to 130/90. Does the patient have any new or worsening symptoms? ---Yes Will a triage be completed? ---Yes Related visit to physician within the last 2 weeks? ---No Does the PT have any chronic conditions? (i.e. diabetes, asthma, this includes High risk factors for pregnancy, etc.) ---Yes List chronic conditions. ---HTN Is this a behavioral health or substance abuse call? ---No PLEASE NOTE: All timestamps contained within this report are represented as [place] Standard Time. CONFIDENTIALTY NOTICE: This fax transmission is intended only for the addressee. It contains information that is legally privileged, confidential or otherwise protected from use or disclosure. If you are not the intended recipient, you are strictly prohibited from reviewing, disclosing, copying using or disseminating any of this information or taking any action in reliance on or regarding this information. If you have received this fax in error, please notify us immediately by telephone so that we can arrange for its return to [place]. Phone: [phone], Toll-Free: [phone], Fax: [phone] Page: 2 of 2 Call Id: [id number] Guidelines Guideline Title Affirmed Question Affirmed Notes Nurse Date/Time Lamount Cohen Time) Vision Loss or Change [1] Blurred vision or visual changes AND [2] present now AND [3] sudden onset or new (e.g., minutes, hours, days) (Exception: seeing floaters / black specks OR previously diagnosed migraine headaches with  same symptoms) Nunzio Cory,  RN, Sherrie 07/16/2021 12:04:49 PM Disp. Time Lamount Cohen Time) Disposition Final User 07/16/2021 12:07:20 PM Go to ED Now (or PCP triage) Yes Nunzio Cory, RN, Sherrie Caller Disagree/Comply Comply Caller Understands Yes PreDisposition InappropriateToAsk Care Advice Given Per Guideline GO TO ED NOW (OR PCP TRIAGE): * IF NO PCP (PRIMARY CARE PROVIDER) SECOND-LEVEL TRIAGE: You need to be seen within the next hour. Go to the ED/UCC at _____________ Hospital. Leave as soon as you can. ANOTHER ADULT SHOULD DRIVE: * It is better and safer if another adult drives instead of you. Referrals [facility] - ED

## 2021-07-16 NOTE — ED Notes (Signed)
Discussed pt with Dr Darnelle Catalan

## 2021-10-08 IMAGING — CT CT RENAL STONE PROTOCOL
2 of 4 series · 16 of 46 positions shown, 18 images · non-contrast
Comparison: None.

CLINICAL DATA: Pt states that he started having symptoms yesterday
in his right flank area that is radiating to his rlq and into his
right groin

EXAM:
CT ABDOMEN AND PELVIS WITHOUT CONTRAST
TECHNIQUE: Multidetector CT imaging of the abdomen and pelvis was performed
following the standard protocol without IV contrast.

[Series 2: stone full standard · axial · 0.88mm/px · z∈[-1132,-652]mm · 13 of 106 slices shown, 15 images]
[im 5/106  soft-tissue]
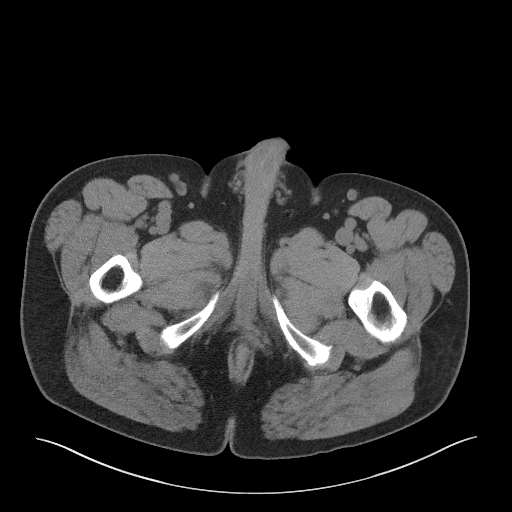
[im 5/106  bone]
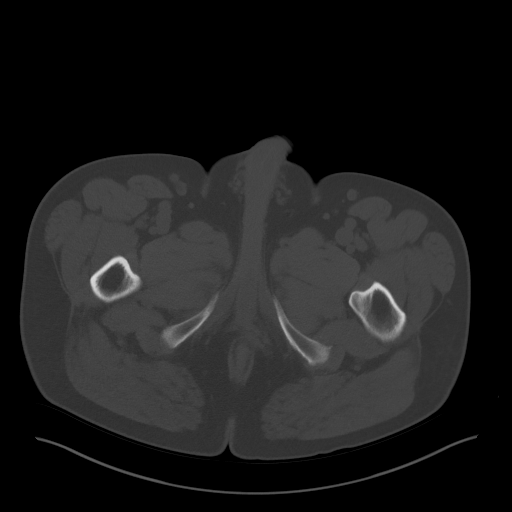
[im 14/106  soft-tissue]
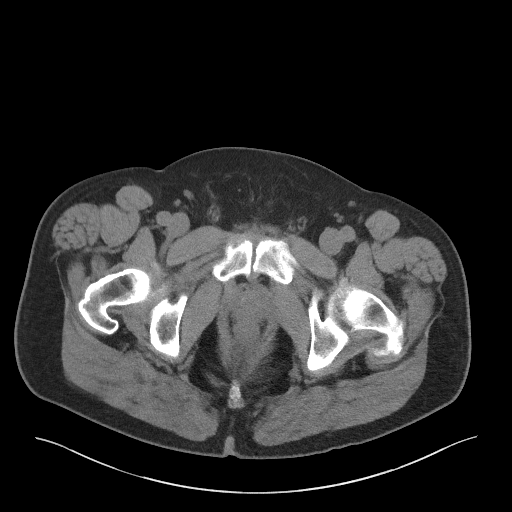
[im 23/106  soft-tissue]
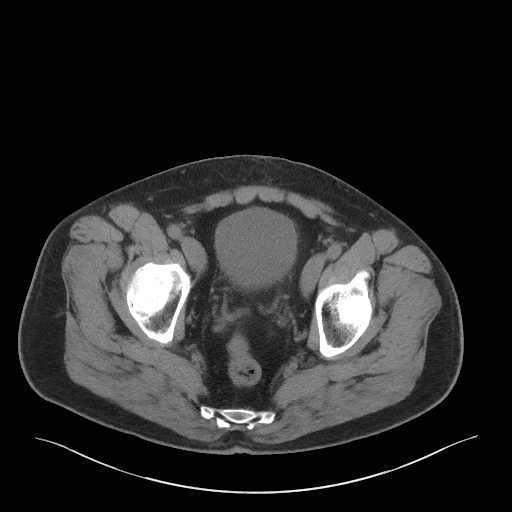
[im 28/106  soft-tissue]
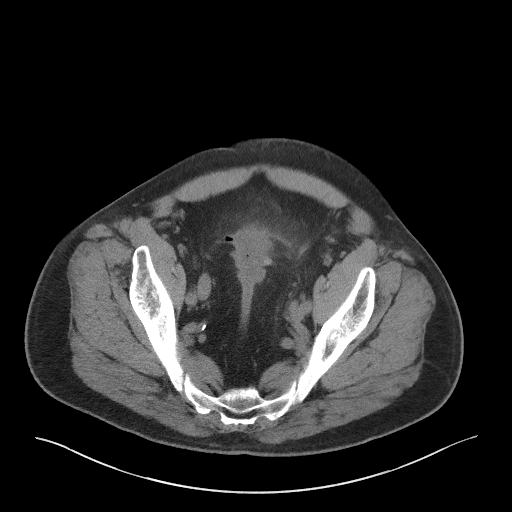
[im 37/106  soft-tissue]
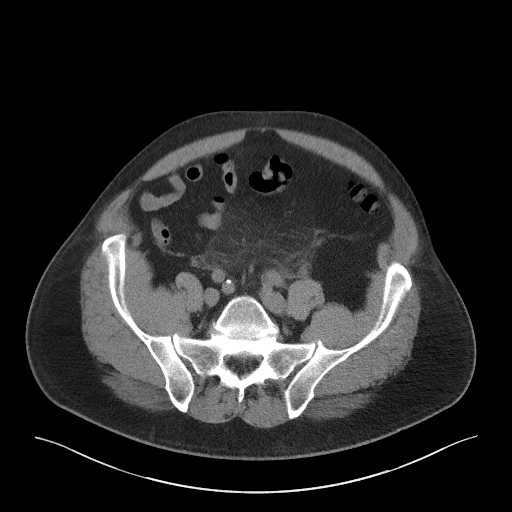
[im 46/106  soft-tissue]
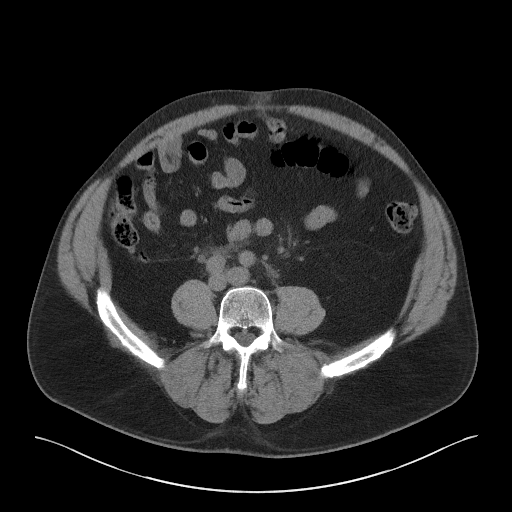
[im 55/106  soft-tissue]
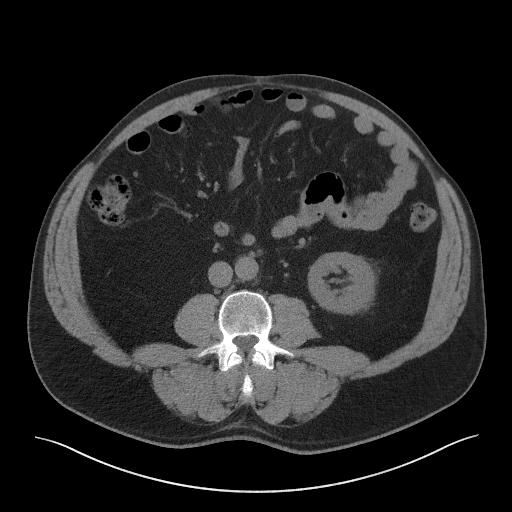
[im 60/106  soft-tissue]
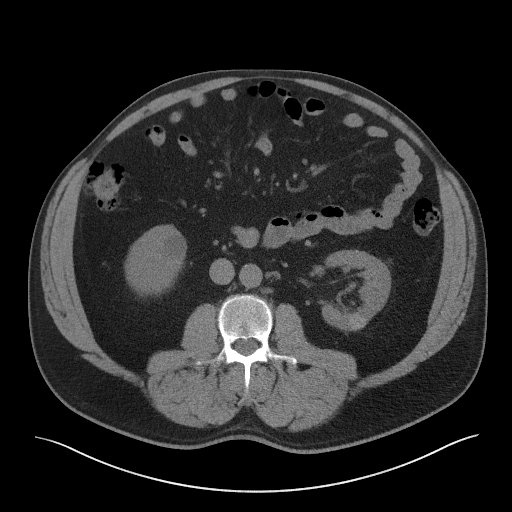
[im 69/106  soft-tissue]
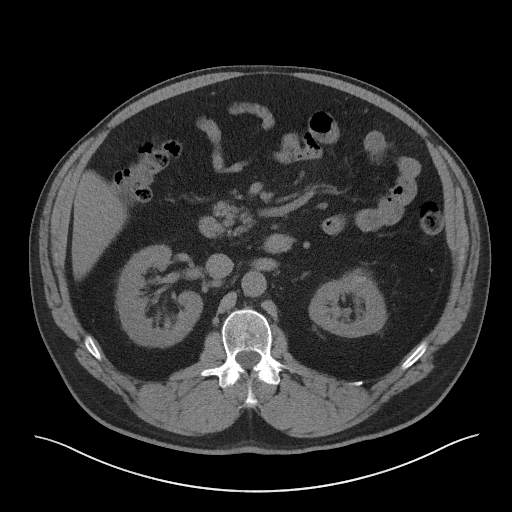
[im 69/106  bone]
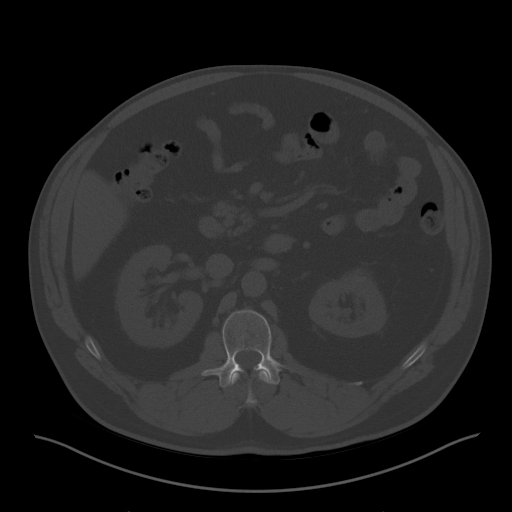
[im 78/106  soft-tissue]
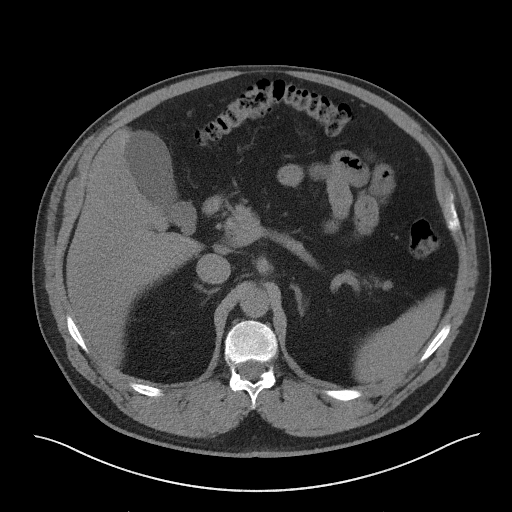
[im 83/106  soft-tissue]
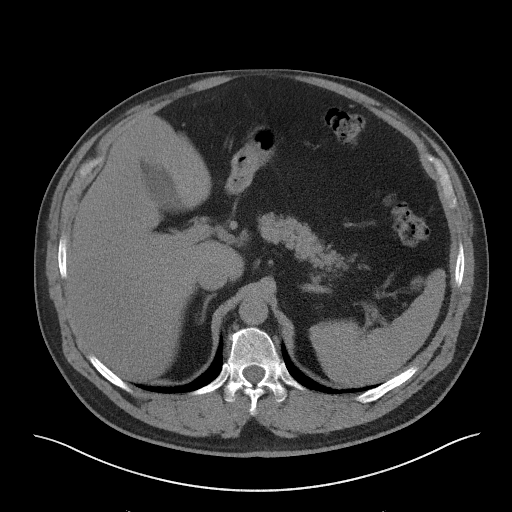
[im 92/106  soft-tissue]
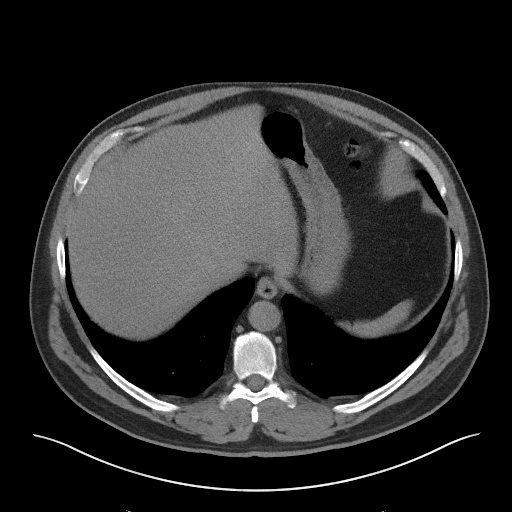
[im 101/106  soft-tissue]
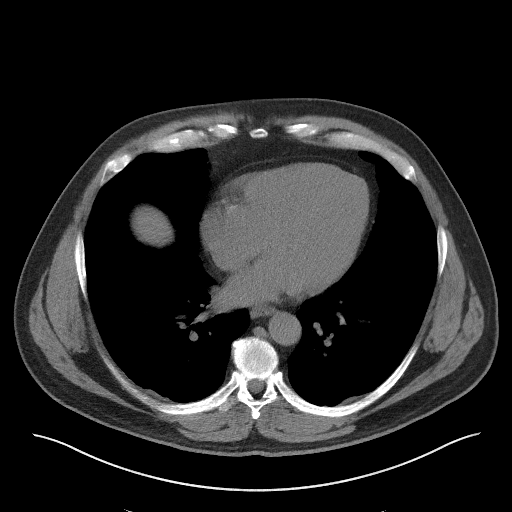

[Series 5: coronal · coronal · 0.80mm/px · 3 of 173 slices shown]
[im 58/173  soft-tissue]
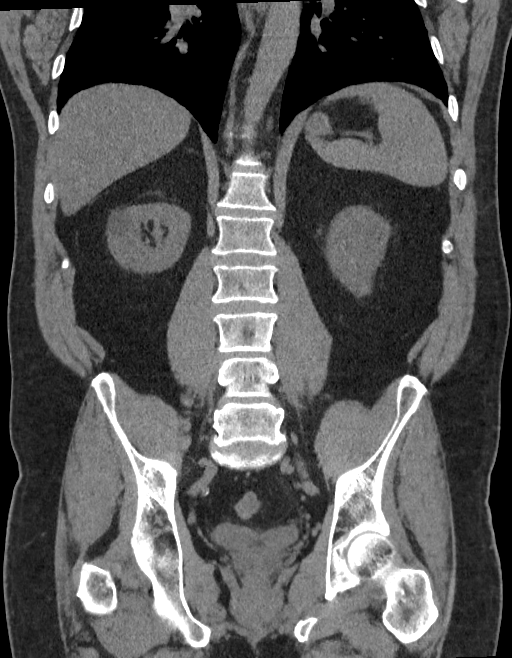
[im 77/173  soft-tissue]
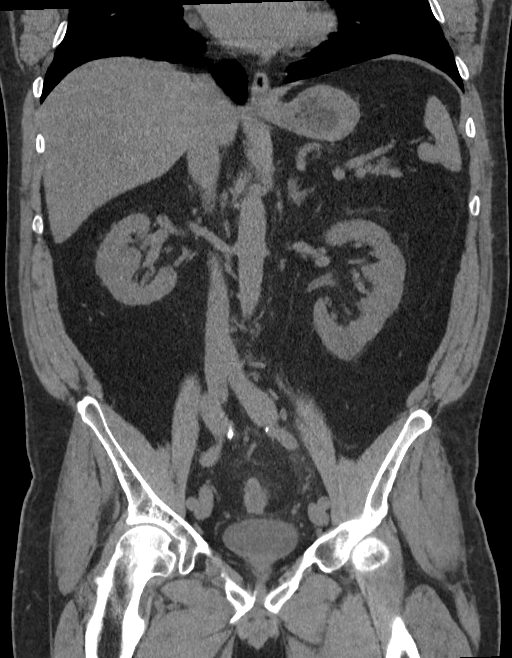
[im 96/173  soft-tissue]
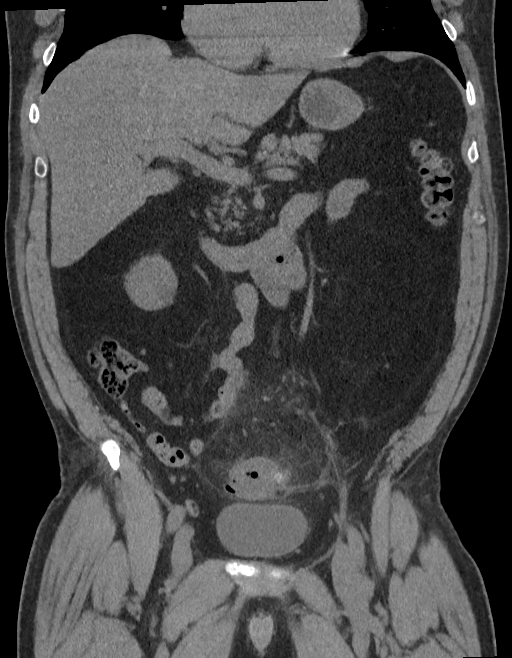

[16 of 46 positions shown; findings below may reference images not displayed]

FINDINGS: Lower chest: Stable 3 mm nodule in the right middle lobe. No acute
finding.

Somewhat limited evaluation of the abdominal viscera given lack of
IV contrast.

Hepatobiliary: No focal liver abnormality is seen. No gallstones,
gallbladder wall thickening, or biliary dilatation.

Pancreas: Unremarkable. No surrounding inflammatory changes.

Spleen: Normal in size without focal abnormality.

Adrenals/Urinary Tract: Adrenal glands are unremarkable. No
hydronephrosis or renal calculi. There are multiple bilateral renal
cysts, some of which are hyperdense consistent with proteinaceous or
hemorrhagic material. The urinary bladder is unremarkable.

Stomach/Bowel: Stomach is within normal limits. Appendix appears
normal. There is significant bowel wall thickening and mesenteric
fat stranding about the sigmoid colon associated with multiple
diverticula. No significant pericolonic fluid collection the
remainder of the bowel is normal in appearance. No distant free air.

Vascular/Lymphatic: No significant vascular findings are present. No
enlarged abdominal or pelvic lymph nodes.

Reproductive: Prostate is unremarkable.

Other: No abdominal wall hernia or abnormality. No abdominopelvic
ascites.

Musculoskeletal: Bilateral L5 pars defects with grade 1
anterolisthesis of L5 on S1, not significantly changed from prior.
IMPRESSION: Findings consistent with acute uncomplicated sigmoid diverticulitis.

## 2021-11-06 ENCOUNTER — Telehealth: Payer: Self-pay

## 2021-11-06 NOTE — Telephone Encounter (Signed)
Called and left VM for Frank Strickland this appt is required. Called patients wife and was able to set appt up for patient for 11/17/21 at 11:30 am for surgery clearance. ?

## 2021-11-06 NOTE — Telephone Encounter (Signed)
Frank Strickland called to find out the status of the pt's surgical clearance form that was faxed 10-27-21. I did not see an appt set up or the form in Media. Please advise if this has been done or if pt needs to set up an appt here for that [phone] opt 4 then opt 2. ?

## 2021-11-07 ENCOUNTER — Other Ambulatory Visit: Payer: Self-pay | Admitting: Family Medicine

## 2021-11-12 ENCOUNTER — Other Ambulatory Visit: Payer: Self-pay | Admitting: Family Medicine

## 2021-11-17 ENCOUNTER — Encounter: Payer: Self-pay | Admitting: Family Medicine

## 2021-11-17 ENCOUNTER — Ambulatory Visit: Payer: Managed Care, Other (non HMO) | Admitting: Family Medicine

## 2021-11-17 VITALS — BP 130/84 | HR 60 | Temp 97.8°F | Ht 68.0 in | Wt 241.0 lb

## 2021-11-17 DIAGNOSIS — Z01818 Encounter for other preprocedural examination: Secondary | ICD-10-CM

## 2021-11-17 DIAGNOSIS — M25569 Pain in unspecified knee: Secondary | ICD-10-CM | POA: Diagnosis not present

## 2021-11-17 LAB — CBC WITH DIFFERENTIAL/PLATELET
Basophils Absolute: 0 10*3/uL (ref 0.0–0.1)
Basophils Relative: 0.7 % (ref 0.0–3.0)
Eosinophils Absolute: 0.1 10*3/uL (ref 0.0–0.7)
Eosinophils Relative: 2.3 % (ref 0.0–5.0)
HCT: 43.8 % (ref 39.0–52.0)
Hemoglobin: 15.1 g/dL (ref 13.0–17.0)
Lymphocytes Relative: 45 % (ref 12.0–46.0)
Lymphs Abs: 2.7 10*3/uL (ref 0.7–4.0)
MCHC: 34.6 g/dL (ref 30.0–36.0)
MCV: 96.1 fl (ref 78.0–100.0)
Monocytes Absolute: 0.5 10*3/uL (ref 0.1–1.0)
Monocytes Relative: 7.6 % (ref 3.0–12.0)
Neutro Abs: 2.7 10*3/uL (ref 1.4–7.7)
Neutrophils Relative %: 44.4 % (ref 43.0–77.0)
Platelets: 171 10*3/uL (ref 150.0–400.0)
RBC: 4.56 Mil/uL (ref 4.22–5.81)
RDW: 11.9 % (ref 11.5–15.5)
WBC: 6 10*3/uL (ref 4.0–10.5)

## 2021-11-17 LAB — BASIC METABOLIC PANEL
BUN: 16 mg/dL (ref 6–23)
CO2: 30 mEq/L (ref 19–32)
Calcium: 9 mg/dL (ref 8.4–10.5)
Chloride: 101 mEq/L (ref 96–112)
Creatinine, Ser: 0.86 mg/dL (ref 0.40–1.50)
GFR: 96.27 mL/min (ref >60.00)
Glucose, Bld: 82 mg/dL (ref 70–99)
Potassium: 4.4 mEq/L (ref 3.5–5.1)
Sodium: 137 mEq/L (ref 135–145)

## 2021-11-17 LAB — URINALYSIS, ROUTINE W REFLEX MICROSCOPIC
Bilirubin Urine: NEGATIVE
Hgb urine dipstick: NEGATIVE
Ketones, ur: NEGATIVE
Leukocytes,Ua: NEGATIVE
Nitrite: NEGATIVE
RBC / HPF: NONE SEEN (ref 0–?)
Specific Gravity, Urine: 1.015 (ref 1.000–1.030)
Total Protein, Urine: NEGATIVE
Urine Glucose: NEGATIVE
Urobilinogen, UA: 0.2 (ref 0.0–1.0)
WBC, UA: NONE SEEN (ref 0–?)
pH: 7 (ref 5.0–8.0)

## 2021-11-17 LAB — PROTIME-INR
INR: 1.1 ratio — ABNORMAL HIGH (ref 0.8–1.0)
Prothrombin Time: 11.9 s (ref 9.6–13.1)

## 2021-11-17 LAB — APTT: aPTT: 30.5 s (ref 23.4–32.7)

## 2021-11-17 NOTE — Progress Notes (Signed)
Planned knee surgery, left.  On light duty at work.  He is having to sit at work, having to ice mult times per day.  Sit pain, limited walking from pain.  B knee pain, with plan to do left then the right knee.  He has been putting up with pain.  Tramadol helps some.  ? ?D/w pt about options and risk/benefit.  He wants to be able to get back to work.   ? ?No CP, on CV hx. Limited by knee pain, no cardiopulmonary issue.   ? ?Meds, vitals, and allergies reviewed.  ? ?ROS: Per HPI unless specifically indicated in ROS section  ? ?GEN: nad, alert and oriented ?HEENT: ncat ?NECK: supple w/o LA ?CV: rrr.  no murmur ?PULM: ctab, no inc wob ?ABD: soft, +bs ?EXT: no edema ?SKIN: no acute rash ?Limping from left knee pain. ? ?See notes on labs.  ?EKG d/w pt.  No acute changes.  ?

## 2021-11-17 NOTE — Patient Instructions (Signed)
Go to the lab on the way out.   If you have mychart we'll likely use that to update you.    ?I'll update ortho.  Assuming your labs and EKG are okay, you should be appropriately low risk for surgery.  ?Take care.  Glad to see you. ?

## 2021-11-18 ENCOUNTER — Other Ambulatory Visit: Payer: Self-pay | Admitting: Family Medicine

## 2021-11-18 LAB — URINE CULTURE
MICRO NUMBER:: 13214352
Result:: NO GROWTH
SPECIMEN QUALITY:: ADEQUATE

## 2021-11-18 MED ORDER — TRAMADOL HCL 50 MG PO TABS: 50.0000 mg | ORAL_TABLET | Freq: Two times a day (BID) | ORAL | 0 refills | Status: DC | PRN

## 2021-11-18 MED ORDER — NAPROXEN 500 MG PO TABS: 500.0000 mg | ORAL_TABLET | Freq: Two times a day (BID) | ORAL | 0 refills | Status: DC | PRN

## 2021-11-19 NOTE — Assessment & Plan Note (Signed)
Assuming his labs are normal he should be acceptably low risk for surgery and it would make sense to proceed.  Discussed.  He can update me as needed. ?

## 2022-03-04 ENCOUNTER — Other Ambulatory Visit: Payer: Self-pay | Admitting: Family Medicine

## 2022-03-04 DIAGNOSIS — I10 Essential (primary) hypertension: Secondary | ICD-10-CM

## 2022-03-05 ENCOUNTER — Other Ambulatory Visit (INDEPENDENT_AMBULATORY_CARE_PROVIDER_SITE_OTHER): Payer: Managed Care, Other (non HMO)

## 2022-03-05 DIAGNOSIS — I10 Essential (primary) hypertension: Secondary | ICD-10-CM

## 2022-03-05 LAB — COMPREHENSIVE METABOLIC PANEL
ALT: 14 U/L (ref 0–53)
AST: 25 U/L (ref 0–37)
Albumin: 4.1 g/dL (ref 3.5–5.2)
Alkaline Phosphatase: 62 U/L (ref 39–117)
BUN: 20 mg/dL (ref 6–23)
CO2: 29 mEq/L (ref 19–32)
Calcium: 8.8 mg/dL (ref 8.4–10.5)
Chloride: 101 mEq/L (ref 96–112)
Creatinine, Ser: 0.73 mg/dL (ref 0.40–1.50)
GFR: 100.94 mL/min (ref >60.00)
Glucose, Bld: 91 mg/dL (ref 70–99)
Potassium: 4.2 mEq/L (ref 3.5–5.1)
Sodium: 140 mEq/L (ref 135–145)
Total Bilirubin: 0.9 mg/dL (ref 0.2–1.2)
Total Protein: 6.4 g/dL (ref 6.0–8.3)

## 2022-03-05 LAB — LIPID PANEL
Cholesterol: 221 mg/dL — ABNORMAL HIGH (ref 0–200)
HDL: 41.9 mg/dL (ref >39.00)
LDL Cholesterol: 146 mg/dL — ABNORMAL HIGH (ref 0–99)
NonHDL: 178.87
Total CHOL/HDL Ratio: 5
Triglycerides: 166 mg/dL — ABNORMAL HIGH (ref 0.0–149.0)
VLDL: 33.2 mg/dL (ref 0.0–40.0)

## 2022-03-10 ENCOUNTER — Ambulatory Visit (INDEPENDENT_AMBULATORY_CARE_PROVIDER_SITE_OTHER): Payer: Managed Care, Other (non HMO) | Admitting: Family Medicine

## 2022-03-10 ENCOUNTER — Encounter: Payer: Self-pay | Admitting: Family Medicine

## 2022-03-10 VITALS — BP 118/70 | HR 67 | Temp 97.2°F | Ht 68.0 in | Wt 244.0 lb

## 2022-03-10 DIAGNOSIS — F419 Anxiety disorder, unspecified: Secondary | ICD-10-CM

## 2022-03-10 DIAGNOSIS — Z0000 Encounter for general adult medical examination without abnormal findings: Secondary | ICD-10-CM

## 2022-03-10 DIAGNOSIS — Z7189 Other specified counseling: Secondary | ICD-10-CM

## 2022-03-10 DIAGNOSIS — I10 Essential (primary) hypertension: Secondary | ICD-10-CM

## 2022-03-10 DIAGNOSIS — M25569 Pain in unspecified knee: Secondary | ICD-10-CM

## 2022-03-10 NOTE — Patient Instructions (Signed)
Update me as needed.  Good luck with ortho. Thanks for your effort.  Take care.  Glad to see you.

## 2022-03-10 NOTE — Progress Notes (Unsigned)
CPE- See plan.  Routine anticipatory guidance given to patient.  See health maintenance.  The possibility exists that previously documented standard health maintenance information may have been brought forward from a previous encounter into this note.  If needed, that same information has been updated to reflect the current situation based on today's encounter.    Tetanus shot done 2020 Flu done yearly PNA not due d/w pt.  Shingles prev done.   covid vaccine done Living will d/w pt. Would have his wife designated if patient were incapacitated.  Prostate cancer screening and PSA options (with potential risks and benefits of testing vs not testing) were discussed along with recent recs/guidelines.  He declined testing PSA at this point. Colonoscopy 2021 Labs d/w pt.   HAV and HBV vaccine prev done through work.    Hypertension:    Using medication without problems or lightheadedness: yes Chest pain with exertion:no Edema:no Short of breath:no  Mood d/w pt. Mood is good.  He wants to get back to hunting.  Compliant with SSRI.  He was frustrated with knee pain, and that would be expected.    He had L knee replacement.  Still putting up with R knee pain.  He has done PT.  He is going to see ortho next week about his R knee.  L knee clearly feels better.  Taking prn gabapentin for pain.    PMH and SH reviewed  Meds, vitals, and allergies reviewed.   ROS: Per HPI.  Unless specifically indicated otherwise in HPI, the patient denies:  General: fever. Eyes: acute vision changes ENT: sore throat Cardiovascular: chest pain Respiratory: SOB GI: vomiting GU: dysuria Musculoskeletal: acute back pain Derm: acute rash Neuro: acute motor dysfunction Psych: worsening mood Endocrine: polydipsia Heme: bleeding Allergy: hayfever  GEN: nad, alert and oriented HEENT: ncat NECK: supple w/o LA CV: rrr. PULM: ctab, no inc wob ABD: soft, +bs EXT: no edema SKIN: no acute rash Left knee with  well-healed anterior scar. Right knee wrapped with Ace bandage.

## 2022-03-11 NOTE — Assessment & Plan Note (Signed)
He did well with his previous surgery and it appears that he would be appropriately low risk for future right knee replacement.  He is going to follow-up with orthopedics.

## 2022-03-11 NOTE — Assessment & Plan Note (Signed)
  Tetanus shot done 2020 Flu done yearly PNA not due d/w pt.  Shingles prev done.   covid vaccine done Living will d/w pt. Would have his wife designated if patient were incapacitated.  Prostate cancer screening and PSA options (with potential risks and benefits of testing vs not testing) were discussed along with recent recs/guidelines.  He declined testing PSA at this point. Colonoscopy 2021 Labs d/w pt.   HAV and HBV vaccine prev done through work.

## 2022-03-11 NOTE — Assessment & Plan Note (Signed)
Mood is good.  He wants to get back to hunting.  Compliant with SSRI.  He was frustrated with knee pain, and that would be expected.  Continue fluoxetine as is.

## 2022-03-11 NOTE — Assessment & Plan Note (Signed)
Continue hydrochlorothiazide.  Blood pressure controlled.

## 2022-03-11 NOTE — Assessment & Plan Note (Signed)
Living will d/w pt.   Would have his wife designated if patient were incapacitated.  

## 2022-05-03 ENCOUNTER — Emergency Department
Admission: EM | Admit: 2022-05-03 | Discharge: 2022-05-03 | Disposition: A | Discharge status: Home / Self Care | Payer: Managed Care, Other (non HMO) | Attending: Student in an Organized Health Care Education/Training Program | Admitting: Student in an Organized Health Care Education/Training Program

## 2022-05-03 ENCOUNTER — Other Ambulatory Visit: Payer: Self-pay

## 2022-05-03 ENCOUNTER — Emergency Department: Payer: Managed Care, Other (non HMO)

## 2022-05-03 ENCOUNTER — Encounter: Payer: Self-pay | Admitting: *Deleted

## 2022-05-03 DIAGNOSIS — K5730 Diverticulosis of large intestine without perforation or abscess without bleeding: Secondary | ICD-10-CM | POA: Insufficient documentation

## 2022-05-03 DIAGNOSIS — Z96651 Presence of right artificial knee joint: Secondary | ICD-10-CM | POA: Diagnosis not present

## 2022-05-03 DIAGNOSIS — R1031 Right lower quadrant pain: Secondary | ICD-10-CM | POA: Diagnosis present

## 2022-05-03 DIAGNOSIS — K5792 Diverticulitis of intestine, part unspecified, without perforation or abscess without bleeding: Secondary | ICD-10-CM

## 2022-05-03 LAB — URINALYSIS, ROUTINE W REFLEX MICROSCOPIC
Bilirubin Urine: NEGATIVE
Glucose, UA: NEGATIVE mg/dL
Hgb urine dipstick: NEGATIVE
Ketones, ur: NEGATIVE mg/dL
Leukocytes,Ua: NEGATIVE
Nitrite: NEGATIVE
Protein, ur: NEGATIVE mg/dL
Specific Gravity, Urine: 1.014 (ref 1.005–1.030)
pH: 6 (ref 5.0–8.0)

## 2022-05-03 LAB — COMPREHENSIVE METABOLIC PANEL
ALT: 9 U/L (ref 0–44)
AST: 17 U/L (ref 15–41)
Albumin: 4.1 g/dL (ref 3.5–5.0)
Alkaline Phosphatase: 72 U/L (ref 38–126)
Anion gap: 8 (ref 5–15)
BUN: 17 mg/dL (ref 6–20)
CO2: 27 mmol/L (ref 22–32)
Calcium: 9 mg/dL (ref 8.9–10.3)
Chloride: 100 mmol/L (ref 98–111)
Creatinine, Ser: 0.85 mg/dL (ref 0.61–1.24)
GFR, Estimated: >60 mL/min (ref >60)
Glucose, Bld: 105 mg/dL — ABNORMAL HIGH (ref 70–99)
Potassium: 4 mmol/L (ref 3.5–5.1)
Sodium: 135 mmol/L (ref 135–145)
Total Bilirubin: 1.3 mg/dL — ABNORMAL HIGH (ref 0.3–1.2)
Total Protein: 7.1 g/dL (ref 6.5–8.1)

## 2022-05-03 LAB — LIPASE, BLOOD: Lipase: 76 U/L — ABNORMAL HIGH (ref 11–51)

## 2022-05-03 LAB — CBC
HCT: 39.6 % (ref 39.0–52.0)
Hemoglobin: 13.4 g/dL (ref 13.0–17.0)
MCH: 31.1 pg (ref 26.0–34.0)
MCHC: 33.8 g/dL (ref 30.0–36.0)
MCV: 91.9 fL (ref 80.0–100.0)
Platelets: 289 10*3/uL (ref 150–400)
RBC: 4.31 MIL/uL (ref 4.22–5.81)
RDW: 11.3 % — ABNORMAL LOW (ref 11.5–15.5)
WBC: 10.9 10*3/uL — ABNORMAL HIGH (ref 4.0–10.5)
nRBC: 0 % (ref 0.0–0.2)

## 2022-05-03 MED ORDER — AMOXICILLIN-POT CLAVULANATE 500-125 MG PO TABS: 1.0000 | ORAL_TABLET | Freq: Two times a day (BID) | ORAL | 0 refills | Status: DC

## 2022-05-03 MED ORDER — AMOXICILLIN-POT CLAVULANATE 875-125 MG PO TABS
1.0000 | ORAL_TABLET | Freq: Once | ORAL | Status: AC
  Administered 2022-05-03: 1 via ORAL
  Filled 2022-05-03: qty 1

## 2022-05-03 MED ORDER — HYDROMORPHONE HCL 2 MG PO TABS: 2.0000 mg | ORAL_TABLET | Freq: Two times a day (BID) | ORAL | 0 refills | Status: DC | PRN

## 2022-05-03 MED ORDER — SODIUM CHLORIDE 0.9 % IV BOLUS
1000.0000 mL | Freq: Once | INTRAVENOUS | Status: AC
  Administered 2022-05-03: 1000 mL via INTRAVENOUS

## 2022-05-03 MED ORDER — HYDROMORPHONE HCL 1 MG/ML IJ SOLN
0.5000 mg | INTRAMUSCULAR | Status: DC | PRN
Start: 2022-05-03 — End: 2022-05-03
  Administered 2022-05-03: 0.5 mg via INTRAVENOUS
  Filled 2022-05-03: qty 0.5

## 2022-05-03 MED ORDER — IOHEXOL 300 MG/ML  SOLN
100.0000 mL | Freq: Once | INTRAMUSCULAR | Status: AC | PRN
  Administered 2022-05-03: 100 mL via INTRAVENOUS

## 2022-05-03 NOTE — ED Triage Notes (Signed)
Patient c/o right lower abdominal pain that radiates to groin that has been present for 3 days. Patient had right knee replacement 16 days ago. Patient had a BM this morning that was normal. Patient hasn't taken narcotics since two days ago at night. Patient c/o "upset stomach." Patient has a history of diverticulitis and pain feels similar.

## 2022-05-03 NOTE — ED Provider Notes (Signed)
[facility] Provider Note    Event Date/Time   First MD Initiated Contact with Patient 05/03/22 1055     (approximate)   History   Abdominal Pain   HPI  Frank Strickland is a 56 y.o. male with history diverticulitis presents to the ER for evaluation of right lower quadrant pain with associated nausea burning discomfort.  He is status post recent right knee replacement no worsening swelling pain to the right knee has been stable.  States he did get some severe constipation after the surgery.  He denies any measured fevers.  Has never had pain like this before.     Physical Exam   Triage Vital Signs: ED Triage Vitals  Enc Vitals Group     BP 05/03/22 1037 (!) 145/92     Pulse Rate 05/03/22 1037 66     Resp 05/03/22 1037 18     Temp 05/03/22 1037 97.6 F (36.4 C)     Temp Source 05/03/22 1037 Oral     SpO2 05/03/22 1037 100 %     Weight 05/03/22 1043 238 lb (108 kg)     Height 05/03/22 1043 5\' 8"  (1.727 m)     Head Circumference --      Peak Flow --      Pain Score 05/03/22 1042 7     Pain Loc --      Pain Edu? --      Excl. in [place]? --     Most recent vital signs: Vitals:   05/03/22 1037  BP: (!) 145/92  Pulse: 66  Resp: 18  Temp: 97.6 F (36.4 C)  SpO2: 100%     Constitutional: Alert  Eyes: Conjunctivae are normal.  Head: Atraumatic. Nose: No congestion/rhinnorhea. Mouth/Throat: Mucous membranes are moist.   Neck: Painless ROM.  Cardiovascular:   Good peripheral circulation. Respiratory: Normal respiratory effort.  No retractions.  Gastrointestinal: Soft and with mild tenderness to palpation in the right lower quadrant no guarding or rebound. Musculoskeletal:  no deformity Neurologic:  MAE spontaneously. No gross focal neurologic deficits are appreciated.  Skin:  Skin is warm, dry and intact. No rash noted. Psychiatric: Mood and affect are normal. Speech and behavior are normal.    ED Results / Procedures / Treatments    Labs (all labs ordered are listed, but only abnormal results are displayed) Labs Reviewed  COMPREHENSIVE METABOLIC PANEL - Abnormal; Notable for the following components:      Result Value   Glucose, Bld 105 (*)    Total Bilirubin 1.3 (*)    All other components within normal limits  CBC - Abnormal; Notable for the following components:   WBC 10.9 (*)    RDW 11.3 (*)    All other components within normal limits  LIPASE, BLOOD - Abnormal; Notable for the following components:   Lipase 76 (*)    All other components within normal limits  URINALYSIS, ROUTINE W REFLEX MICROSCOPIC - Abnormal; Notable for the following components:   Color, Urine YELLOW (*)    APPearance CLEAR (*)    All other components within normal limits     EKG     RADIOLOGY Please see ED Course for my review and interpretation.  I personally reviewed all radiographic images ordered to evaluate for the above acute complaints and reviewed radiology reports and findings.  These findings were personally discussed with the patient.  Please see medical record for radiology report.    PROCEDURES:  Critical Care  performed:   Procedures   MEDICATIONS ORDERED IN ED: Medications  HYDROmorphone (DILAUDID) injection 0.5 mg (0.5 mg Intravenous Given 05/03/22 1116)  sodium chloride 0.9 % bolus 1,000 mL (1,000 mLs Intravenous New Bag/Given 05/03/22 1115)  iohexol (OMNIPAQUE) 300 MG/ML solution 100 mL (100 mLs Intravenous Contrast Given 05/03/22 1143)  amoxicillin-clavulanate (AUGMENTIN) 875-125 MG per tablet 1 tablet (1 tablet Oral Given 05/03/22 1215)     IMPRESSION / MDM / ASSESSMENT AND PLAN / ED COURSE  I reviewed the triage vital signs and the nursing notes.                              Differential diagnosis includes, but is not limited to, diverticulitis, colitis, appendicitis, stone, UTI, hernia, electrolyte abnormality   Patient presented to the ER for evaluation of symptoms as described above.  This  presenting complaint could reflect a potentially life-threatening illness therefore the patient will be placed on continuous pulse oximetry and telemetry for monitoring.  Laboratory evaluation will be sent to evaluate for the above complaints.  CT imaging will be ordered for the differential.  Will order IV narcotic pain medication will give IV fluids.   Clinical Course as of 05/03/22 1228  Sun May 03, 2022  1158 CT imaging on my review interpretation does appear consistent with acute sigmoid diverticulitis.  Will await final radiology report. [PR]  1224 Imaging is consistent with acute uncomplicated diverticulitis.  Patient given antibiotics.  Pain is controlled.  He is tolerating p.o.  Does appear stable and appropriate for outpatient follow-up. [PR]    Clinical Course User Index [PR] Willy Eddy, MD     FINAL CLINICAL IMPRESSION(S) / ED DIAGNOSES   Final diagnoses:  Right lower quadrant abdominal pain  Acute diverticulitis     Rx / DC Orders   ED Discharge Orders          Ordered    amoxicillin-clavulanate (AUGMENTIN) 500-125 MG tablet  2 times daily        05/03/22 1228    HYDROmorphone (DILAUDID) 2 MG tablet  2 times daily PRN        05/03/22 1228             Note:  This document was prepared using Dragon voice recognition software and may include unintentional dictation errors.    Willy Eddy, MD 05/03/22 1228

## 2022-05-25 ENCOUNTER — Telehealth: Payer: Self-pay | Admitting: Family Medicine

## 2022-05-25 ENCOUNTER — Encounter: Payer: Self-pay | Admitting: Family Medicine

## 2022-05-25 ENCOUNTER — Other Ambulatory Visit: Payer: Self-pay | Admitting: Family Medicine

## 2022-05-25 MED ORDER — AMOXICILLIN-POT CLAVULANATE 500-125 MG PO TABS: 1.0000 | ORAL_TABLET | Freq: Two times a day (BID) | ORAL | 0 refills | Status: DC

## 2022-05-25 NOTE — Telephone Encounter (Signed)
Please triage patient.  I sent the rx for augmentin but check with him about abd pain and current sx.  Advise likely reasonable to cut back to clear liquid diet, if he hasn't started that already.  Thanks.

## 2022-05-25 NOTE — Telephone Encounter (Signed)
Noted. Thanks.

## 2022-05-25 NOTE — Telephone Encounter (Signed)
I spoke with Frank Strickland; Frank Strickland is having sharp on and off pain at rt side at waistline. Pain started on and off 2 wks ago with mild to moderate pain that has progressed to more severe pain over the weekend. No vomiting but yesterday nauseated x 1; Frank Strickland took dissolvable zofran. No nausea today. Frank Strickland is bringing in all his meds and paperwork to appt on 05/26/22. Frank Strickland changed appt to 05/26/22 due to recently having bilateral TKR and Frank Strickland is concerned any infection will go to knee joints. No redness, swelling or pain to knees. Frank Strickland is already on clear liquid diet. Frank Strickland said after his knee surgery in June and again the 2nd knee replacement in Aug Frank Strickland said he developed a fecal impaction due to anesthesia. Frank Strickland said today he is feeling little better.Frank Strickland will drink plenty of fluids to stay hydrated. UC & ED precautions given and Frank Strickland voiced understanding. Sending note to Dr Damita Dunnings.

## 2022-05-26 ENCOUNTER — Encounter: Payer: Self-pay | Admitting: Family Medicine

## 2022-05-26 ENCOUNTER — Ambulatory Visit (INDEPENDENT_AMBULATORY_CARE_PROVIDER_SITE_OTHER): Payer: Managed Care, Other (non HMO) | Admitting: Family Medicine

## 2022-05-26 VITALS — BP 120/80 | HR 55 | Temp 98.2°F | Wt 240.0 lb

## 2022-05-26 DIAGNOSIS — R109 Unspecified abdominal pain: Secondary | ICD-10-CM | POA: Diagnosis not present

## 2022-05-26 MED ORDER — HYDROMORPHONE HCL 2 MG PO TABS: 1.0000 mg | ORAL_TABLET | Freq: Two times a day (BID) | ORAL | 0 refills | Status: AC | PRN

## 2022-05-26 MED ORDER — GABAPENTIN 300 MG PO CAPS: 600.0000 mg | ORAL_CAPSULE | Freq: Every day | ORAL | Status: DC

## 2022-05-26 NOTE — Patient Instructions (Signed)
Go to the lab on the way out.   If you have mychart we'll likely use that to update you.    Continue augmentin.  Dilaudid if needed for pain.  If fever, more pain, bloody stools, etc, then to ER.  Update me tomorrow.  Take care.  Glad to see you.

## 2022-05-26 NOTE — Progress Notes (Unsigned)
Recently started augmentin.  Had been on gabapentin and naprosyn for knee pain after prev R knee replacement.      R knee surgery was 04/16/22.  ~6 weeks out from surgery.  His knee is better and he is still in therapy.  Prev L knee surgery d/w pt.    ER 05/03/22.  Discussed with patient about previous pain, diverticulosis diagnosis, treatment and imaging.  IMPRESSION: 1. Moderate Acute Diverticulitis of the sigmoid colon. No perforation, abscess, or other complicating features at this time. 2. Normal appendix and no other acute or inflammatory process identified in the abdomen or pelvis. Chronic nephrolithiasis. 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 Mild calcified aortic atherosclerosis.  He clearly improved on augmentin and he was back to baseline, with normal BMs.  No blood in stools.  Then sx got worse in the last few week or so, worse since the weekend.  Started augmentin yesterday.  On clear liquid diet.  This feels like prev diverticulitis.  No temps above 100.  He had chills last night.  No vomiting.  Nausea.  No blood in stool.  BM here today at [place].  Diarrhea/loose.  No black stools.  Burning with urination some of the time with urination but not all of the time. RLQ and lower midline abd pain.    He has been able to tolerate dilaudid in the past.   Meds, vitals, and allergies reviewed.   ROS: Per HPI unless specifically indicated in ROS section   GEN: nad, alert and oriented, uncomfortable but nontoxic-appearing HEENT: ncat CV: rrr.  PULM: ctab, no inc wob ABD: soft, +bs, lower abdomen tender to palpation, midline and right lower quadrant. EXT: no edema SKIN: no acute rash Bilateral vertical anterior knee scars noted.  Well-healed.  30 minutes were devoted to patient care in this encounter (this includes time spent reviewing the patient's file/history, interviewing and examining the patient, counseling/reviewing plan with patient).

## 2022-05-27 ENCOUNTER — Emergency Department
Admission: EM | Admit: 2022-05-27 | Discharge: 2022-05-27 | Disposition: A | Discharge status: Home / Self Care | Payer: Managed Care, Other (non HMO) | Attending: Emergency Medicine | Admitting: Emergency Medicine

## 2022-05-27 ENCOUNTER — Encounter: Payer: Self-pay | Admitting: Intensive Care

## 2022-05-27 ENCOUNTER — Emergency Department: Payer: Managed Care, Other (non HMO)

## 2022-05-27 ENCOUNTER — Other Ambulatory Visit: Payer: Self-pay

## 2022-05-27 DIAGNOSIS — R1031 Right lower quadrant pain: Secondary | ICD-10-CM | POA: Diagnosis present

## 2022-05-27 DIAGNOSIS — K5790 Diverticulosis of intestine, part unspecified, without perforation or abscess without bleeding: Secondary | ICD-10-CM | POA: Diagnosis not present

## 2022-05-27 DIAGNOSIS — R109 Unspecified abdominal pain: Secondary | ICD-10-CM | POA: Insufficient documentation

## 2022-05-27 DIAGNOSIS — K5792 Diverticulitis of intestine, part unspecified, without perforation or abscess without bleeding: Secondary | ICD-10-CM

## 2022-05-27 LAB — COMPREHENSIVE METABOLIC PANEL
ALT: 10 U/L (ref 0–53)
ALT: 11 U/L (ref 0–44)
AST: 17 U/L (ref 0–37)
AST: 18 U/L (ref 15–41)
Albumin: 4.3 g/dL (ref 3.5–5.2)
Albumin: 4.1 g/dL (ref 3.5–5.0)
Alkaline Phosphatase: 76 U/L (ref 39–117)
Alkaline Phosphatase: 73 U/L (ref 38–126)
Anion gap: 7 (ref 5–15)
BUN: 13 mg/dL (ref 6–23)
BUN: 15 mg/dL (ref 6–20)
CO2: 31 mEq/L (ref 19–32)
CO2: 29 mmol/L (ref 22–32)
Calcium: 9.5 mg/dL (ref 8.4–10.5)
Calcium: 9.2 mg/dL (ref 8.9–10.3)
Chloride: 100 mEq/L (ref 96–112)
Chloride: 102 mmol/L (ref 98–111)
Creatinine, Ser: 0.82 mg/dL (ref 0.40–1.50)
Creatinine, Ser: 0.78 mg/dL (ref 0.61–1.24)
GFR, Estimated: >60 mL/min (ref >60)
GFR: 97.3 mL/min (ref >60.00)
Glucose, Bld: 88 mg/dL (ref 70–99)
Glucose, Bld: 103 mg/dL — ABNORMAL HIGH (ref 70–99)
Potassium: 4.7 mEq/L (ref 3.5–5.1)
Potassium: 4.6 mmol/L (ref 3.5–5.1)
Sodium: 138 mEq/L (ref 135–145)
Sodium: 138 mmol/L (ref 135–145)
Total Bilirubin: 1.2 mg/dL (ref 0.2–1.2)
Total Bilirubin: 1.2 mg/dL (ref 0.3–1.2)
Total Protein: 6.7 g/dL (ref 6.0–8.3)
Total Protein: 7.1 g/dL (ref 6.5–8.1)

## 2022-05-27 LAB — CBC
HCT: 43.2 % (ref 39.0–52.0)
Hemoglobin: 14.4 g/dL (ref 13.0–17.0)
MCH: 31.2 pg (ref 26.0–34.0)
MCHC: 33.3 g/dL (ref 30.0–36.0)
MCV: 93.7 fL (ref 80.0–100.0)
Platelets: 195 10*3/uL (ref 150–400)
RBC: 4.61 MIL/uL (ref 4.22–5.81)
RDW: 11.7 % (ref 11.5–15.5)
WBC: 6.1 10*3/uL (ref 4.0–10.5)
nRBC: 0 % (ref 0.0–0.2)

## 2022-05-27 LAB — CBC WITH DIFFERENTIAL/PLATELET
Basophils Absolute: 0 10*3/uL (ref 0.0–0.1)
Basophils Relative: 0.6 % (ref 0.0–3.0)
Eosinophils Absolute: 0.1 10*3/uL (ref 0.0–0.7)
Eosinophils Relative: 1.8 % (ref 0.0–5.0)
HCT: 41.4 % (ref 39.0–52.0)
Hemoglobin: 14.2 g/dL (ref 13.0–17.0)
Lymphocytes Relative: 28.3 % (ref 12.0–46.0)
Lymphs Abs: 2.1 10*3/uL (ref 0.7–4.0)
MCHC: 34.2 g/dL (ref 30.0–36.0)
MCV: 93.5 fl (ref 78.0–100.0)
Monocytes Absolute: 0.5 10*3/uL (ref 0.1–1.0)
Monocytes Relative: 7.5 % (ref 3.0–12.0)
Neutro Abs: 4.5 10*3/uL (ref 1.4–7.7)
Neutrophils Relative %: 61.8 % (ref 43.0–77.0)
Platelets: 185 10*3/uL (ref 150.0–400.0)
RBC: 4.43 Mil/uL (ref 4.22–5.81)
RDW: 12.8 % (ref 11.5–15.5)
WBC: 7.3 10*3/uL (ref 4.0–10.5)

## 2022-05-27 LAB — URINALYSIS, ROUTINE W REFLEX MICROSCOPIC
Bilirubin Urine: NEGATIVE
Bilirubin Urine: NEGATIVE
Glucose, UA: NEGATIVE mg/dL
Hgb urine dipstick: NEGATIVE
Hgb urine dipstick: NEGATIVE
Ketones, ur: NEGATIVE
Ketones, ur: NEGATIVE mg/dL
Leukocytes,Ua: NEGATIVE
Leukocytes,Ua: NEGATIVE
Nitrite: NEGATIVE
Nitrite: NEGATIVE
Protein, ur: NEGATIVE mg/dL
RBC / HPF: NONE SEEN (ref 0–?)
Specific Gravity, Urine: 1.02 (ref 1.000–1.030)
Specific Gravity, Urine: 1.013 (ref 1.005–1.030)
Total Protein, Urine: NEGATIVE
Urine Glucose: NEGATIVE
Urobilinogen, UA: 0.2 (ref 0.0–1.0)
pH: 6.5 (ref 5.0–8.0)
pH: 7 (ref 5.0–8.0)

## 2022-05-27 LAB — LIPASE: Lipase: 15 U/L (ref 11.0–59.0)

## 2022-05-27 LAB — LIPASE, BLOOD: Lipase: 27 U/L (ref 11–51)

## 2022-05-27 MED ORDER — CIPROFLOXACIN HCL 500 MG PO TABS: 500.0000 mg | ORAL_TABLET | Freq: Two times a day (BID) | ORAL | 0 refills | Status: DC

## 2022-05-27 MED ORDER — FENTANYL CITRATE PF 50 MCG/ML IJ SOSY
25.0000 ug | PREFILLED_SYRINGE | Freq: Once | INTRAMUSCULAR | Status: AC
  Administered 2022-05-27: 25 ug via INTRAVENOUS
  Filled 2022-05-27: qty 1

## 2022-05-27 MED ORDER — METRONIDAZOLE 500 MG PO TABS: 500.0000 mg | ORAL_TABLET | Freq: Two times a day (BID) | ORAL | 0 refills | Status: DC

## 2022-05-27 MED ORDER — IOHEXOL 300 MG/ML  SOLN
100.0000 mL | Freq: Once | INTRAMUSCULAR | Status: AC | PRN
  Administered 2022-05-27: 100 mL via INTRAVENOUS

## 2022-05-27 MED ORDER — ONDANSETRON HCL 4 MG/2ML IJ SOLN
4.0000 mg | Freq: Once | INTRAMUSCULAR | Status: AC
  Administered 2022-05-27: 4 mg via INTRAVENOUS
  Filled 2022-05-27: qty 2

## 2022-05-27 NOTE — ED Triage Notes (Signed)
Patient reports diagnosed with diverticulitis 05/03/22. C/o abdominal pain and not getting any better.

## 2022-05-27 NOTE — ED Provider Triage Note (Signed)
Emergency Medicine Provider Triage Evaluation Note  Frank Strickland , a 56 y.o. male  was evaluated in triage.  Pt complains of right sided back pain and abdominal pain. Not better from 05/03/22.  Hx of diverticulitis.   Review of Systems  Positive: Fever, abdominal pain Negative: No bloody stools  Physical Exam  BP 128/84 (BP Location: Left Arm)   Pulse 64   Temp 98.7 F (37.1 C) (Oral)   Resp 16   Ht 5\' 8"  (1.727 m)   Wt 108.9 kg   SpO2 94%   BMI 36.49 kg/m  Gen:   Awake, no distress   Resp:  Normal effort  MSK:   Moves extremities without difficulty  Other:   Abd soft with tenderness generalized with point tender RLQ area.   Medical Decision Making  Medically screening exam initiated at 10:32 AM.  Appropriate orders placed.  Frank Strickland was informed that the remainder of the evaluation will be completed by another provider, this initial triage assessment does not replace that evaluation, and the importance of remaining in the ED until their evaluation is complete.     Frank Hai, PA-C 05/27/22 1034

## 2022-05-27 NOTE — Telephone Encounter (Signed)
Pt was seen by Dr Damita Dunnings on 05/26/22. Pt wife calling (DPR signed) pt is unable to rest; pt having same lower abd pain as when seen on 05/26/22 pain level now is 6. The abd pain is sharp and is no worse but no better. When pt tries to eat he gets nauseated. No blood in BM seen. Not sure if pt has fever due to not taking temp. Pt does not feel warm. Pts wife calling to let Dr Damita Dunnings know update and they are leaving now to go to [facility] ED.Marland Kitchen Sending note to Dr Damita Dunnings who is out of office and Janett Billow CMA.

## 2022-05-27 NOTE — Telephone Encounter (Signed)
Agreed.  We talked about this  possibility at the office visit yesterday.  If he was not getting better then he was going to go back to the emergency room.  I appreciate the update.  Please get update on patient tomorrow.  I will await the emergency room note.  Thanks.

## 2022-05-27 NOTE — Assessment & Plan Note (Signed)
Presumed diverticulitis.  This point still okay for outpatient follow-up.  Continue clear liquid diet. Continue augmentin.  Dilaudid if needed for pain.  If fever, more pain, bloody stools, etc, then to ER.  Update me tomorrow.  He agrees with plan.  He is aware that he may end up needing emergency room reevaluation.  We agreed to defer imaging at this point as it would likely not change the plan.

## 2022-05-27 NOTE — ED Provider Notes (Signed)
[facility] Provider Note    Event Date/Time   First MD Initiated Contact with Patient 05/27/22 1155     (approximate)   History   Abdominal Pain   HPI  Frank Strickland is a 56 y.o. male who presents with complaints of right lower quadrant abdominal pain.  Patient reports about 3 weeks ago he was diagnosed with diverticulitis, finished his Augmentin and then was feeling much better.  Over the last several days he has developed mild pain in the right lower quadrant again.  He does have mild nausea.  No fevers reported     Physical Exam   Triage Vital Signs: ED Triage Vitals [05/27/22 1026]  Enc Vitals Group     BP 128/84     Pulse Rate 64     Resp 16     Temp 98.7 F (37.1 C)     Temp Source Oral     SpO2 94 %     Weight 108.9 kg (240 lb)     Height 1.727 m (5\' 8" )     Head Circumference      Peak Flow      Pain Score 6     Pain Loc      Pain Edu?      Excl. in [place]?     Most recent vital signs: Vitals:   05/27/22 1026  BP: 128/84  Pulse: 64  Resp: 16  Temp: 98.7 F (37.1 C)  SpO2: 94%     General: Awake, no distress.  CV:  Good peripheral perfusion.  Resp:  Normal effort.  Abd:  No distention.  Mild tenderness right lower quadrant Other:     ED Results / Procedures / Treatments   Labs (all labs ordered are listed, but only abnormal results are displayed) Labs Reviewed  COMPREHENSIVE METABOLIC PANEL - Abnormal; Notable for the following components:      Result Value   Glucose, Bld 103 (*)    All other components within normal limits  URINALYSIS, ROUTINE W REFLEX MICROSCOPIC - Abnormal; Notable for the following components:   Color, Urine YELLOW (*)    APPearance CLEAR (*)    All other components within normal limits  LIPASE, BLOOD  CBC     EKG     RADIOLOGY CT scan viewed interpreted by me, suspect diverticulitis right lower quadrant pending radiology review    PROCEDURES:  Critical Care performed:    Procedures   MEDICATIONS ORDERED IN ED: Medications  fentaNYL (SUBLIMAZE) injection 25 mcg (25 mcg Intravenous Given 05/27/22 1232)  ondansetron (ZOFRAN) injection 4 mg (4 mg Intravenous Given 05/27/22 1232)  iohexol (OMNIPAQUE) 300 MG/ML solution 100 mL (100 mLs Intravenous Contrast Given 05/27/22 1255)     IMPRESSION / MDM / ASSESSMENT AND PLAN / ED COURSE  I reviewed the triage vital signs and the nursing notes. Patient's presentation is most consistent with acute presentation with potential threat to life or bodily function.  Patient presents with abdominal pain as detailed above.  Location is similar to prior episode of diverticulitis.  Differential includes diverticulitis, diverticular abscess, appendicitis, UTI   Lab work urinalysis overall reassuring, pending CT scan  Patient treated with IV fentanyl, IV Zofran with significant improvement in discomfort  CT scan demonstrates diverticulitis, no evidence of abscess.  Will treat with Cipro Flagyl, close outpatient follow-up, liquid diet laxatives  Patient agrees with plan     FINAL CLINICAL IMPRESSION(S) / ED DIAGNOSES   Final diagnoses:  Diverticulitis  Rx / DC Orders   ED Discharge Orders          Ordered    ciprofloxacin (CIPRO) 500 MG tablet  2 times daily        05/27/22 1332    metroNIDAZOLE (FLAGYL) 500 MG tablet  2 times daily after meals        05/27/22 1332             Note:  This document was prepared using Dragon voice recognition software and may include unintentional dictation errors.   Jene Every, MD 05/27/22 [phone]

## 2022-05-28 NOTE — Telephone Encounter (Signed)
Spoke with patient and he said he is doing much better. He was also able to sleep last night for the first time in a while. Patient is sticking to a clear liquid diet and was given cipro and metronidazole at the hospital.

## 2022-05-28 NOTE — Telephone Encounter (Signed)
Noted.  Thanks.  Glad to hear.  ? ?

## 2022-05-29 ENCOUNTER — Ambulatory Visit: Payer: Managed Care, Other (non HMO) | Admitting: Family Medicine

## 2022-11-02 ENCOUNTER — Other Ambulatory Visit: Payer: Self-pay | Admitting: Family Medicine

## 2022-11-30 ENCOUNTER — Other Ambulatory Visit: Payer: Self-pay | Admitting: Family Medicine

## 2022-12-29 ENCOUNTER — Other Ambulatory Visit: Payer: Self-pay

## 2022-12-29 ENCOUNTER — Encounter: Payer: Self-pay | Admitting: Emergency Medicine

## 2022-12-29 DIAGNOSIS — R1032 Left lower quadrant pain: Secondary | ICD-10-CM | POA: Diagnosis present

## 2022-12-29 DIAGNOSIS — D72829 Elevated white blood cell count, unspecified: Secondary | ICD-10-CM | POA: Diagnosis not present

## 2022-12-29 DIAGNOSIS — K5732 Diverticulitis of large intestine without perforation or abscess without bleeding: Secondary | ICD-10-CM | POA: Diagnosis not present

## 2022-12-29 LAB — COMPREHENSIVE METABOLIC PANEL
ALT: 14 U/L (ref 0–44)
AST: 23 U/L (ref 15–41)
Albumin: 4 g/dL (ref 3.5–5.0)
Alkaline Phosphatase: 68 U/L (ref 38–126)
Anion gap: 9 (ref 5–15)
BUN: 19 mg/dL (ref 6–20)
CO2: 27 mmol/L (ref 22–32)
Calcium: 8.8 mg/dL — ABNORMAL LOW (ref 8.9–10.3)
Chloride: 100 mmol/L (ref 98–111)
Creatinine, Ser: 0.88 mg/dL (ref 0.61–1.24)
GFR, Estimated: >60 mL/min (ref >60)
Glucose, Bld: 114 mg/dL — ABNORMAL HIGH (ref 70–99)
Potassium: 3.8 mmol/L (ref 3.5–5.1)
Sodium: 136 mmol/L (ref 135–145)
Total Bilirubin: 1.7 mg/dL — ABNORMAL HIGH (ref 0.3–1.2)
Total Protein: 6.9 g/dL (ref 6.5–8.1)

## 2022-12-29 LAB — CBC
HCT: 42.4 % (ref 39.0–52.0)
Hemoglobin: 14.9 g/dL (ref 13.0–17.0)
MCH: 33.3 pg (ref 26.0–34.0)
MCHC: 35.1 g/dL (ref 30.0–36.0)
MCV: 94.6 fL (ref 80.0–100.0)
Platelets: 181 10*3/uL (ref 150–400)
RBC: 4.48 MIL/uL (ref 4.22–5.81)
RDW: 11 % — ABNORMAL LOW (ref 11.5–15.5)
WBC: 10.7 10*3/uL — ABNORMAL HIGH (ref 4.0–10.5)
nRBC: 0 % (ref 0.0–0.2)

## 2022-12-29 LAB — URINALYSIS, ROUTINE W REFLEX MICROSCOPIC
Bilirubin Urine: NEGATIVE
Glucose, UA: NEGATIVE mg/dL
Hgb urine dipstick: NEGATIVE
Ketones, ur: NEGATIVE mg/dL
Leukocytes,Ua: NEGATIVE
Nitrite: NEGATIVE
Protein, ur: NEGATIVE mg/dL
Specific Gravity, Urine: 1.018 (ref 1.005–1.030)
pH: 6 (ref 5.0–8.0)

## 2022-12-29 LAB — LIPASE, BLOOD: Lipase: 56 U/L — ABNORMAL HIGH (ref 11–51)

## 2022-12-29 NOTE — ED Triage Notes (Addendum)
Pt arrived via POV with reports of lower abd pain and R low back pain x 2 days, pt reports pain with urination as well.  Pt c/o rectal pain thinks it is his prostate.  Pt reports hx of diverticulitis, states the pain is similar but reports he has back pain which is not usual for him with diverticulitis.  Pt also reports hx of kidney stones years ago.   Pt reports diarrhea x 2 days. + nausea

## 2022-12-30 ENCOUNTER — Emergency Department: Payer: Managed Care, Other (non HMO)

## 2022-12-30 ENCOUNTER — Emergency Department
Admission: EM | Admit: 2022-12-30 | Discharge: 2022-12-30 | Disposition: A | Discharge status: Home / Self Care | Payer: Managed Care, Other (non HMO) | Attending: Emergency Medicine | Admitting: Emergency Medicine

## 2022-12-30 DIAGNOSIS — K5792 Diverticulitis of intestine, part unspecified, without perforation or abscess without bleeding: Secondary | ICD-10-CM

## 2022-12-30 MED ORDER — PIPERACILLIN-TAZOBACTAM 3.375 G IVPB 30 MIN
3.3750 g | Freq: Once | INTRAVENOUS | Status: AC
  Administered 2022-12-30: 3.375 g via INTRAVENOUS
  Filled 2022-12-30: qty 50

## 2022-12-30 MED ORDER — AMOXICILLIN-POT CLAVULANATE 875-125 MG PO TABS: 1.0000 | ORAL_TABLET | Freq: Three times a day (TID) | ORAL | 0 refills | Status: AC

## 2022-12-30 MED ORDER — ONDANSETRON 4 MG PO TBDP: ORAL_TABLET | ORAL | 0 refills | Status: DC

## 2022-12-30 MED ORDER — TRAMADOL HCL 50 MG PO TABS: 100.0000 mg | ORAL_TABLET | Freq: Four times a day (QID) | ORAL | 0 refills | Status: DC | PRN

## 2022-12-30 MED ORDER — FENTANYL CITRATE PF 50 MCG/ML IJ SOSY
50.0000 ug | PREFILLED_SYRINGE | Freq: Once | INTRAMUSCULAR | Status: AC
  Administered 2022-12-30: 50 ug via INTRAVENOUS
  Filled 2022-12-30: qty 1

## 2022-12-30 MED ORDER — ONDANSETRON HCL 4 MG/2ML IJ SOLN
4.0000 mg | INTRAMUSCULAR | Status: AC
  Administered 2022-12-30: 4 mg via INTRAVENOUS
  Filled 2022-12-30: qty 2

## 2022-12-30 MED ORDER — IOHEXOL 350 MG/ML SOLN
100.0000 mL | Freq: Once | INTRAVENOUS | Status: AC | PRN
  Administered 2022-12-30: 100 mL via INTRAVENOUS

## 2022-12-30 NOTE — ED Provider Notes (Signed)
[facility] Provider Note    Event Date/Time   First MD Initiated Contact with Patient 12/30/22 0111     (approximate)   History   Abdominal Pain   HPI Frank Strickland is a 57 y.o. male whose medical history includes prior episode(s) of diverticulitis.  He presents tonight for evaluation of about a week of lower abdominal pain primarily in the left lower quadrant.  It is accompanied with nausea, no vomiting, but multiple episodes of diarrhea.  It feels very similar to his prior diverticulitis.  He was hoping it was going to improve on its own but it did not.  He has not had any fever, chest pain, nor shortness of breath.  Decreased appetite due to the nausea.  No recent trauma.     Physical Exam   Triage Vital Signs: ED Triage Vitals  Enc Vitals Group     BP 12/29/22 2326 (!) 141/90     Pulse Rate 12/29/22 2326 (!) 57     Resp 12/29/22 2326 18     Temp 12/29/22 2326 (!) 97.5 F (36.4 C)     Temp Source 12/29/22 2326 Oral     SpO2 12/29/22 2326 95 %     Weight 12/29/22 2324 108.9 kg (240 lb)     Height 12/29/22 2324 1.778 m (5\' 10" )     Head Circumference --      Peak Flow --      Pain Score 12/29/22 2324 7     Pain Loc --      Pain Edu? --      Excl. in [place]? --     Most recent vital signs: Vitals:   12/30/22 0204 12/30/22 0330  BP: (!) 158/101 (!) 154/75  Pulse: (!) 54 60  Resp: (!) 22 16  Temp:    SpO2: 96% 96%    General: Awake, appears uncomfortable but nontoxic. CV:  Good peripheral perfusion.  Regular rate and rhythm. Resp:  Normal effort. Speaking easily and comfortably, no accessory muscle usage nor intercostal retractions.   Abd:  No distention.  Obese, tender to palpation with guarding in the left lower quadrant but not peritoneal.  No upper abdominal tenderness.   ED Results / Procedures / Treatments   Labs (all labs ordered are listed, but only abnormal results are displayed) Labs Reviewed  LIPASE, BLOOD - Abnormal;  Notable for the following components:      Result Value   Lipase 56 (*)    All other components within normal limits  COMPREHENSIVE METABOLIC PANEL - Abnormal; Notable for the following components:   Glucose, Bld 114 (*)    Calcium 8.8 (*)    Total Bilirubin 1.7 (*)    All other components within normal limits  CBC - Abnormal; Notable for the following components:   WBC 10.7 (*)    RDW 11.0 (*)    All other components within normal limits  URINALYSIS, ROUTINE W REFLEX MICROSCOPIC - Abnormal; Notable for the following components:   Color, Urine YELLOW (*)    APPearance CLEAR (*)    All other components within normal limits     RADIOLOGY I viewed and interpreted the patient's CT of the abdomen and pelvis with IV contrast.  Patient appears to have inflammation in the colon consistent with diverticulitis.  Radiology report confirms diverticulitis without evidence of perforation nor abscess.   PROCEDURES:  Critical Care performed: No  Procedures    IMPRESSION / MDM / ASSESSMENT AND  PLAN / ED COURSE  I reviewed the triage vital signs and the nursing notes.                              Differential diagnosis includes, but is not limited to, diverticulitis, SBO/ileus, epiploic appendagitis, appendicitis.  Patient's presentation is most consistent with acute presentation with potential threat to life or bodily function.  Labs/studies ordered: CT of the abdomen and pelvis, CBC, CMP, lipase, urinalysis  Interventions/Medications given:  Medications  fentaNYL (SUBLIMAZE) injection 50 mcg (50 mcg Intravenous Given 12/30/22 0203)  ondansetron (ZOFRAN) injection 4 mg (4 mg Intravenous Given 12/30/22 0203)  iohexol (OMNIPAQUE) 350 MG/ML injection 100 mL (100 mLs Intravenous Contrast Given 12/30/22 0219)  piperacillin-tazobactam (ZOSYN) IVPB 3.375 g (0 g Intravenous Stopped 12/30/22 0329)    (Note:  hospital course my include additional interventions and/or labs/studies not listed  above.)   Strongly suspect diverticulitis.  Very minimal leukocytosis of 10.7 and otherwise reassuring metabolic panel.  Lipase is slightly elevated but I suspect this is due to the other constellation of symptoms rather than representing pancreatitis.  He is quite uncomfortable so ordered fentanyl (alleges swelling to morphine) and Zofran.  He does not appear volume depleted nor is he tachycardic so we will hold off on fluids.  CT scan pending.  Anticipate outpatient management with antibiotics unless the patient has perforation or abscess.     Clinical Course as of 12/30/22 0446  Wed Dec 30, 2022  0303 CT ABDOMEN PELVIS W CONTRAST As documented above, CT scan shows simple diverticulitis.  Given the patient's pain earlier (it has improved a lot after the morphine), I am going to give him an initial dose of Zosyn 3.375 g IV to begin treatment.  I will then discharge him on Augmentin and other medications as listed below.  He and his wife understand and agree with the plan.  I gave strict return precautions. [CF]    Clinical Course User Index [CF] Loleta Rose, MD     FINAL CLINICAL IMPRESSION(S) / ED DIAGNOSES   Final diagnoses:  Diverticulitis     Rx / DC Orders   ED Discharge Orders          Ordered    traMADol (ULTRAM) 50 MG tablet  Every 6 hours PRN        12/30/22 0324    ondansetron (ZOFRAN-ODT) 4 MG disintegrating tablet        12/30/22 0324    amoxicillin-clavulanate (AUGMENTIN) 875-125 MG tablet  Every 8 hours        12/30/22 0324             Note:  This document was prepared using Dragon voice recognition software and may include unintentional dictation errors.   Loleta Rose, MD 12/30/22 [phone]

## 2022-12-30 NOTE — Discharge Instructions (Signed)
We believe your symptoms are caused by diverticulitis.  Most of the time this condition (please read through the included information) can be cured with outpatient antibiotics.  Please take the full course of prescribed medication(s) and follow up with the doctors recommended above.  Return to the ED if your abdominal pain worsens or fails to improve, you develop bloody vomiting, bloody diarrhea, you are unable to tolerate fluids due to vomiting, fever greater than 101, or other symptoms that concern you.  Take Tramdol as prescribed for severe pain. Do not drink alcohol, drive or participate in any other potentially dangerous activities while taking this medication as it may make you sleepy. Do not take this medication with any other sedating medications, either prescription or over-the-counter. If you were prescribed Percocet or Vicodin, do not take these with acetaminophen (Tylenol) as it is already contained within these medications.   This medication is an opiate (or narcotic) pain medication and can be habit forming.  Use it as little as possible to achieve adequate pain control.  Do not use or use it with extreme caution if you have a history of opiate abuse or dependence.  If you are on a pain contract with your primary care doctor or a pain specialist, be sure to let them know you were prescribed this medication today from the [facility] Emergency Department.  This medication is intended for your use only - do not give any to anyone else and keep it in a secure place where nobody else, especially children, have access to it.  It will also cause or worsen constipation, so you may want to consider taking an over-the-counter stool softener while you are taking this medication.

## 2022-12-30 NOTE — ED Notes (Signed)
Pt to ct 

## 2022-12-31 ENCOUNTER — Telehealth: Payer: Self-pay

## 2022-12-31 NOTE — Transitions of Care (Post Inpatient/ED Visit) (Signed)
   12/31/2022  Name: Frank Strickland MRN: [id number] DOB: [birth year]  Today's TOC FU Call Status: Today's TOC FU Call Status:: Successful TOC FU Call Competed TOC FU Call Complete Date: 12/31/22  Transition Care Management Follow-up Telephone Call Date of Discharge: 12/30/22 Discharge Facility: [facility]) Type of Discharge: Emergency Department Reason for ED Visit: Other: How have you been since you were released from the hospital?: Better Any questions or concerns?: No  Items Reviewed: Did you receive and understand the discharge instructions provided?: Yes Medications obtained,verified, and reconciled?: Yes (Medications Reviewed) Any new allergies since your discharge?: No Dietary orders reviewed?: Yes Type of Diet Ordered:: bland diet Do you have support at home?: Yes People in Home: spouse  Medications Reviewed Today: Medications Reviewed Today     Reviewed by Annabell Sabal, CMA (Certified Medical Assistant) on 12/31/22 at 1320  Med List Status: <None>   Medication Order Taking? Sig Documenting Provider Last Dose Status Informant  amoxicillin-clavulanate (AUGMENTIN) 875-125 MG tablet [id number] Yes Take 1 tablet by mouth every 8 (eight) hours for 7 days. Loleta Rose, MD Taking Active   Cholecalciferol 1000 units TBDP [id number] Yes Take 1 tablet by mouth daily. Joaquim Nam, MD Taking Active   FLUoxetine (PROZAC) 20 MG capsule [id number] Yes TAKE 1 CAPSULE(20 MG) BY MOUTH DAILY Joaquim Nam, MD Taking Active   fluticasone [facility]) 50 MCG/ACT nasal spray [id number] Yes Place 2 sprays into both nostrils daily. Kerrie Buffalo M, NP Taking Active   gabapentin (NEURONTIN) 300 MG capsule [id number] Yes Take 2 capsules (600 mg total) by mouth at bedtime. Joaquim Nam, MD Taking Active   hydrochlorothiazide (HYDRODIURIL) 25 MG tablet [id number] Yes TAKE 1 TABLET(25 MG) BY MOUTH DAILY Joaquim Nam, MD Taking Active   Multiple Vitamin  (MULTIVITAMIN) tablet [id number] Yes Take 1 tablet by mouth daily. [provider] Taking Active   naproxen (NAPROSYN) 500 MG tablet [id number] Yes Take 1 tablet (500 mg total) by mouth 2 (two) times daily as needed (with food). Joaquim Nam, MD Taking Active   ondansetron (ZOFRAN-ODT) 4 MG disintegrating tablet [id number] Yes Allow 1-2 tablets to dissolve in your mouth every 8 hours as needed for nausea/vomiting Loleta Rose, MD Taking Active   traMADol Janean Sark) 50 MG tablet [id number] Yes Take 2 tablets (100 mg total) by mouth every 6 (six) hours as needed for moderate pain or severe pain. Loleta Rose, MD Taking Active             Home Care and Equipment/Supplies: Were Home Health Services Ordered?: No Any new equipment or medical supplies ordered?: No  Functional Questionnaire: Do you need assistance with bathing/showering or dressing?: No Do you need assistance with meal preparation?: No Do you need assistance with eating?: No Do you have difficulty maintaining continence: No Do you need assistance with getting out of bed/getting out of a chair/moving?: No Do you have difficulty managing or taking your medications?: No  Follow up appointments reviewed: PCP Follow-up appointment confirmed?: Yes Date of PCP follow-up appointment?: 01/05/23 Follow-up Provider: [facility] Follow-up appointment confirmed?: NA Do you need transportation to your follow-up appointment?: No Do you understand care options if your condition(s) worsen?: Yes-patient verbalized understanding    SIGNATURE Nadia Viar,CMA [facility], [facility]

## 2023-01-01 ENCOUNTER — Encounter: Payer: Self-pay | Admitting: Family Medicine

## 2023-01-01 NOTE — Telephone Encounter (Signed)
Called pt, d/w pt about trying to skip one dose of augmentin today, then resume TID dosing when possible.  Continue with fluids.  He is improved from ER eval condition and still okay for outpatient f/u.  Update [place] as needed.  He agrees with plan.

## 2023-01-05 ENCOUNTER — Ambulatory Visit: Payer: Managed Care, Other (non HMO) | Admitting: Family Medicine

## 2023-01-05 ENCOUNTER — Encounter: Payer: Self-pay | Admitting: Family Medicine

## 2023-01-05 VITALS — BP 110/74 | HR 60 | Temp 97.8°F | Ht 70.0 in | Wt 232.0 lb

## 2023-01-05 DIAGNOSIS — K5792 Diverticulitis of intestine, part unspecified, without perforation or abscess without bleeding: Secondary | ICD-10-CM | POA: Diagnosis not present

## 2023-01-05 DIAGNOSIS — F419 Anxiety disorder, unspecified: Secondary | ICD-10-CM

## 2023-01-05 MED ORDER — ONDANSETRON 4 MG PO TBDP: ORAL_TABLET | ORAL | 0 refills | Status: DC

## 2023-01-05 MED ORDER — FLUOXETINE HCL 40 MG PO CAPS: 40.0000 mg | ORAL_CAPSULE | Freq: Every day | ORAL | 3 refills | Status: DC

## 2023-01-05 NOTE — Patient Instructions (Signed)
Increase prozac to 40mg  a day and let me know if that isn't helping.  Finish the augmentin and update me as needed.  Take care.  Glad to see you.

## 2023-01-05 NOTE — Progress Notes (Unsigned)
ER f/u.  He is better but not resolved.  Still with some nausea.  Imaging/CT reviewed with patient.  Went back to work yesterday.  Was able to tolerate that.  Abd pain is better.  No fevers now but did last week.  Zofran helped with nausea in the meantime.  3rd episode lifetime of diverticulitis.  Mood d/w pt.  Work stressors d/w pt.  Still on prozac at baseline.  More anxiety, irritable.  Prozac prev helped.  No SI/HI.  Has been compliant with medication.  Meds, vitals, and allergies reviewed.   ROS: Per HPI unless specifically indicated in ROS section   Nad Ncat Neck supple, no LA Rrr Ctab Abdomen soft. R side of abd sore but no rebound.   Skin well-perfused. Speech and judgment normal.  Affect normal.  30 minutes were devoted to patient care in this encounter (this includes time spent reviewing the patient's file/history, interviewing and examining the patient, counseling/reviewing plan with patient).

## 2023-01-06 DIAGNOSIS — K5792 Diverticulitis of intestine, part unspecified, without perforation or abscess without bleeding: Secondary | ICD-10-CM | POA: Insufficient documentation

## 2023-01-06 NOTE — Assessment & Plan Note (Signed)
Improving, not yet back to baseline.  Should continue to improve. Finish the augmentin and update me as needed.

## 2023-01-06 NOTE — Assessment & Plan Note (Signed)
Work stressors discussed with patient.  Okay for outpatient follow-up. Increase prozac to 40mg  a day and let me know if that isn't helping.  He agrees with plan.

## 2023-03-12 ENCOUNTER — Telehealth: Payer: Managed Care, Other (non HMO) | Admitting: Nurse Practitioner

## 2023-04-26 ENCOUNTER — Other Ambulatory Visit: Payer: Self-pay | Admitting: Family Medicine

## 2023-04-26 DIAGNOSIS — I10 Essential (primary) hypertension: Secondary | ICD-10-CM

## 2023-04-29 ENCOUNTER — Other Ambulatory Visit (INDEPENDENT_AMBULATORY_CARE_PROVIDER_SITE_OTHER): Payer: Managed Care, Other (non HMO)

## 2023-04-29 DIAGNOSIS — I10 Essential (primary) hypertension: Secondary | ICD-10-CM | POA: Diagnosis not present

## 2023-04-29 LAB — CBC WITH DIFFERENTIAL/PLATELET
Basophils Absolute: 0 10*3/uL (ref 0.0–0.1)
Basophils Relative: 0.6 % (ref 0.0–3.0)
Eosinophils Absolute: 0.2 10*3/uL (ref 0.0–0.7)
Eosinophils Relative: 2.1 % (ref 0.0–5.0)
HCT: 44.9 % (ref 39.0–52.0)
Hemoglobin: 15.1 g/dL (ref 13.0–17.0)
Lymphocytes Relative: 42.6 % (ref 12.0–46.0)
Lymphs Abs: 3.2 10*3/uL (ref 0.7–4.0)
MCHC: 33.6 g/dL (ref 30.0–36.0)
MCV: 96.4 fl (ref 78.0–100.0)
Monocytes Absolute: 0.5 10*3/uL (ref 0.1–1.0)
Monocytes Relative: 7.1 % (ref 3.0–12.0)
Neutro Abs: 3.5 10*3/uL (ref 1.4–7.7)
Neutrophils Relative %: 47.6 % (ref 43.0–77.0)
Platelets: 197 10*3/uL (ref 150.0–400.0)
RBC: 4.66 Mil/uL (ref 4.22–5.81)
RDW: 12.6 % (ref 11.5–15.5)
WBC: 7.4 10*3/uL (ref 4.0–10.5)

## 2023-04-29 LAB — COMPREHENSIVE METABOLIC PANEL
ALT: 12 U/L (ref 0–53)
AST: 20 U/L (ref 0–37)
Albumin: 4 g/dL (ref 3.5–5.2)
Alkaline Phosphatase: 61 U/L (ref 39–117)
BUN: 16 mg/dL (ref 6–23)
CO2: 28 meq/L (ref 19–32)
Calcium: 8.8 mg/dL (ref 8.4–10.5)
Chloride: 102 meq/L (ref 96–112)
Creatinine, Ser: 0.84 mg/dL (ref 0.40–1.50)
GFR: 95.97 mL/min (ref >60.00)
Glucose, Bld: 83 mg/dL (ref 70–99)
Potassium: 4.3 meq/L (ref 3.5–5.1)
Sodium: 139 meq/L (ref 135–145)
Total Bilirubin: 1 mg/dL (ref 0.2–1.2)
Total Protein: 6.5 g/dL (ref 6.0–8.3)

## 2023-04-29 LAB — LIPID PANEL
Cholesterol: 208 mg/dL — ABNORMAL HIGH (ref 0–200)
HDL: 50.2 mg/dL (ref >39.00)
LDL Cholesterol: 135 mg/dL — ABNORMAL HIGH (ref 0–99)
NonHDL: 157.73
Total CHOL/HDL Ratio: 4
Triglycerides: 114 mg/dL (ref 0.0–149.0)
VLDL: 22.8 mg/dL (ref 0.0–40.0)

## 2023-05-06 ENCOUNTER — Encounter: Payer: Self-pay | Admitting: Family Medicine

## 2023-05-06 ENCOUNTER — Ambulatory Visit (INDEPENDENT_AMBULATORY_CARE_PROVIDER_SITE_OTHER): Payer: Managed Care, Other (non HMO) | Admitting: Family Medicine

## 2023-05-06 ENCOUNTER — Other Ambulatory Visit: Payer: Self-pay | Admitting: Family Medicine

## 2023-05-06 VITALS — BP 120/80 | HR 57 | Temp 97.8°F | Ht 68.25 in | Wt 230.0 lb

## 2023-05-06 DIAGNOSIS — F419 Anxiety disorder, unspecified: Secondary | ICD-10-CM

## 2023-05-06 DIAGNOSIS — Z23 Encounter for immunization: Secondary | ICD-10-CM | POA: Diagnosis not present

## 2023-05-06 DIAGNOSIS — Z7189 Other specified counseling: Secondary | ICD-10-CM

## 2023-05-06 DIAGNOSIS — Z0000 Encounter for general adult medical examination without abnormal findings: Secondary | ICD-10-CM | POA: Diagnosis not present

## 2023-05-06 DIAGNOSIS — I10 Essential (primary) hypertension: Secondary | ICD-10-CM

## 2023-05-06 DIAGNOSIS — M25569 Pain in unspecified knee: Secondary | ICD-10-CM

## 2023-05-06 MED ORDER — GABAPENTIN 300 MG PO CAPS: 300.0000 mg | ORAL_CAPSULE | Freq: Every day | ORAL | 1 refills | Status: DC | PRN

## 2023-05-06 MED ORDER — TRAMADOL HCL 50 MG PO TABS: 100.0000 mg | ORAL_TABLET | Freq: Four times a day (QID) | ORAL | 1 refills | Status: AC | PRN

## 2023-05-06 NOTE — Progress Notes (Signed)
CPE- See plan.  Routine anticipatory guidance given to patient.  See health maintenance.  The possibility exists that previously documented standard health maintenance information may have been brought forward from a previous encounter into this note.  If needed, that same information has been updated to reflect the current situation based on today's encounter.    Tetanus shot done 2020 Flu done yearly PNA not due d/w pt.  Shingles prev done.   covid vaccine done Living will d/w pt. Would have his wife designated if patient were incapacitated.  Prostate cancer screening and PSA options (with potential risks and benefits of testing vs not testing) were discussed along with recent recs/guidelines.  He declined testing PSA at this point. Colonoscopy 2021 HAV and HBV vaccine prev done through work.   HIV and HCV screening done ~08/17/2008  with [employer].    He had used gabapentin at night for knee pain/aches.  It helped w/o ADE on med.  Not used every day.  Uses a few times per month.  Rare use of tramadol/naproxen.  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      Mood d/w pt. Still on SSRI.  Mood is good, felt better on current dose.  Still working.    He is careful about diet to limit GI sx.    Hypertension:    Using medication without problems or lightheadedness:yes  Chest pain with exertion:no Edema:no Short of breath:no Statin intolerant.  He is more active and lipids are better.    PMH and SH reviewed  Meds, vitals, and allergies reviewed.   ROS: Per HPI.  Unless specifically indicated otherwise in HPI, the patient denies:  General: fever. Eyes: acute vision changes ENT: sore throat Cardiovascular: chest pain Respiratory: SOB GI: vomiting GU: dysuria Musculoskeletal: acute back pain Derm: acute rash Neuro: acute motor dysfunction Psych: worsening mood Endocrine: polydipsia Heme: bleeding Allergy: hayfever  GEN: nad, alert and oriented HEENT: ncat NECK: supple w/o LA CV: rrr. PULM:  ctab, no inc wob ABD: soft, +bs EXT: no edema SKIN: no acute rash

## 2023-05-06 NOTE — Patient Instructions (Signed)
Take care.  Glad to see you.  Update me as needed.  Thanks for your effort.

## 2023-05-09 ENCOUNTER — Telehealth: Payer: Self-pay | Admitting: Family Medicine

## 2023-05-09 DIAGNOSIS — I77810 Thoracic aortic ectasia: Secondary | ICD-10-CM

## 2023-05-09 NOTE — Assessment & Plan Note (Signed)
Labs discussed with patient.  Continue work on diet and exercise.  Continue hydrochlorothiazide.  Statin intolerant.

## 2023-05-09 NOTE — Assessment & Plan Note (Signed)
He had used gabapentin at night for knee pain/aches.  It helped w/o ADE on med.  Not used every day.  Uses a few times per month.  Rare use of tramadol/naproxen.  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     Would continue as is.

## 2023-05-09 NOTE — Assessment & Plan Note (Signed)
Tetanus shot done 2020 Flu done yearly PNA not due d/w pt.  Shingles prev done.   covid vaccine done Living will d/w pt. Would have his wife designated if patient were incapacitated.  Prostate cancer screening and PSA options (with potential risks and benefits of testing vs not testing) were discussed along with recent recs/guidelines.  He declined testing PSA at this point. Colonoscopy 2021 HAV and HBV vaccine prev done through work.   HIV and HCV screening done ~08/17/2008  with [employer].

## 2023-05-09 NOTE — Telephone Encounter (Signed)
Please check with patient.  I checked one extra issue in his records after the office visit.  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  That has not been rechecked recently.  We can follow this up with an echocardiogram.  I think this is reasonable to get done.  I pended the order below.  If he consents then I can sign the order.  Please let me know.  Thanks.

## 2023-05-09 NOTE — Assessment & Plan Note (Signed)
Still on SSRI.  Mood is good, felt better on current dose.  Still working.  Continue Prozac as is.

## 2023-05-09 NOTE — Assessment & Plan Note (Signed)
Living will d/w pt. Would have his wife designated if patient were incapacitated.

## 2023-05-11 NOTE — Telephone Encounter (Signed)
Called and spoke with patient and he states that this is something that has been seen many times in the past with all the scans he's had and nothing has changed with his aorta. He is going to speak with his wife about all the notes and everything they have on this and will call back. He does not think it needs to be followed up on at this time but will call back and discuss at another time.

## 2023-05-12 ENCOUNTER — Encounter: Payer: Self-pay | Admitting: Family Medicine

## 2023-05-12 NOTE — Telephone Encounter (Signed)
Noted. Thanks.

## 2023-05-19 ENCOUNTER — Other Ambulatory Visit: Payer: Self-pay | Admitting: Family Medicine

## 2023-05-19 DIAGNOSIS — I77810 Thoracic aortic ectasia: Secondary | ICD-10-CM

## 2023-06-09 ENCOUNTER — Ambulatory Visit: Payer: Managed Care, Other (non HMO) | Attending: Family Medicine

## 2023-06-09 ENCOUNTER — Encounter: Payer: Self-pay | Admitting: Family Medicine

## 2023-06-09 DIAGNOSIS — I77810 Thoracic aortic ectasia: Secondary | ICD-10-CM

## 2023-06-09 LAB — ECHOCARDIOGRAM COMPLETE
AR max vel: 3.71 cm2
AV Area VTI: 3.65 cm2
AV Area mean vel: 3.53 cm2
AV Mean grad: 3 mm[Hg]
AV Peak grad: 5 mm[Hg]
Ao pk vel: 1.12 m/s
Area-P 1/2: 2.95 cm2
S' Lateral: 3.4 cm

## 2023-06-13 ENCOUNTER — Other Ambulatory Visit: Payer: Self-pay

## 2023-06-13 ENCOUNTER — Emergency Department
Admission: EM | Admit: 2023-06-13 | Discharge: 2023-06-13 | Disposition: A | Discharge status: Home / Self Care | Payer: Managed Care, Other (non HMO) | Attending: Emergency Medicine | Admitting: Emergency Medicine

## 2023-06-13 DIAGNOSIS — M542 Cervicalgia: Secondary | ICD-10-CM | POA: Insufficient documentation

## 2023-06-13 DIAGNOSIS — I10 Essential (primary) hypertension: Secondary | ICD-10-CM | POA: Insufficient documentation

## 2023-06-13 DIAGNOSIS — M25519 Pain in unspecified shoulder: Secondary | ICD-10-CM | POA: Diagnosis not present

## 2023-06-13 MED ORDER — METHOCARBAMOL 500 MG PO TABS: 500.0000 mg | ORAL_TABLET | Freq: Four times a day (QID) | ORAL | 0 refills | Status: AC

## 2023-06-13 MED ORDER — DEXAMETHASONE SODIUM PHOSPHATE 10 MG/ML IJ SOLN
10.0000 mg | Freq: Once | INTRAMUSCULAR | Status: AC
  Administered 2023-06-13: 10 mg via INTRAMUSCULAR
  Filled 2023-06-13: qty 1

## 2023-06-13 NOTE — ED Provider Notes (Signed)
[facility] Provider Note    Event Date/Time   First MD Initiated Contact with Patient 06/13/23 [phone]     (approximate)   History   Neck Injury   HPI  Frank Strickland is a 57 y.o. male with PMH of hypertension, hyperlipidemia presents for evaluation of neck pain.  Patient states that it began last night and has been worse this morning.  He tried putting ice on his neck and using a massage gun with no relief.  He states he feels like his neck is catching when he tries to turn left and right.  Denies any specific injury and no trauma.      Physical Exam   Triage Vital Signs: ED Triage Vitals  Encounter Vitals Group     BP 06/13/23 0821 (!) 173/99     Systolic BP Percentile --      Diastolic BP Percentile --      Pulse Rate 06/13/23 0821 (!) 57     Resp 06/13/23 0821 19     Temp 06/13/23 0832 98 F (36.7 C)     Temp Source 06/13/23 0832 Oral     SpO2 06/13/23 0821 99 %     Weight 06/13/23 0827 229 lb 15 oz (104.3 kg)     Height 06/13/23 0827 5' 8.5" (1.74 m)     Head Circumference --      Peak Flow --      Pain Score 06/13/23 0820 10     Pain Loc --      Pain Education --      Exclude from Growth Chart --     Most recent vital signs: Vitals:   06/13/23 0821 06/13/23 0832  BP: (!) 173/99   Pulse: (!) 57   Resp: 19   Temp:  98 F (36.7 C)  SpO2: 99%     General: Awake, no distress.  CV:  Good peripheral perfusion.  Resp:  Normal effort.  Abd:  No distention.  Other:  No TTP over the vertebral spines or paraspinal muscles. TTP over the right upper trapezius. ROM limited due to pain, especially with right rotation.    ED Results / Procedures / Treatments   Labs (all labs ordered are listed, but only abnormal results are displayed) Labs Reviewed - No data to display    PROCEDURES:  Critical Care performed: No  Procedures   MEDICATIONS ORDERED IN ED: Medications  dexamethasone (DECADRON) injection 10 mg (has no  administration in time range)     IMPRESSION / MDM / ASSESSMENT AND PLAN / ED COURSE  I reviewed the triage vital signs and the nursing notes.                             57 year old male presents for evaluation of neck and shoulder pain, patient was hypertensive and slightly bradycardic in triage vital signs stable otherwise.  Patient reports being in pain but NAD.  Differential diagnosis includes, but is not limited to, muscle strain, cervical radiculopathy, fracture, degenerative disc disease.  Patient's presentation is most consistent with acute, uncomplicated illness.  Physical exam is consistent with a muscle strain of the neck and shoulder.  I advised patient on over-the-counter pain management using Tylenol and ibuprofen.  I will also prescribe him a muscle relaxer.  He was given Toradol and dexamethasone while in the ED.  He should use heat and gentle massage.  I gave him a  note for work.  He was agreeable to plan, voiced understanding, and was stable at discharge.      FINAL CLINICAL IMPRESSION(S) / ED DIAGNOSES   Final diagnoses:  Neck pain     Rx / DC Orders   ED Discharge Orders          Ordered    methocarbamol (ROBAXIN) 500 MG tablet  4 times daily        06/13/23 0916             Note:  This document was prepared using Dragon voice recognition software and may include unintentional dictation errors.   Cameron Ali, PA-C 06/13/23 1610    Merwyn Katos, MD 06/17/23 [phone]

## 2023-06-13 NOTE — Discharge Instructions (Addendum)
You can take 650 mg of Tylenol and 600 mg of ibuprofen every 6 hours as needed for pain. You can take the muscle relaxer 4 times daily as needed for muscle spasms.  Keep in mind this may make you sleepy so do not drive after taking it.  Use topical pain relievers, heat and the massage gun as needed. Take it easy the next couple of days, no hunting or shooting the bow until you feel better :)

## 2023-06-13 NOTE — ED Notes (Signed)
See triage note  Presents with pain to right side of neck   States he woke up with it yesterday  But pain is ease off  But states it returned this am Pain increases with movement  Denies any fever or trauma

## 2023-06-13 NOTE — ED Triage Notes (Signed)
Pt comes with c/o right sided neck pain and shoulder pain. Pt states all night it was hurting and worse this morning. Pt states it is like a catch in his neck. Pt attempted using a massager with no relief. Pt states nausea. Pt states no recent injuries that he is aware of. Pt able to turn neck a little before it catches really bad.

## 2023-06-15 ENCOUNTER — Telehealth: Payer: Self-pay

## 2023-06-15 NOTE — Telephone Encounter (Signed)
Noted. Thanks.

## 2023-06-15 NOTE — Transitions of Care (Post Inpatient/ED Visit) (Signed)
I spoke with pt; pt seen [facility] ED on 06/13/23 pt had muscle pain and cramping in neck due to pt pulling on something. pt is better and has returned to work today. Methocarbamol and exercises given by ED has helped neck pain a lot.pt will see if continues improvement and if pt needs appt he will cb for appt. Sending note to Dr Para March.     06/15/2023  Name: Frank Strickland MRN: [id number] DOB: [birth year]  Today's TOC FU Call Status: Today's TOC FU Call Status:: Successful TOC FU Call Completed TOC FU Call Complete Date: 06/15/23 Patient's Name and Date of Birth confirmed.  Transition Care Management Follow-up Telephone Call Date of Discharge: 06/13/23 Discharge Facility: [facility] Type of Discharge: Emergency Department Reason for ED Visit: Other: (i spoke with pt; pt seen [facility] ED on 06/13/23 pt had muscle pain and cramping in neck due to pt pulling on something. pt is better and has returned to work today.) How have you been since you were released from the hospital?: Better Any questions or concerns?: No  Items Reviewed: Did you receive and understand the discharge instructions provided?: Yes Medications obtained,verified, and reconciled?: Partial Review Completed Reason for Partial Mediation Review: pt reviewed methocarbamol 500 mg qid given byED. Any new allergies since your discharge?: No Dietary orders reviewed?: NA Do you have support at home?: Yes People in Home: spouse Name of Support/Comfort Primary Source: Frank Strickland  Medications Reviewed Today: Medications Reviewed Today   Medications were not reviewed in this encounter     Home Care and Equipment/Supplies: Were Home Health Services Ordered?: NA Any new equipment or medical supplies ordered?: NA  Functional Questionnaire: Do you need assistance with bathing/showering or dressing?: No Do you need assistance with meal preparation?: No Do you need assistance with eating?: No Do you have  difficulty maintaining continence: No Do you need assistance with getting out of bed/getting out of a chair/moving?: No Do you have difficulty managing or taking your medications?: No  Follow up appointments reviewed: PCP Follow-up appointment confirmed?: NA Specialist Hospital Follow-up appointment confirmed?: NA Do you need transportation to your follow-up appointment?: No Do you understand care options if your condition(s) worsen?: Yes-patient verbalized understanding    SIGNATURE Lewanda Rife, LPN

## 2023-09-22 ENCOUNTER — Ambulatory Visit: Payer: Managed Care, Other (non HMO) | Admitting: General Practice

## 2023-09-22 ENCOUNTER — Encounter: Payer: Self-pay | Admitting: General Practice

## 2023-09-22 ENCOUNTER — Ambulatory Visit: Payer: Self-pay | Admitting: Family Medicine

## 2023-09-22 VITALS — BP 132/84 | HR 55 | Temp 97.5°F | Ht 68.5 in | Wt 244.0 lb

## 2023-09-22 DIAGNOSIS — L0291 Cutaneous abscess, unspecified: Secondary | ICD-10-CM | POA: Diagnosis not present

## 2023-09-22 DIAGNOSIS — R197 Diarrhea, unspecified: Secondary | ICD-10-CM

## 2023-09-22 DIAGNOSIS — R110 Nausea: Secondary | ICD-10-CM | POA: Diagnosis not present

## 2023-09-22 LAB — COMPREHENSIVE METABOLIC PANEL
ALT: 13 U/L (ref 0–53)
AST: 21 U/L (ref 0–37)
Albumin: 4.2 g/dL (ref 3.5–5.2)
Alkaline Phosphatase: 60 U/L (ref 39–117)
BUN: 16 mg/dL (ref 6–23)
CO2: 27 meq/L (ref 19–32)
Calcium: 8.9 mg/dL (ref 8.4–10.5)
Chloride: 103 meq/L (ref 96–112)
Creatinine, Ser: 0.84 mg/dL (ref 0.40–1.50)
GFR: 95.7 mL/min (ref >60.00)
Glucose, Bld: 85 mg/dL (ref 70–99)
Potassium: 3.9 meq/L (ref 3.5–5.1)
Sodium: 138 meq/L (ref 135–145)
Total Bilirubin: 1 mg/dL (ref 0.2–1.2)
Total Protein: 6.7 g/dL (ref 6.0–8.3)

## 2023-09-22 LAB — CBC WITH DIFFERENTIAL/PLATELET
Basophils Absolute: 0 10*3/uL (ref 0.0–0.1)
Basophils Relative: 0.6 % (ref 0.0–3.0)
Eosinophils Absolute: 0.2 10*3/uL (ref 0.0–0.7)
Eosinophils Relative: 2.2 % (ref 0.0–5.0)
HCT: 44.9 % (ref 39.0–52.0)
Hemoglobin: 15.4 g/dL (ref 13.0–17.0)
Lymphocytes Relative: 32.8 % (ref 12.0–46.0)
Lymphs Abs: 2.4 10*3/uL (ref 0.7–4.0)
MCHC: 34.4 g/dL (ref 30.0–36.0)
MCV: 96.8 fL (ref 78.0–100.0)
Monocytes Absolute: 0.5 10*3/uL (ref 0.1–1.0)
Monocytes Relative: 6.8 % (ref 3.0–12.0)
Neutro Abs: 4.2 10*3/uL (ref 1.4–7.7)
Neutrophils Relative %: 57.6 % (ref 43.0–77.0)
Platelets: 213 10*3/uL (ref 150.0–400.0)
RBC: 4.64 Mil/uL (ref 4.22–5.81)
RDW: 11.9 % (ref 11.5–15.5)
WBC: 7.2 10*3/uL (ref 4.0–10.5)

## 2023-09-22 MED ORDER — ONDANSETRON 4 MG PO TBDP
ORAL_TABLET | ORAL | 0 refills | Status: DC
Start: 2023-09-22 — End: 2024-05-12

## 2023-09-22 MED ORDER — SULFAMETHOXAZOLE-TRIMETHOPRIM 800-160 MG PO TABS
1.0000 | ORAL_TABLET | Freq: Two times a day (BID) | ORAL | 0 refills | Status: AC
Start: 2023-09-22 — End: 2023-09-29

## 2023-09-22 NOTE — Telephone Encounter (Signed)
 Patient seen today in office

## 2023-09-22 NOTE — Patient Instructions (Addendum)
 Stop by the lab prior to leaving today. I will notify you of your results once received.   BRAT- banana, rice, apple sauce and toast.   Gatorade, pedialyte.   Refill sent for zofran .   Start Bactrim  1 tablet two time a day for seven days. Schedule follow up with Dr. Cleatus for next week. You can cancel if you are feeling better.

## 2023-09-22 NOTE — Telephone Encounter (Signed)
 Chief Complaint: diarrhea Symptoms: nausea, diarrhea, intermittent lower abdominal pain Frequency: x 24 hours Pertinent Negatives: Patient denies vomiting, fever, blood in stool, lightheaded, dry mouth. Disposition: [] ED /[] Urgent Care (no appt availability in office) / [x] Appointment(In office/virtual)/ []  [facility]/ [] Home Care/ [] Refused Recommended Disposition /[] [facility]/ []  Follow-up with PCP Additional Notes: Patient states he has had about 12 episodes of diarrhea since yesterday; he states he has not had an episode in the last 4 hours. Patient denies any signs of dehydration and reports hydrating with 3 18oz water bottles over the past 2 hours. Patient agreeable to office visit today, states he feels like this could be his diverticulitis flaring up.  Reason for Disposition  [1] SEVERE diarrhea (e.g., 7 or more times / day more than normal) AND [2] present > 24 hours (1 day)  Answer Assessment - Initial Assessment Questions 1. DIARRHEA SEVERITY: How bad is the diarrhea? How many more stools have you had in the past 24 hours than normal?    - NO DIARRHEA (SCALE 0)   - MILD (SCALE 1-3): Few loose or mushy BMs; increase of 1-3 stools over normal daily number of stools; mild increase in ostomy output.   -  MODERATE (SCALE 4-7): Increase of 4-6 stools daily over normal; moderate increase in ostomy output.   -  SEVERE (SCALE 8-10; OR WORST POSSIBLE): Increase of 7 or more stools daily over normal; moderate increase in ostomy output; incontinence.     Probably about 12, he states it has subsided some and has not had a bowel movement in the last 4 hours. He states his normal is 3 Bms per day.  2. ONSET: When did the diarrhea begin?      Yesterday morning.  3. BM CONSISTENCY: How loose or watery is the diarrhea?      Watery.  4. VOMITING: Are you also vomiting? If Yes, ask: How many times in the past 24 hours?      Denies.  5. ABDOMEN PAIN: Are  you having any abdomen pain? If Yes, ask: What does it feel like? (e.g., crampy, dull, intermittent, constant)      Yes, low in my colon, in my abdomen. Like a sharp cramp  6. ABDOMEN PAIN SEVERITY: If present, ask: How bad is the pain?  (e.g., Scale 1-10; mild, moderate, or severe)   - MILD (1-3): doesn't interfere with normal activities, abdomen soft and not tender to touch    - MODERATE (4-7): interferes with normal activities or awakens from sleep, abdomen tender to touch    - SEVERE (8-10): excruciating pain, doubled over, unable to do any normal activities       5/10.  7. ORAL INTAKE: If vomiting, Have you been able to drink liquids? How much liquids have you had in the past 24 hours?     Patient states he has been drinking 3 18 oz waters in the last 2 hours. He states he has been hydrating.  8. HYDRATION: Any signs of dehydration? (e.g., dry mouth [not just dry lips], too weak to stand, dizziness, new weight loss) When did you last urinate?     Denies signs of dehydration and states he is making urine.  9. EXPOSURE: Have you traveled to a foreign country recently? Have you been exposed to anyone with diarrhea? Could you have eaten any food that was spoiled?     Denies travel, exposure, or eating spoiled food.  10. ANTIBIOTIC USE: Are you taking antibiotics now  or have you taken antibiotics in the past 2 months?       Denies.  11. OTHER SYMPTOMS: Do you have any other symptoms? (e.g., fever, blood in stool)       Denies.  Protocols used: [facility]

## 2023-09-22 NOTE — Assessment & Plan Note (Signed)
 Suspect viral stomach virus as his symptoms have slightly improved today.   Discussed the importance of staying hydrated, BRAT diet, ER/UC precautions.   Discussed avoid the use of imodium.   Cbc with diff, CMP pending.  Follow up scheduled with Dr. Cleatus for Monday, may cancel if symptoms have improved.  Consider stool studies and/or imaging if diarrhea continues.

## 2023-09-22 NOTE — Assessment & Plan Note (Signed)
~  4 mm abscess located on upper mid back on right side.  Firm.   Small enough to not need I&D today.   Rx sent for Bactrim  for 7 days.  Allergy to benzonate (causes headaches), discussed with patient and has had bactrim  in the past. Discussed stopping bactrim  if develops headache.   Follow up with Dr. Cleatus if symptoms worsen or do not improve.

## 2023-09-22 NOTE — Progress Notes (Signed)
 Established Patient Office Visit  Subjective   Patient ID: Frank Strickland, male    DOB: 06/13/1966  Age: 58 y.o. MRN: [id number]  Chief Complaint  Patient presents with   Diarrhea    Lower abdominal pain and nausea x 24 hrs. Took his nausea meds and a pain pill for sx.    Cyst    On back x 3 weeks; has gotten very inflamed recently. Wife has opened it up at home and gotten some stuff out. Would like checked today.     HPI  Frank Strickland is a 58 year old male, patient of Dr. Cleatus, with past medical history of HTN, Aorta disorder, Hemorrhoids, GERD, diverticulitis, presents today for an acute visit.   Nausea: Symptom onset Monday afternoon with right lower abdominal pain and nausea. Then he developed diarrhea. He has been having about 12 watery BMs yesterday and last night. He feels very sore on his bottom. No blood in the stool. He reports that diarrhea is a little better and has had three watery BMs today. He still having the nausea. He took ondansetron  4 mg last night which give him some relief.   Cyst on mid right back: His wife noticed three weeks ago. His wife told him it was size of a quarter. She popped it and had yellow drainage. It is painful and itchy. No fever, chills. Does not recall any trauma or injury.   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   Advance care planning 12/10/2017   Wrist pain 10/06/2017   Positive ANA (antinuclear antibody) 02/05/2017   Rheumatoid factor positive 02/05/2017   Positive anti-CCP test 02/05/2017   Vitamin D  deficiency 07/02/2016   Erectile dysfunction 07/02/2016   GERD (gastroesophageal  reflux disease) 07/02/2016   Prostatitis 01/17/2016   Pulmonary nodule 09/18/2015   Diarrhea 05/14/2013   Routine general medical examination at a health care facility 08/17/2012   HLD (hyperlipidemia) 10/12/2007   Essential hypertension, benign 10/10/2007   Anxiety 02/22/2007   Allergy 02/22/2007   Past Medical History:  Diagnosis Date   Allergy, unspecified not elsewhere classified    Anxiety    Duodenal ulcer    2014   Hyperlipidemia    Hypertension    Allergies  Allergen Reactions   Ace Inhibitors    Angiotensin Receptor Blockers    Lisinopril  Other (See Comments)    Presumed cause of lip swelling   Morphine      Hallucination after surgery   Oxycodone      Lip swelling.     Amlodipine  Other (See Comments)    intolerant   Codeine Sulfate     REACTION: headache   Crestor  [Rosuvastatin  Calcium ]     Aches with 10mg  daily   Hydrocodone      swelling   Ketorolac Tromethamine     REACTION: severe headache   Omeprazole  Other (See Comments)    Muscle aches per patient    Pravastatin      aches   Tessalon  [Benzonatate ] Other (See Comments)    headache         09/22/2023    9:21 AM 05/06/2023    2:32 PM  01/05/2023   11:02 AM  Depression screen PHQ 2/9  Decreased Interest 0 0 0  Down, Depressed, Hopeless 0 0 1  PHQ - 2 Score 0 0 1  Altered sleeping 0 0 0  Tired, decreased energy 0 0 0  Change in appetite 0 0 0  Feeling bad or failure about yourself  0 0 0  Trouble concentrating 0 0 0  Moving slowly or fidgety/restless 0 0 0  Suicidal thoughts 0 0   PHQ-9 Score 0 0 1  Difficult doing work/chores Not difficult at all Not difficult at all Somewhat difficult       09/22/2023    9:21 AM 05/06/2023    2:32 PM  GAD 7 : Generalized Anxiety Score  Nervous, Anxious, on Edge 0 0  Control/stop worrying 0 0  Worry too much - different things 0 0  Trouble relaxing 0 1  Restless 0 0  Easily annoyed or irritable 0 0  Afraid - awful might happen 0 0  Total GAD 7 Score 0 1   Anxiety Difficulty Not difficult at all Not difficult at all      Review of Systems  Constitutional:  Negative for chills and fever.  Respiratory:  Negative for shortness of breath.   Cardiovascular:  Negative for chest pain.  Gastrointestinal:  Positive for abdominal pain, diarrhea and nausea. Negative for blood in stool, constipation, heartburn, melena and vomiting.  Genitourinary:  Negative for dysuria, frequency and urgency.  Skin:  Positive for itching.       Cyst on back.  Neurological:  Negative for dizziness and headaches.  Endo/Heme/Allergies:  Negative for polydipsia.  Psychiatric/Behavioral:  Negative for depression and suicidal ideas. The patient is not nervous/anxious.       Objective:     BP 132/84 (BP Location: Left Arm, Patient Position: Sitting, Cuff Size: Large)   Pulse (!) 55   Temp (!) 97.5 F (36.4 C) (Oral)   Ht 5' 8.5 (1.74 m)   Wt 244 lb (110.7 kg)   SpO2 97%   BMI 36.56 kg/m  BP Readings from Last 3 Encounters:  09/22/23 132/84  06/13/23 (!) 173/99  05/06/23 120/80   Wt Readings from Last 3 Encounters:  09/22/23 244 lb (110.7 kg)  06/13/23 229 lb 15 oz (104.3 kg)  05/06/23 230 lb (104.3 kg)      Physical Exam Vitals and nursing note reviewed.  Constitutional:      Appearance: Normal appearance.  Cardiovascular:     Rate and Rhythm: Normal rate and regular rhythm.     Pulses: Normal pulses.     Heart sounds: Normal heart sounds.  Pulmonary:     Effort: Pulmonary effort is normal.     Breath sounds: Normal breath sounds.  Abdominal:     General: Bowel sounds are normal. There is no distension.     Palpations: Abdomen is soft. There is no mass.     Tenderness: There is abdominal tenderness. There is no right CVA tenderness, left CVA tenderness, guarding or rebound.     Hernia: No hernia is present.  Skin:    General: Skin is warm.     Findings: Abscess present.          Comments: Abscess located on right mid back.    Neurological:     General: No focal deficit present.     Mental Status: He is alert and oriented to person, place, and time.  Psychiatric:        Mood  and Affect: Mood normal.        Behavior: Behavior normal.        Thought Content: Thought content normal.        Judgment: Judgment normal.      No results found for any visits on 09/22/23.     The 10-year ASCVD risk score (Arnett DK, et al., 2019) is: 10.1%    Assessment & Plan:  Nausea Assessment & Plan: Refill sent for Zofran . He is using it sparingly.  Orders: -     Ondansetron ; Allow 1-2 tablets to dissolve in your mouth every 8 hours as needed for nausea/vomiting  Dispense: 30 tablet; Refill: 0  Diarrhea, unspecified type Assessment & Plan: Suspect viral stomach virus as his symptoms have slightly improved today.   Discussed the importance of staying hydrated, BRAT diet, ER/UC precautions.   Discussed avoid the use of imodium.   Cbc with diff, CMP pending.  Follow up scheduled with Dr. Cleatus for Monday, may cancel if symptoms have improved.  Consider stool studies and/or imaging if diarrhea continues.   Orders: -     CBC with Differential/Platelet -     Comprehensive metabolic panel  Abscess Assessment & Plan: ~4 mm abscess located on upper mid back on right side.  Firm.   Small enough to not need I&D today.   Rx sent for Bactrim  for 7 days.  Allergy to benzonate (causes headaches), discussed with patient and has had bactrim  in the past. Discussed stopping bactrim  if develops headache.   Follow up with Dr. Cleatus if symptoms worsen or do not improve.   Orders: -     Sulfamethoxazole -Trimethoprim ; Take 1 tablet by mouth 2 (two) times daily for 7 days.  Dispense: 14 tablet; Refill: 0     Return in about 5 days (around 09/27/2023) for follow up on diarrhea and cyst. may cancel if better.SABRA Carrol Aurora, NP

## 2023-09-22 NOTE — Assessment & Plan Note (Signed)
Refill sent for Zofran. He is using it sparingly.

## 2023-09-27 ENCOUNTER — Ambulatory Visit: Payer: Managed Care, Other (non HMO) | Admitting: Family Medicine

## 2023-11-04 ENCOUNTER — Other Ambulatory Visit: Payer: Self-pay | Admitting: Family Medicine

## 2023-11-24 ENCOUNTER — Other Ambulatory Visit: Payer: Self-pay | Admitting: Family Medicine

## 2024-01-05 ENCOUNTER — Other Ambulatory Visit: Payer: Self-pay | Admitting: Family Medicine

## 2024-01-21 ENCOUNTER — Ambulatory Visit: Admitting: Family Medicine

## 2024-01-21 ENCOUNTER — Encounter: Payer: Self-pay | Admitting: Family Medicine

## 2024-01-21 VITALS — BP 124/72 | HR 70 | Temp 98.8°F | Ht 68.5 in | Wt 243.8 lb

## 2024-01-21 DIAGNOSIS — F419 Anxiety disorder, unspecified: Secondary | ICD-10-CM

## 2024-01-21 MED ORDER — FLUOXETINE HCL 20 MG PO CAPS: 20.0000 mg | ORAL_CAPSULE | Freq: Every day | ORAL | 3 refills | Status: AC

## 2024-01-21 NOTE — Progress Notes (Signed)
 Mood d/w pt.  He cut back to prozac  20mg  about 10 days ago.  He felt "draggy" on the medicine at the higher dose and didn't feel like he needed the higher dose now.  His mood was lower after knee surgery and that affected his mood.  He has worked through that in the meantime. The higher dose helped at the time, discussed.  No SI/HI.  He thought the 20mg  dose felt adequate now.  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  I specifically thanked him for his effort with work.  Meds, vitals, and allergies reviewed.   ROS: Per HPI unless specifically indicated in ROS section   Nad Ncat Neck supple, no LA Rrr Ctab Abd soft.  Nontender. Skin well-perfused.

## 2024-01-21 NOTE — Patient Instructions (Signed)
 Thanks for your effort.  Take care.  Glad to see you. I would keep going with prozac  20mg .  Update me as needed.

## 2024-01-23 NOTE — Assessment & Plan Note (Signed)
 Discussed options. I would keep going with prozac  20mg .  Update me as needed.  He agrees with plan.

## 2024-02-26 ENCOUNTER — Other Ambulatory Visit: Payer: Self-pay

## 2024-02-26 ENCOUNTER — Emergency Department
Admission: EM | Admit: 2024-02-26 | Discharge: 2024-02-26 | Disposition: A | Discharge status: Home / Self Care | Attending: Emergency Medicine | Admitting: Emergency Medicine

## 2024-02-26 DIAGNOSIS — T7840XA Allergy, unspecified, initial encounter: Secondary | ICD-10-CM

## 2024-02-26 DIAGNOSIS — T63461A Toxic effect of venom of wasps, accidental (unintentional), initial encounter: Secondary | ICD-10-CM | POA: Diagnosis present

## 2024-02-26 DIAGNOSIS — R220 Localized swelling, mass and lump, head: Secondary | ICD-10-CM | POA: Insufficient documentation

## 2024-02-26 DIAGNOSIS — R221 Localized swelling, mass and lump, neck: Secondary | ICD-10-CM | POA: Diagnosis not present

## 2024-02-26 MED ORDER — EPINEPHRINE 0.3 MG/0.3ML IJ SOAJ: 0.3000 mg | INTRAMUSCULAR | 1 refills | Status: AC | PRN

## 2024-02-26 MED ORDER — FAMOTIDINE IN NACL 20-0.9 MG/50ML-% IV SOLN
20.0000 mg | Freq: Once | INTRAVENOUS | Status: AC
  Administered 2024-02-26: 20 mg via INTRAVENOUS
  Filled 2024-02-26: qty 50

## 2024-02-26 MED ORDER — PREDNISONE 50 MG PO TABS: 50.0000 mg | ORAL_TABLET | Freq: Every day | ORAL | 0 refills | Status: AC

## 2024-02-26 MED ORDER — METHYLPREDNISOLONE SODIUM SUCC 125 MG IJ SOLR
125.0000 mg | Freq: Once | INTRAMUSCULAR | Status: AC
  Administered 2024-02-26: 125 mg via INTRAVENOUS
  Filled 2024-02-26: qty 2

## 2024-02-26 NOTE — ED Triage Notes (Signed)
 Pt to ed from home via POV for yellow jacket stings. Pt has one to the face with eye and neck swelling. Pt is caox4, in no acute distress and ambulatory in triage. Pt took some PO benadryl at home (50mg ). Pt has no airway involvement at this time, no resp complaints.

## 2024-02-26 NOTE — Discharge Instructions (Addendum)
 Please continue taking Benadryl for the next 3 days every 8 hours

## 2024-02-26 NOTE — ED Notes (Signed)
 Pt provided ice water and blankets.

## 2024-02-26 NOTE — ED Provider Notes (Signed)
 [facility] Provider Note   Event Date/Time   First MD Initiated Contact with Patient 02/26/24 2028     (approximate) History  Allergic Reaction (Yellow jackets)  HPI Frank Strickland is a 58 y.o. male with no known allergies who presents complaining of multiple insect stings today.  Patient believes that they were yellow jackets.  Patient has been stung in the face well as the trunk and right lower extremity.  Patient took 50 mg of Benadryl prior to arrival due to facial swelling and surrounding erythema.  Patient denies any airway involvement, shortness of breath, stridor, or difficulty swallowing. ROS: Patient currently denies any vision changes, tinnitus, difficulty speaking, facial droop, sore throat, chest pain, shortness of breath, abdominal pain, nausea/vomiting/diarrhea, dysuria, or weakness/numbness/paresthesias in any extremity   Physical Exam  Triage Vital Signs: ED Triage Vitals [02/26/24 2015]  Encounter Vitals Group     BP (!) 150/90     Girls Systolic BP Percentile      Girls Diastolic BP Percentile      Boys Systolic BP Percentile      Boys Diastolic BP Percentile      Pulse Rate (!) 58     Resp 16     Temp 98.8 F (37.1 C)     Temp Source Oral     SpO2 98 %     Weight      Height 5' 8 (1.727 m)     Head Circumference      Peak Flow      Pain Score 10     Pain Loc      Pain Education      Exclude from Growth Chart    Most recent vital signs: Vitals:   02/26/24 2125 02/26/24 2142  BP:    Pulse: (!) 53 (!) 56  Resp: 18   Temp:    SpO2: 97% 98%   General: Awake, oriented x4. CV:  Good peripheral perfusion. Resp:  Normal effort. Abd:  No distention. Other:  Middle-aged obese Caucasian male resting comfortably in no acute distress.  Significant erythema and swelling to the left periorbital region ED Results / Procedures / Treatments  Labs (all labs ordered are listed, but only abnormal results are displayed) Labs Reviewed - No  data to display PROCEDURES: Critical Care performed: No Procedures MEDICATIONS ORDERED IN ED: Medications  methylPREDNISolone  sodium succinate (SOLU-MEDROL ) 125 mg/2 mL injection 125 mg (125 mg Intravenous Given 02/26/24 2044)  famotidine  (PEPCID ) IVPB 20 mg premix (0 mg Intravenous Stopped 02/26/24 2125)   IMPRESSION / MDM / ASSESSMENT AND PLAN / ED COURSE  I reviewed the triage vital signs and the nursing notes.                             The patient is on the cardiac monitor to evaluate for evidence of arrhythmia and/or significant heart rate changes. Patient's presentation is most consistent with acute presentation with potential threat to life or bodily function. + Cutaneous hives/erythema No evidence of multiorgan involvement  Given history and exam, presentation most consistent with allergic reaction. I have low suspicion for toxic shock syndrome, anaphylaxis, asthma exacerbation, or drug toxicity. Rx: Prednisone  60mg  qday x3days, Benadryl 25mg  q8hr x3days Disposition: Discharge home with SRP. Follow up with PCP in 1-2 days.   FINAL CLINICAL IMPRESSION(S) / ED DIAGNOSES   Final diagnoses:  Allergic reaction, initial encounter  Wasp sting, accidental or unintentional, initial encounter  Rx / DC Orders   ED Discharge Orders          Ordered    EPINEPHrine  0.3 mg/0.3 mL IJ SOAJ injection  As needed        02/26/24 2228    predniSONE  (DELTASONE ) 50 MG tablet  Daily with breakfast        02/26/24 2229           Note:  This document was prepared using Dragon voice recognition software and may include unintentional dictation errors.   Kiandra Sanguinetti K, MD 02/27/24 [phone]

## 2024-02-26 NOTE — ED Notes (Signed)
 Pt given ice packs to put on yellow jacket bites.

## 2024-03-02 ENCOUNTER — Telehealth: Payer: Self-pay

## 2024-03-02 ENCOUNTER — Ambulatory Visit: Payer: Self-pay

## 2024-03-02 NOTE — Telephone Encounter (Signed)
 FYI Only or Action Required?: Action required by provider: request for appointment.  Patient was last seen in primary care on 01/21/2024 by Frank Arlyss RAMAN, MD.  Called Nurse Triage reporting Insect Bite.  Symptoms began several days ago.  Interventions attempted: Prescription medications: Prednisone .  Symptoms are: gradually improving. Pt. Seen in ED after being stung 25 times. States swelling is better but still tired. 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  Triage Disposition: See PCP When Office is Open (Within 3 Days)  Patient/caregiver understands and will follow disposition?:      Copied from CRM [id number]. Topic: Clinical - Red Word Triage >> Mar 02, 2024 12:38 PM [name] wrote: Red Word that prompted transfer to Nurse Triage: patient called in and stated he was seen in the ER , he was stung over 20 times by yellow jackets , said it was all on the left side.. the itching with pain has got a lot worse and said his mood swings as well are getting worse because he feels like he's full of poison. Been taking a steroid to try to help get the poison out Answer Assessment - Initial Assessment Questions 1. TYPE: What type of sting was it? (e.g., bee, yellow jacket, unknown)      Yellow jackets 2. ONSET: When did it occur?      02/26/24 3. LOCATION: Where is the sting located?  How many stings?     25 4. SWELLING SIZE: How big is the swelling? (e.g., inches or cm)     Face, arms and legs - swelling has gone down 5. REDNESS: Is the area red or pink? If Yes, ask: What size is area of redness? (e.g., inches or cm). When did the redness start?     no 6. PAIN: Is there any pain? If Yes, ask: How bad is it?  (Scale 0-10; or none, mild, moderate, severe)     soreness 7. ITCHING: Is there any itching? If Yes, ask: How bad is it?      yes 8. RESPIRATORY DISTRESS: Describe your breathing.     No issues 9. PRIOR REACTIONS: Have you had any severe allergic reactions to  stings in the past? If Yes, ask: What happened?     no 10. OTHER SYMPTOMS: Do you have any other symptoms? (e.g., abdomen pain, face or tongue swelling, new rash elsewhere, vomiting)       Headache -mild     Fatigue 11. PREGNANCY: Is there any chance you are pregnant? When was your last menstrual period?       N/a  Protocols used: Bee or Yellow Jacket Sting-A-AH  Reason for Disposition  [1] Hives, itching, or swelling elsewhere on body (i.e., not a site of stings) AND [2] started over 2 hours after sting  (Exception: Only at site of sting.)  Answer Assessment - Initial Assessment Questions 1. TYPE: What type of sting was it? (e.g., bee, yellow jacket, unknown)      Yellow jackets 2. ONSET: When did it occur?      02/26/24 3. LOCATION: Where is the sting located?  How many stings?     25 4. SWELLING SIZE: How big is the swelling? (e.g., inches or cm)     Face, arms and legs - swelling has gone down 5. REDNESS: Is the area red or pink? If Yes, ask: What size is area of redness? (e.g., inches or cm). When did the redness start?     no 6. PAIN: Is there any pain? If  Yes, ask: How bad is it?  (Scale 0-10; or none, mild, moderate, severe)     soreness 7. ITCHING: Is there any itching? If Yes, ask: How bad is it?      yes 8. RESPIRATORY DISTRESS: Describe your breathing.     No issues 9. PRIOR REACTIONS: Have you had any severe allergic reactions to stings in the past? If Yes, ask: What happened?     no 10. OTHER SYMPTOMS: Do you have any other symptoms? (e.g., abdomen pain, face or tongue swelling, new rash elsewhere, vomiting)       Headache -mild     Fatigue 11. PREGNANCY: Is there any chance you are pregnant? When was your last menstrual period?       N/a  Protocols used: Bee or Yellow Jacket Sting-A-AH

## 2024-03-02 NOTE — Transitions of Care (Post Inpatient/ED Visit) (Signed)
 Unable to reach patient by phone and left v/m requesting call back at [phone].     03/02/2024  Name: Frank Strickland MRN: [id number] DOB: 12-17-1965  Today's TOC FU Call Status: Today's TOC FU Call Status:: Unsuccessful Call (1st Attempt) Unsuccessful Call (1st Attempt) Date: 03/02/24  Attempted to reach the patient regarding the most recent Inpatient/ED visit.  Follow Up Plan: Additional outreach attempts will be made to reach the patient to complete the Transitions of Care (Post Inpatient/ED visit) call.   Signature  Laray Arenas, LPN

## 2024-03-02 NOTE — Telephone Encounter (Signed)
 Please see about getting patient scheduled whenever possible.   He could have mood swings from the prednisone  use. If that is the case, and if done with prednisone , then sx should start to improve soon.

## 2024-03-02 NOTE — Telephone Encounter (Signed)
 Please contact patient to schedule to see someone in clinic. Thank you

## 2024-03-06 NOTE — Telephone Encounter (Signed)
 Called patient he states he spoke with the provider who did his knee replacements.  He states he is feeling better and back at work.  The side effects are over from the medication.  Declined to schedule an appointment at this time.

## 2024-04-06 ENCOUNTER — Ambulatory Visit: Payer: Self-pay

## 2024-04-06 NOTE — Telephone Encounter (Signed)
     FYI Only or Action Required?: FYI only for provider.  Patient was last seen in primary care on 01/21/2024 by Cleatus Arlyss RAMAN, MD.  Called Nurse Triage reporting Flank Pain.  Symptoms began yesterday.  Interventions attempted: Nothing.  Symptoms are: gradually worsening.  Triage Disposition: Go to ED Now (Notify PCP)  Patient/caregiver understands and will follow disposition?: Yes Copied from CRM [id number]. Topic: Clinical - Red Word Triage >> Apr 06, 2024 10:24 AM Frank Strickland wrote: Red Word that prompted transfer to Nurse Triage: past weekend, patient sneezed and felt like he cracked something on his left side. Patient sneezed again today and is feeling severe pain again. Reason for Disposition  [1] SEVERE pain (e.g., excruciating, scale 8-10) AND [2] present > 1 hour  Answer Assessment - Initial Assessment Questions 1. LOCATION: Where does it hurt? (e.g., left, right)     Left side 2. ONSET: When did the pain start?     This past weekend, sneezed and felt something pop on left side 3. SEVERITY: How bad is the pain? (e.g., Scale 1-10; mild, moderate, or severe)     severe 4. PATTERN: Does the pain come and go, or is it constant?      Comes and goes 5. CAUSE: What do you think is causing the pain?     unknown 6. OTHER SYMPTOMS:  Do you have any other symptoms? (e.g., fever, abdomen pain, vomiting, leg weakness, burning with urination, blood in urine)     no 7. PREGNANCY:  Is there any chance you are pregnant? When was your last menstrual period?     na  Protocols used: Flank Pain-A-AH

## 2024-04-06 NOTE — Telephone Encounter (Signed)
 Noted. Thanks.

## 2024-04-12 ENCOUNTER — Other Ambulatory Visit: Payer: Self-pay | Admitting: Family Medicine

## 2024-04-12 DIAGNOSIS — I10 Essential (primary) hypertension: Secondary | ICD-10-CM

## 2024-04-12 NOTE — Telephone Encounter (Signed)
 E-scribed refill  Plz schedule CPE and fasting labs for additional refills.

## 2024-04-23 ENCOUNTER — Other Ambulatory Visit: Payer: Self-pay | Admitting: Family Medicine

## 2024-04-23 DIAGNOSIS — I10 Essential (primary) hypertension: Secondary | ICD-10-CM

## 2024-05-05 ENCOUNTER — Other Ambulatory Visit (INDEPENDENT_AMBULATORY_CARE_PROVIDER_SITE_OTHER): Payer: Self-pay

## 2024-05-05 DIAGNOSIS — I10 Essential (primary) hypertension: Secondary | ICD-10-CM

## 2024-05-05 LAB — LIPID PANEL
Cholesterol: 226 mg/dL — ABNORMAL HIGH (ref 0–200)
HDL: 43 mg/dL (ref >39.00)
LDL Cholesterol: 152 mg/dL — ABNORMAL HIGH (ref 0–99)
NonHDL: 182.79
Total CHOL/HDL Ratio: 5
Triglycerides: 154 mg/dL — ABNORMAL HIGH (ref 0.0–149.0)
VLDL: 30.8 mg/dL (ref 0.0–40.0)

## 2024-05-05 LAB — COMPREHENSIVE METABOLIC PANEL WITH GFR
ALT: 15 U/L (ref 0–53)
AST: 21 U/L (ref 0–37)
Albumin: 4.3 g/dL (ref 3.5–5.2)
Alkaline Phosphatase: 52 U/L (ref 39–117)
BUN: 21 mg/dL (ref 6–23)
CO2: 28 meq/L (ref 19–32)
Calcium: 8.9 mg/dL (ref 8.4–10.5)
Chloride: 103 meq/L (ref 96–112)
Creatinine, Ser: 0.89 mg/dL (ref 0.40–1.50)
GFR: 93.64 mL/min (ref >60.00)
Glucose, Bld: 89 mg/dL (ref 70–99)
Potassium: 4.2 meq/L (ref 3.5–5.1)
Sodium: 138 meq/L (ref 135–145)
Total Bilirubin: 1 mg/dL (ref 0.2–1.2)
Total Protein: 6.6 g/dL (ref 6.0–8.3)

## 2024-05-07 ENCOUNTER — Ambulatory Visit: Payer: Self-pay | Admitting: Family Medicine

## 2024-05-10 ENCOUNTER — Other Ambulatory Visit: Payer: Self-pay | Admitting: Family Medicine

## 2024-05-11 NOTE — Telephone Encounter (Signed)
 LOV: 01/21/24 NOV: 05/12/24 Last Refill: traMADol  (ULTRAM ) 50 MG tablet 05/06/2023 30 tablets 1 refill

## 2024-05-12 ENCOUNTER — Encounter: Payer: Self-pay | Admitting: Family Medicine

## 2024-05-12 ENCOUNTER — Ambulatory Visit (INDEPENDENT_AMBULATORY_CARE_PROVIDER_SITE_OTHER): Admitting: Family Medicine

## 2024-05-12 VITALS — BP 126/78 | HR 61 | Temp 98.8°F | Ht 68.47 in | Wt 249.6 lb

## 2024-05-12 DIAGNOSIS — I10 Essential (primary) hypertension: Secondary | ICD-10-CM

## 2024-05-12 DIAGNOSIS — Z0000 Encounter for general adult medical examination without abnormal findings: Secondary | ICD-10-CM

## 2024-05-12 DIAGNOSIS — M25569 Pain in unspecified knee: Secondary | ICD-10-CM

## 2024-05-12 DIAGNOSIS — Z7189 Other specified counseling: Secondary | ICD-10-CM

## 2024-05-12 DIAGNOSIS — R110 Nausea: Secondary | ICD-10-CM

## 2024-05-12 DIAGNOSIS — F419 Anxiety disorder, unspecified: Secondary | ICD-10-CM

## 2024-05-12 MED ORDER — ONDANSETRON 4 MG PO TBDP
ORAL_TABLET | ORAL | 0 refills | Status: AC
Start: 2024-05-12 — End: ?

## 2024-05-12 MED ORDER — HYDROCHLOROTHIAZIDE 25 MG PO TABS
ORAL_TABLET | ORAL | 3 refills | Status: AC
Start: 2024-05-12 — End: ?
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
## 2024-05-12 NOTE — Telephone Encounter (Addendum)
 Please check with patient about this.  I thought he was using gabapentin  as needed and not tramadol . He has a hx of knee pain and I can send this if needed.  I didn't see this request until after the OV today.  Thanks.

## 2024-05-12 NOTE — Patient Instructions (Addendum)
 Let me see about options on heart imaging.  Take care.  Glad to see you. Flu shot today.

## 2024-05-14 ENCOUNTER — Telehealth: Payer: Self-pay | Admitting: Family Medicine

## 2024-05-14 NOTE — Assessment & Plan Note (Signed)
Continue as needed gabapentin
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
## 2024-05-14 NOTE — Assessment & Plan Note (Signed)
Living will d/w pt.   Would have his wife designated if patient were incapacitated.  

## 2024-05-14 NOTE — Assessment & Plan Note (Signed)
 Tetanus shot done 2020 Flu 2025 PNA d/w pt.   Shingles prev done.   covid vaccine done Living will d/w pt. Would have his wife designated if patient were incapacitated.  Prostate cancer screening and PSA options (with potential risks and benefits of testing vs not testing) were discussed along with recent recs/guidelines.  He declined testing PSA at this point. Colonoscopy 2021 HAV and HBV vaccine prev done through work.   HIV and HCV screening done ~08/17/2008  with [facility]. He is working on diet and exercise.

## 2024-05-14 NOTE — Assessment & Plan Note (Signed)
 Labs d/w pt.  Statin intolerant.   Continue work on diet and exercise.  Continue hydrochlorothiazide .

## 2024-05-14 NOTE — Telephone Encounter (Signed)
 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  He is statin intolerant and we were considering repeating this when we talked at a recent office visit.  I thought it was reasonable to consider but I wanted your input. Many thanks.

## 2024-05-16 NOTE — Telephone Encounter (Signed)
 FYI Spoke with patient and he is going to call back. He is pretty sure that its just the gabapentin  and not the tramadol . But he will look at his medication when he arrives home and send a mychart message

## 2024-05-17 NOTE — Telephone Encounter (Signed)
 Noted. Thanks.  I'll await the mychart message.

## 2024-05-21 NOTE — Telephone Encounter (Signed)
 Please update patient.  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  He thought that repeating the CT calcium  scoring at 10 years would be reasonable.  I think it would be okay to push this back to 2029, assuming he is feeling well.  Please let me know about his thoughts.  Thanks.

## 2024-05-22 NOTE — Telephone Encounter (Signed)
 Left voicemail for patient to return call to office.  If patients calls back you can provide the information from Dr. Cleatus

## 2024-05-23 NOTE — Telephone Encounter (Signed)
 Left voicemail for patient to return call to office.  If patients calls back you can provide the information from Dr. Cleatus
